# Patient Record
Sex: Male | Born: 1955 | Race: White | Hispanic: No | Marital: Married | Smoking: Never smoker
Health system: Southern US, Community
[De-identification: ages and names within clinical notes are randomized; demographics above are authoritative.]

## PROBLEM LIST (undated history)

## (undated) DIAGNOSIS — E785 Hyperlipidemia, unspecified: Secondary | ICD-10-CM

## (undated) DIAGNOSIS — I2510 Atherosclerotic heart disease of native coronary artery without angina pectoris: Secondary | ICD-10-CM

## (undated) DIAGNOSIS — R001 Bradycardia, unspecified: Secondary | ICD-10-CM

## (undated) DIAGNOSIS — G4730 Sleep apnea, unspecified: Secondary | ICD-10-CM

## (undated) DIAGNOSIS — I509 Heart failure, unspecified: Secondary | ICD-10-CM

## (undated) DIAGNOSIS — K219 Gastro-esophageal reflux disease without esophagitis: Secondary | ICD-10-CM

## (undated) DIAGNOSIS — Z9989 Dependence on other enabling machines and devices: Secondary | ICD-10-CM

## (undated) DIAGNOSIS — I10 Essential (primary) hypertension: Secondary | ICD-10-CM

## (undated) DIAGNOSIS — T7840XA Allergy, unspecified, initial encounter: Secondary | ICD-10-CM

## (undated) DIAGNOSIS — G4733 Obstructive sleep apnea (adult) (pediatric): Secondary | ICD-10-CM

## (undated) DIAGNOSIS — I219 Acute myocardial infarction, unspecified: Secondary | ICD-10-CM

## (undated) DIAGNOSIS — Z8719 Personal history of other diseases of the digestive system: Secondary | ICD-10-CM

## (undated) DIAGNOSIS — E7800 Pure hypercholesterolemia, unspecified: Secondary | ICD-10-CM

## (undated) HISTORY — PX: CORONARY ARTERY BYPASS GRAFT: SHX141

## (undated) HISTORY — DX: Personal history of other diseases of the digestive system: Z87.19

## (undated) HISTORY — DX: Atherosclerotic heart disease of native coronary artery without angina pectoris: I25.10

## (undated) HISTORY — PX: APPENDECTOMY: SHX54

## (undated) HISTORY — DX: Bradycardia, unspecified: R00.1

## (undated) HISTORY — DX: Heart failure, unspecified: I50.9

## (undated) HISTORY — DX: Obstructive sleep apnea (adult) (pediatric): G47.33

## (undated) HISTORY — DX: Gastro-esophageal reflux disease without esophagitis: K21.9

## (undated) HISTORY — DX: Allergy, unspecified, initial encounter: T78.40XA

## (undated) HISTORY — PX: CARDIAC CATHETERIZATION: SHX172

## (undated) HISTORY — DX: Pure hypercholesterolemia, unspecified: E78.00

## (undated) HISTORY — DX: Obstructive sleep apnea (adult) (pediatric): Z99.89

## (undated) HISTORY — PX: TONSILLECTOMY: SHX5217

## (undated) HISTORY — DX: Sleep apnea, unspecified: G47.30

## (undated) HISTORY — DX: Hyperlipidemia, unspecified: E78.5

## (undated) HISTORY — DX: Essential (primary) hypertension: I10

## (undated) HISTORY — DX: Acute myocardial infarction, unspecified: I21.9

## (undated) HISTORY — PX: SHOULDER SURGERY: SHX246

---

## 2001-06-07 ENCOUNTER — Encounter: Admission: RE | Admit: 2001-06-07 | Discharge: 2001-06-07 | Payer: Self-pay | Admitting: Infectious Diseases

## 2001-06-14 ENCOUNTER — Encounter: Admission: RE | Admit: 2001-06-14 | Discharge: 2001-06-14 | Payer: Self-pay | Admitting: Infectious Diseases

## 2001-06-21 ENCOUNTER — Encounter: Admission: RE | Admit: 2001-06-21 | Discharge: 2001-06-21 | Payer: Self-pay | Admitting: Infectious Diseases

## 2004-10-23 HISTORY — PX: KNEE SURGERY: SHX244

## 2006-10-23 DIAGNOSIS — I219 Acute myocardial infarction, unspecified: Secondary | ICD-10-CM

## 2006-10-23 HISTORY — DX: Acute myocardial infarction, unspecified: I21.9

## 2007-04-22 ENCOUNTER — Ambulatory Visit: Payer: Self-pay | Admitting: Internal Medicine

## 2007-06-14 ENCOUNTER — Ambulatory Visit (HOSPITAL_BASED_OUTPATIENT_CLINIC_OR_DEPARTMENT_OTHER): Admission: RE | Admit: 2007-06-14 | Discharge: 2007-06-14 | Payer: Self-pay | Admitting: Internal Medicine

## 2007-06-22 ENCOUNTER — Ambulatory Visit: Payer: Self-pay | Admitting: Internal Medicine

## 2007-06-26 ENCOUNTER — Ambulatory Visit: Payer: Self-pay | Admitting: Internal Medicine

## 2007-07-28 ENCOUNTER — Inpatient Hospital Stay (HOSPITAL_COMMUNITY): Admission: EM | Admit: 2007-07-28 | Discharge: 2007-08-07 | Payer: Self-pay | Admitting: Emergency Medicine

## 2007-07-29 ENCOUNTER — Encounter: Payer: Self-pay | Admitting: Cardiovascular Disease

## 2007-07-29 ENCOUNTER — Ambulatory Visit: Payer: Self-pay | Admitting: Thoracic Surgery (Cardiothoracic Vascular Surgery)

## 2007-08-01 ENCOUNTER — Encounter: Payer: Self-pay | Admitting: Thoracic Surgery (Cardiothoracic Vascular Surgery)

## 2007-08-19 ENCOUNTER — Ambulatory Visit: Payer: Self-pay | Admitting: Thoracic Surgery (Cardiothoracic Vascular Surgery)

## 2007-08-19 ENCOUNTER — Encounter
Admission: RE | Admit: 2007-08-19 | Discharge: 2007-08-19 | Payer: Self-pay | Admitting: Thoracic Surgery (Cardiothoracic Vascular Surgery)

## 2007-08-27 ENCOUNTER — Ambulatory Visit: Payer: Self-pay | Admitting: Internal Medicine

## 2007-08-29 ENCOUNTER — Encounter (HOSPITAL_COMMUNITY): Admission: RE | Admit: 2007-08-29 | Discharge: 2007-10-23 | Payer: Self-pay | Admitting: Cardiovascular Disease

## 2007-10-15 ENCOUNTER — Encounter: Payer: Self-pay | Admitting: Internal Medicine

## 2007-10-24 ENCOUNTER — Encounter (HOSPITAL_COMMUNITY): Admission: RE | Admit: 2007-10-24 | Discharge: 2008-01-22 | Payer: Self-pay | Admitting: Cardiovascular Disease

## 2007-10-25 HISTORY — PX: US ECHOCARDIOGRAPHY: HXRAD669

## 2007-11-26 DIAGNOSIS — J3089 Other allergic rhinitis: Secondary | ICD-10-CM

## 2007-11-26 DIAGNOSIS — E785 Hyperlipidemia, unspecified: Secondary | ICD-10-CM | POA: Insufficient documentation

## 2007-11-26 DIAGNOSIS — I2510 Atherosclerotic heart disease of native coronary artery without angina pectoris: Secondary | ICD-10-CM | POA: Insufficient documentation

## 2007-11-26 DIAGNOSIS — J302 Other seasonal allergic rhinitis: Secondary | ICD-10-CM | POA: Insufficient documentation

## 2007-11-26 DIAGNOSIS — G4733 Obstructive sleep apnea (adult) (pediatric): Secondary | ICD-10-CM | POA: Insufficient documentation

## 2007-11-26 DIAGNOSIS — I10 Essential (primary) hypertension: Secondary | ICD-10-CM

## 2007-11-27 ENCOUNTER — Ambulatory Visit: Payer: Self-pay | Admitting: Internal Medicine

## 2008-04-09 ENCOUNTER — Ambulatory Visit: Payer: Self-pay | Admitting: Internal Medicine

## 2008-08-07 ENCOUNTER — Telehealth: Payer: Self-pay | Admitting: Internal Medicine

## 2008-09-01 ENCOUNTER — Ambulatory Visit (HOSPITAL_COMMUNITY): Admission: RE | Admit: 2008-09-01 | Discharge: 2008-09-01 | Payer: Self-pay | Admitting: Orthopaedic Surgery

## 2008-09-03 IMAGING — CR DG CHEST 1V PORT
1 series · 1 of 1 positions shown · non-contrast
Comparison: 07/28/07

CLINICAL DATA: Unstable angina, rule out heart failure. 
 PORTABLE CHEST ? 1 VIEW ? 3708 hours:

[view not recorded]
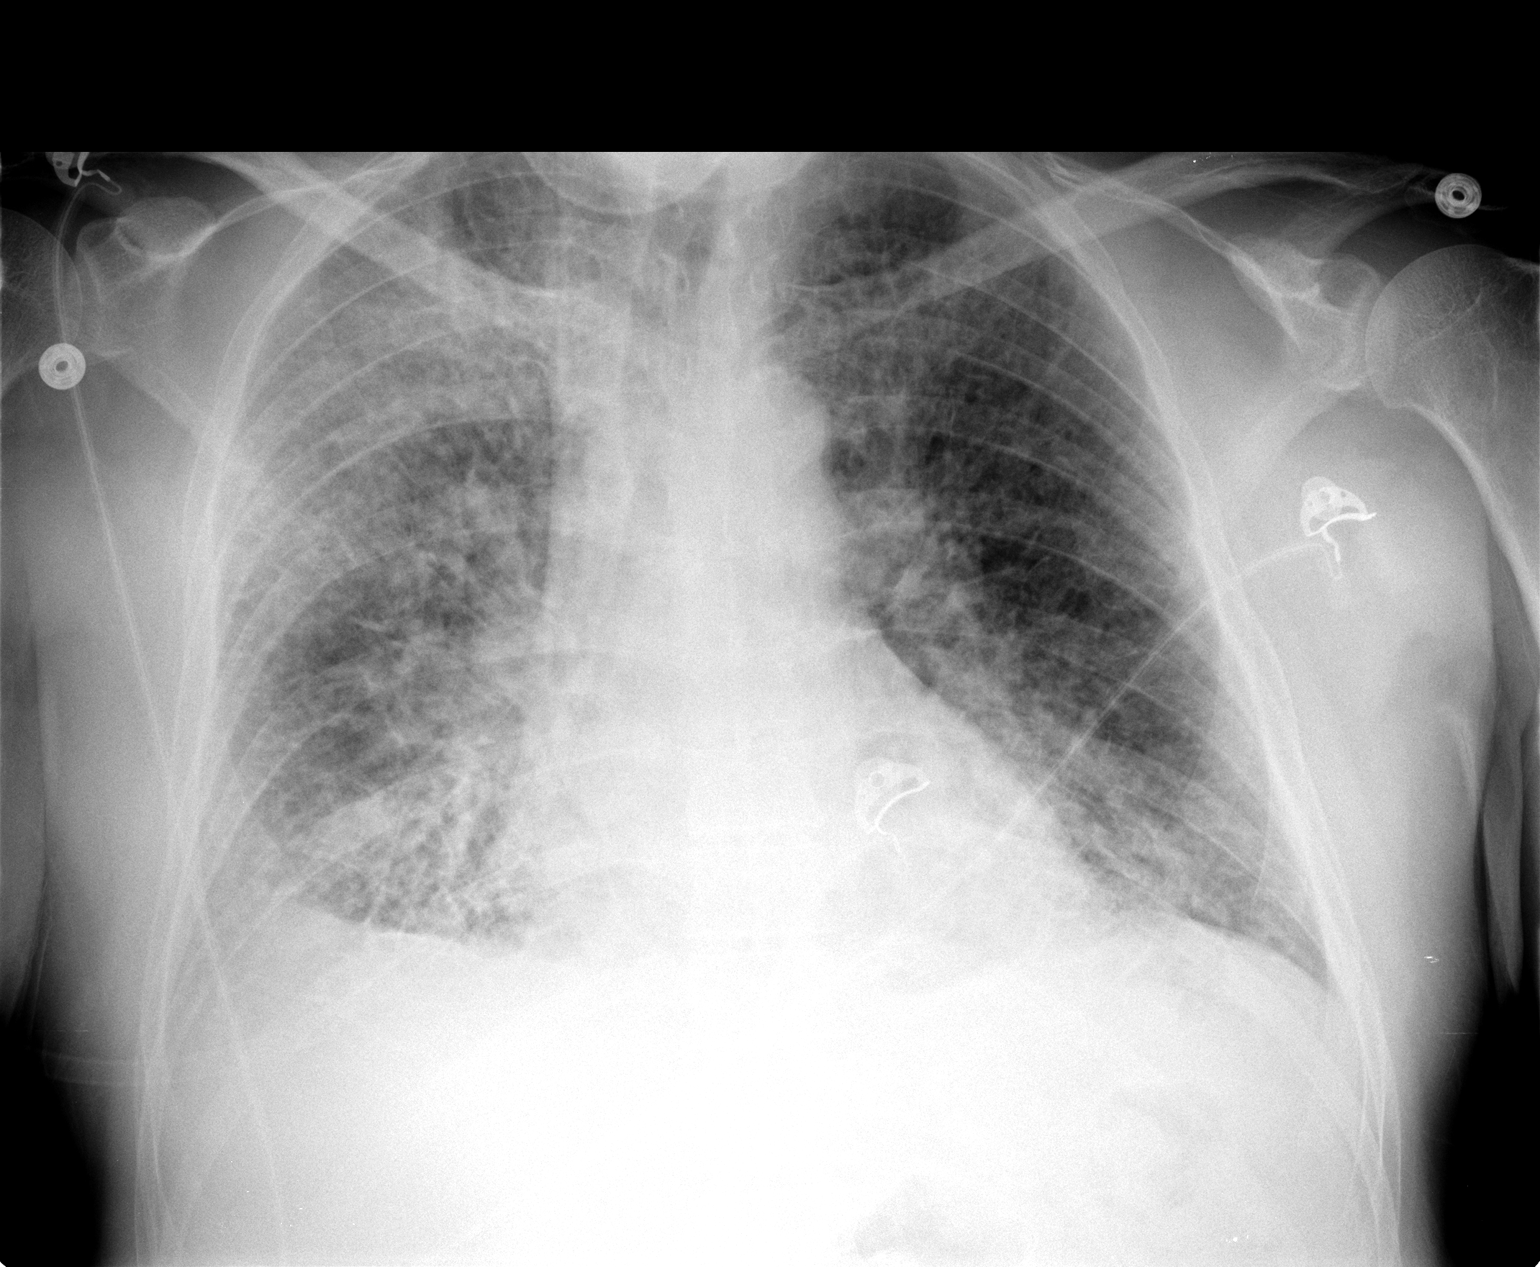

[1 of 1 positions shown; findings below may reference images not displayed]

FINDINGS: Artifact overlies the chest.  The patient has interstitial and alveolar pulmonary edema asymmetrically more pronounced on the right.  There are small pleural effusions bilaterally.
IMPRESSION: Acute pulmonary edema.  Small pleural effusions.

## 2008-09-04 IMAGING — CR DG CHEST 1V PORT
1 series · 1 of 1 positions shown · non-contrast
Comparison: One day prior

CLINICAL DATA: Unstable angina. Rule out CHF.

CHEST - 1 VIEW

[view not recorded]
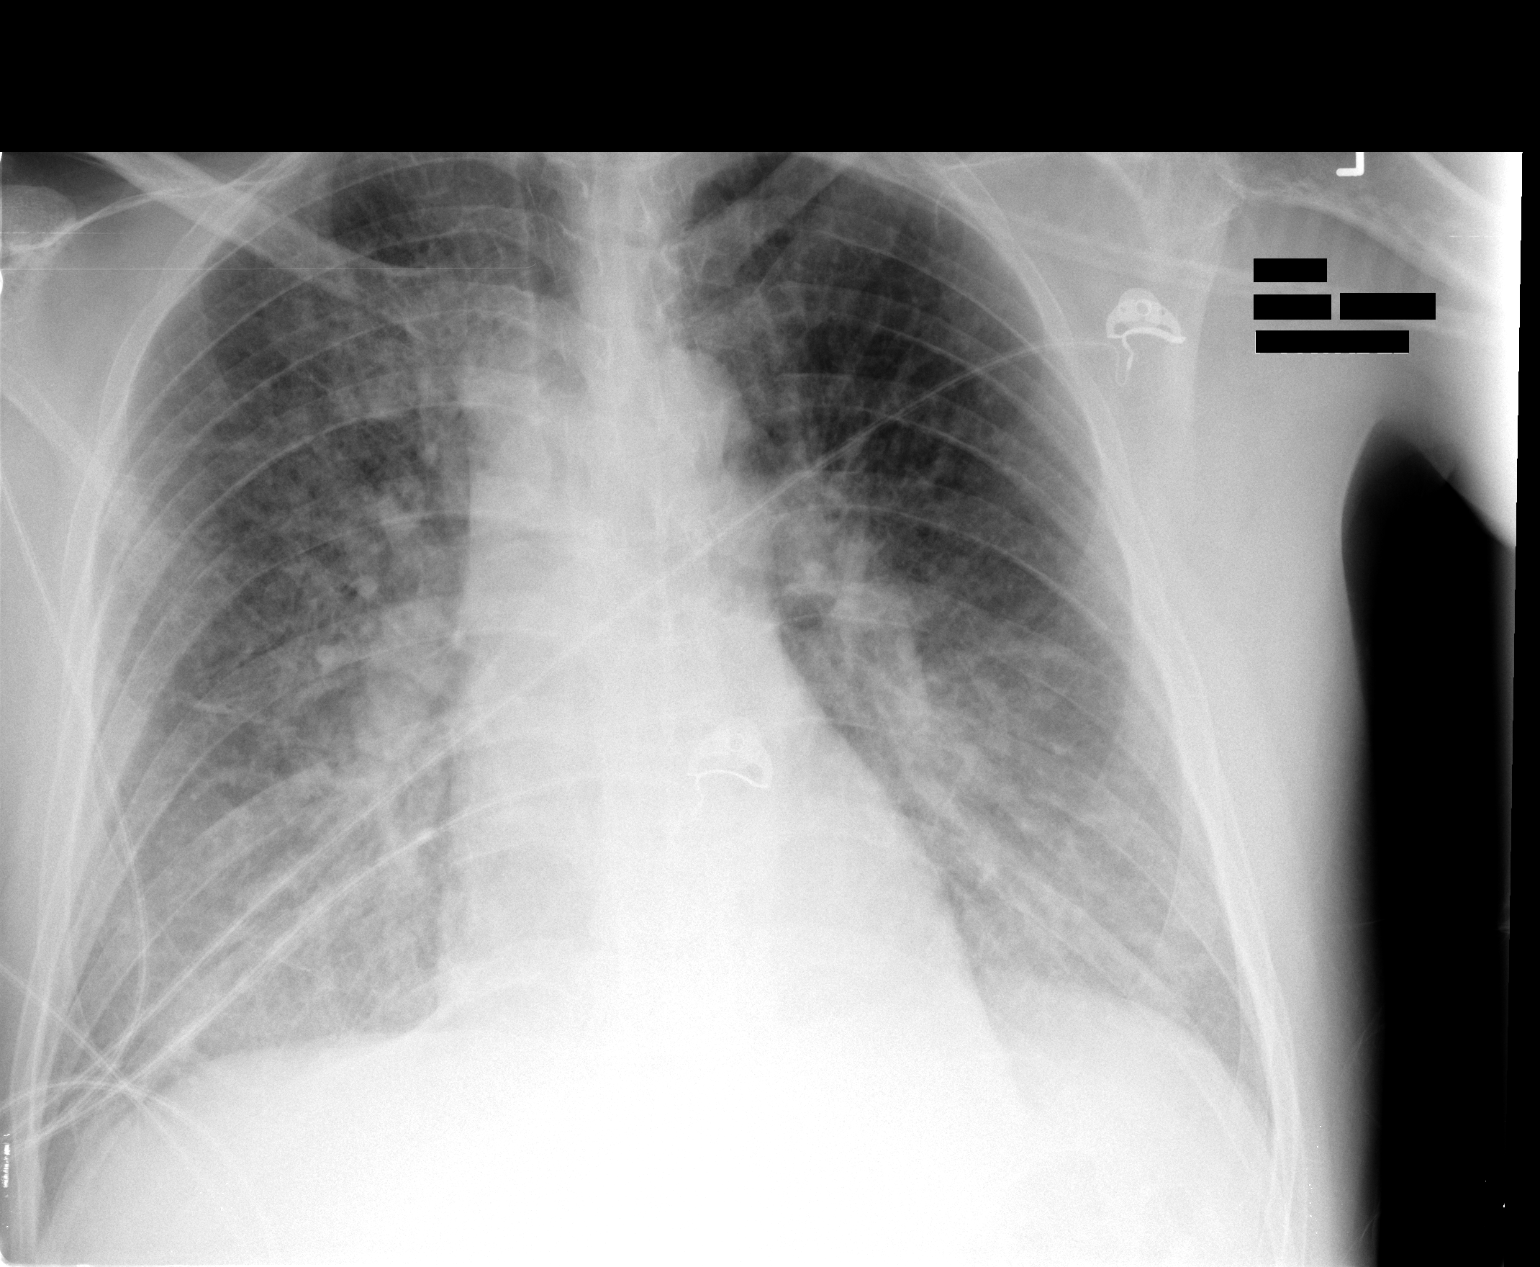

[1 of 1 positions shown; findings below may reference images not displayed]

FINDINGS: Midline trachea. Normal heart size and mediastinal contours. Probable
small layering bilateral pleural effusions. No pneumothorax.

Improved mild interstitial edema. Minimal left base airspace disease is likely
atelectasis.

IMPRESSION

1. Slight improvement in now mild interstitial edema.
2. Probable layering bilateral pleural effusions.
3. Minimal left base airspace disease, likely atelectasis.

## 2008-09-05 IMAGING — CR DG CHEST 2V
2 series · 2 of 2 positions shown · non-contrast
Comparison: 07/31/07.

CLINICAL DATA: Pre-CABG. History of MI. Now with shortness of breath.
 CHEST - 2 VIEW:

[w chest pa]
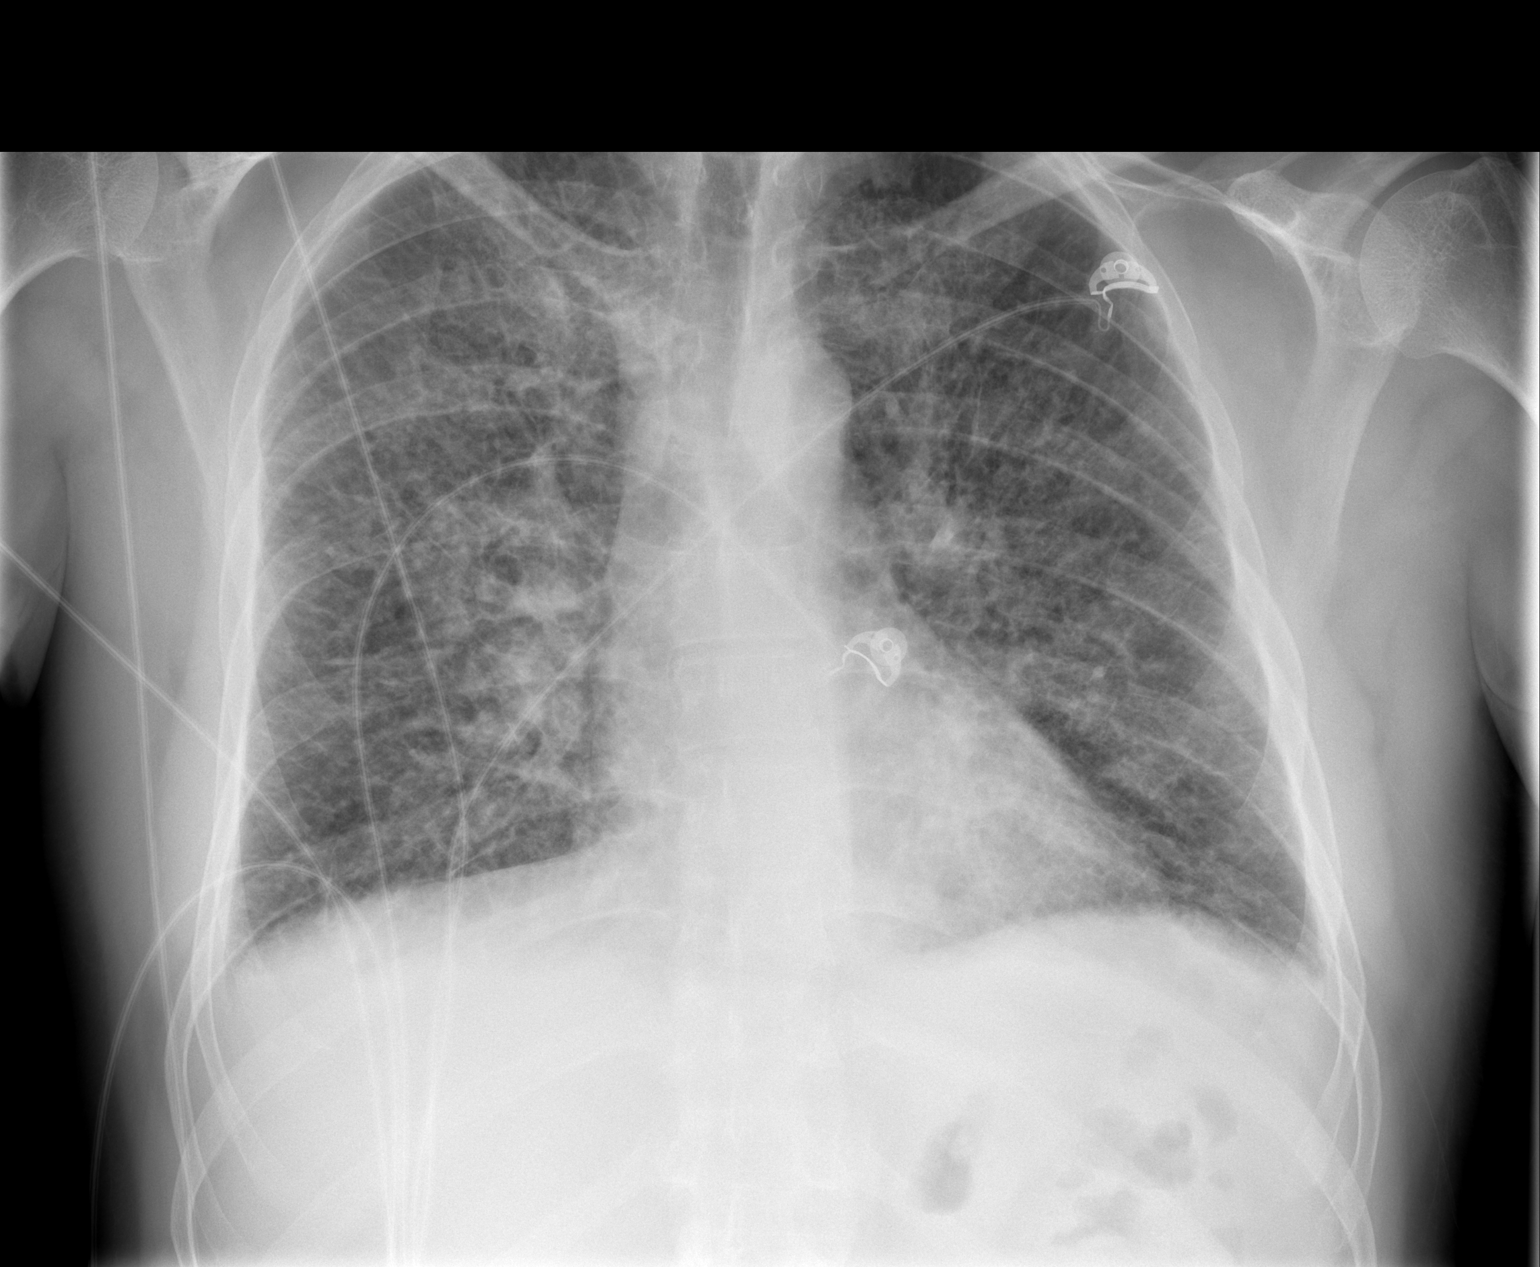

[w chest lat]
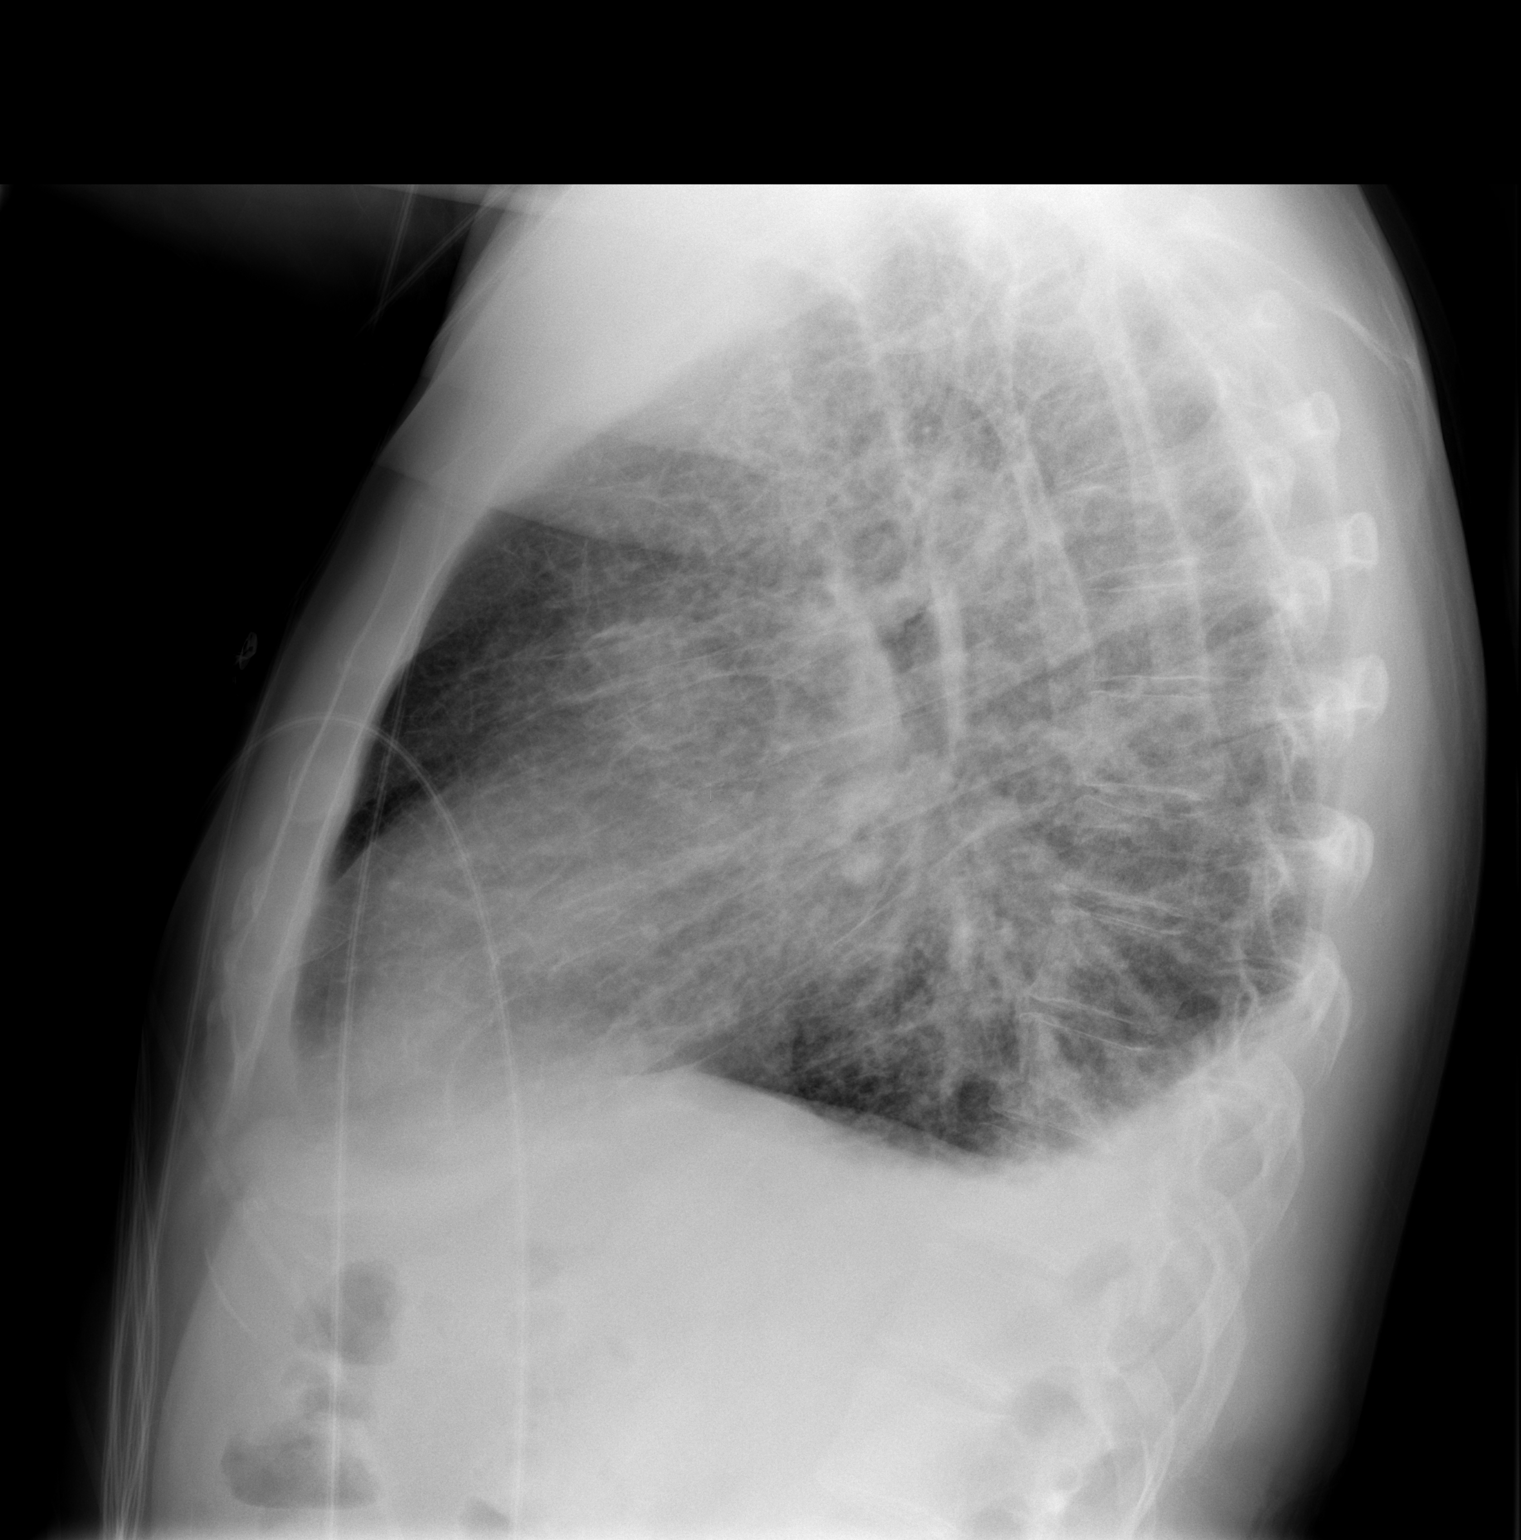

[2 of 2 positions shown; findings below may reference images not displayed]

FINDINGS: Heart size is normal. 
 There are small to moderate-sized bilateral pleural effusions. 
 Interstitial edema pattern has increased in the interval.
IMPRESSION: Worsening congestive heart failure.

## 2008-09-05 IMAGING — CR DG CHEST 1V PORT
1 series · 1 of 1 positions shown · non-contrast
Comparison: 08/01/2007, 07/31/2007

CLINICAL DATA: 51-year-old male, unstable angina.  Status post coronary
bypass.  Congestive heart failure. 
PORTABLE CHEST - 08/01/2007:

[view not recorded]
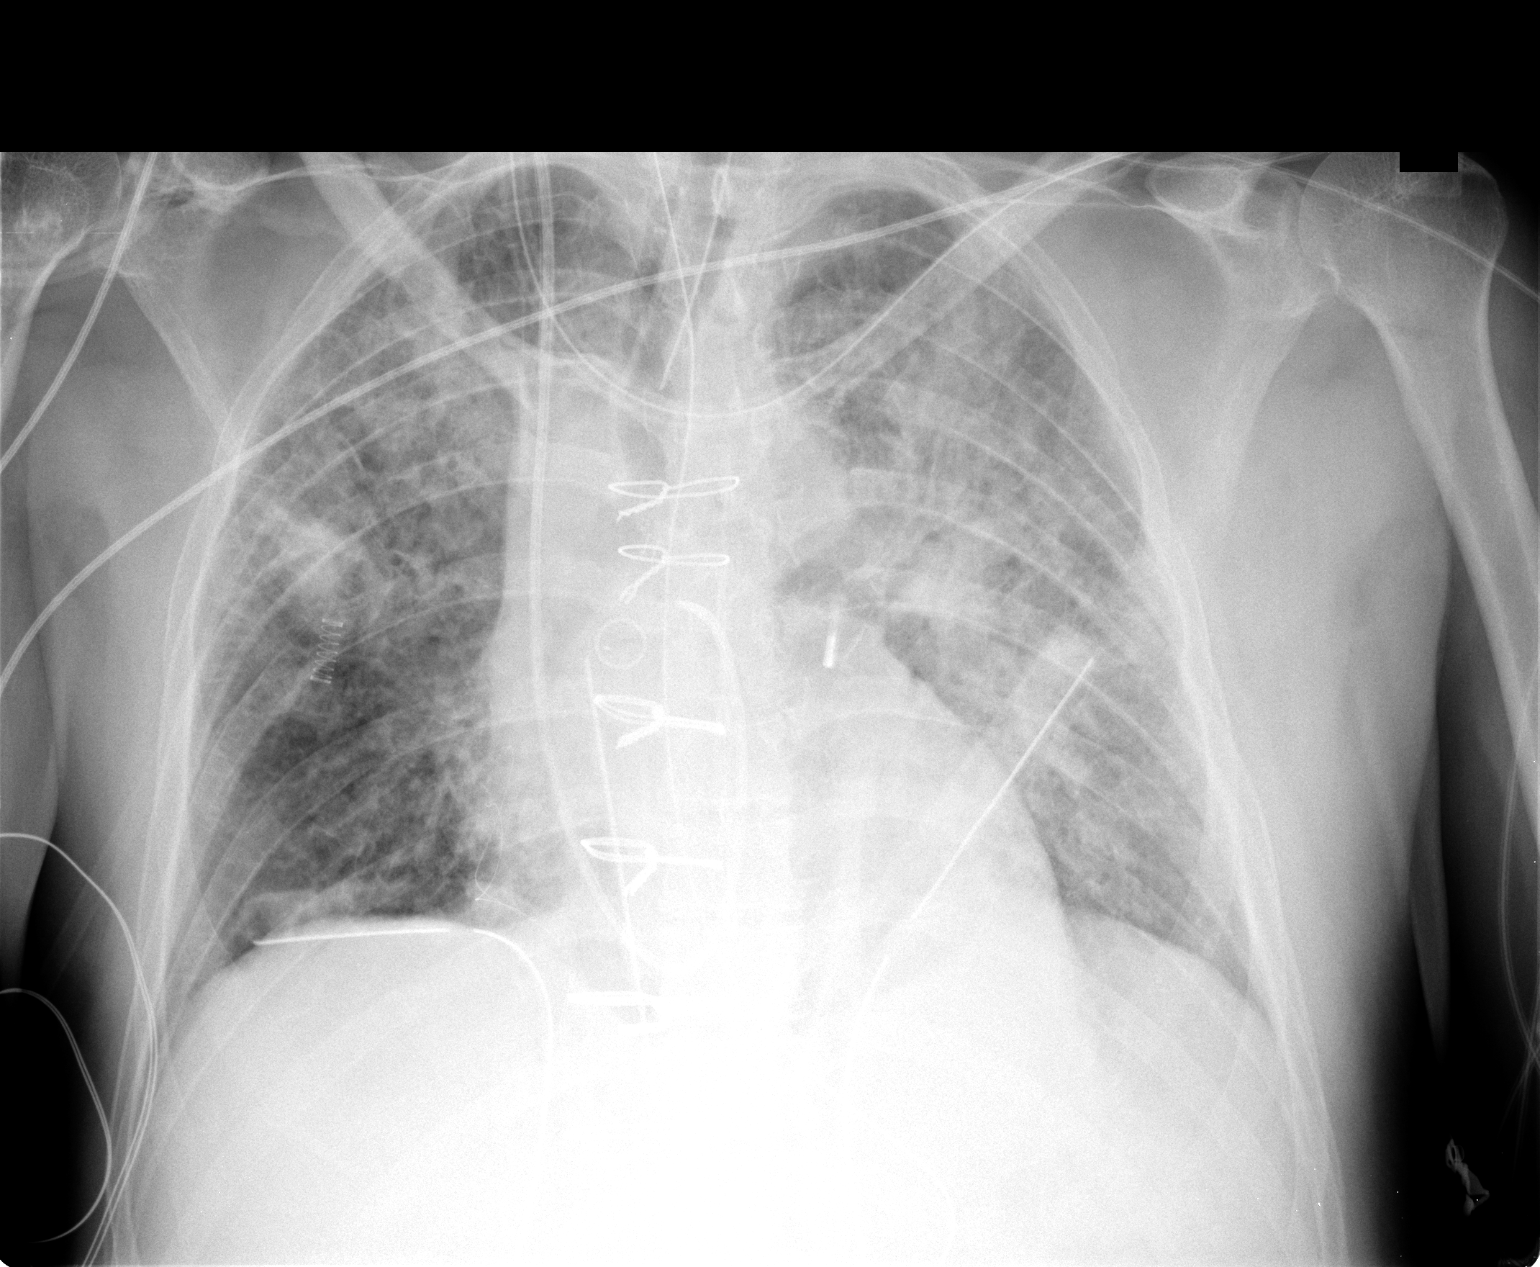

[1 of 1 positions shown; findings below may reference images not displayed]

FINDINGS: Endotracheal tube is 4.6 cm above the carina.  Swan-Ganz catheter tip is in the pulmonary outflow tract in the midline directed to the right. Mediastinal drains and chest tubes are noted.  NG tube enters the stomach with the tip not visualized.  Intraaortic balloon pump radiopaque marker is in the descending thoracic aorta.  Heart is enlarged with slight worsening airspace disease versus edema.  CHF is favored.  No large effusion or pneumothorax.
IMPRESSION: 1.  Support apparatus in good position.  
2.  Slight worsening CHF.

## 2008-10-09 ENCOUNTER — Ambulatory Visit: Payer: Self-pay | Admitting: Internal Medicine

## 2010-08-03 ENCOUNTER — Ambulatory Visit: Payer: Self-pay | Admitting: Cardiovascular Disease

## 2010-11-13 ENCOUNTER — Encounter: Payer: Self-pay | Admitting: Thoracic Surgery (Cardiothoracic Vascular Surgery)

## 2011-03-07 NOTE — Cardiovascular Report (Signed)
[name], [name] NO.:  [id number]   MEDICAL RECORD NO.:  [id number]          PATIENT TYPE:  INP   LOCATION:  2913                         FACILITY:  [facility]   PHYSICIAN:  Vesta Mixer, M.D. DATE OF BIRTH:  01/11/1956   DATE OF PROCEDURE:  07/29/2007  DATE OF DISCHARGE:                            CARDIAC CATHETERIZATION   [name] is a 55 year old gentleman who was seen in the office on  Friday with 2 weeks of intermittent arm pain.  He was scheduled to have  a stress test as an outpatient.  He presented on October 5 with sudden  onset of chest discomfort and arm pain.  He was found to have EKG  changes consistent with an anterior wall myocardial infarction.  He  became largely pain free on heparin, Integrilin and nitroglycerin.   He is referred for heart catheterization and for further evaluation.   PROCEDURE:  The procedure was left heart catheterization with coronary  angiography.   The right femoral artery was easily cannulated using a modified  Seldinger technique.   HEMODYNAMIC RESULTS:  The LV pressures were 88/34 with an aortic  pressure of 89/68.   ANGIOGRAPHY:  Left main.  The left main is essentially normal.   The left anterior descending artery has a proximal occlusion.  The the  plaque in the thrombus extends back into the left main.  The mid and  distal LAD filled, via right-to-left collaterals.   Left circumflex artery is a moderate-to-small sized vessel.  There  appears to be some ostial stenosis of about 60% at the origin.  The  first obtuse marginal artery has an 80% discrete stenosis at its origin.  The remainder of the circumflex artery has minor luminal irregularities.   The right coronary artery is large and dominant.  The posterior  descending artery is normal.  The posterolateral segment artery is  normal.   There are fairly good collateral vessels that extend up through the  septal branches to fill the mid and distal  LAD.   Left ventriculogram was performed in a 30 RAO position.  It reveals  anteroapical akinesis.  The ejection fraction is 30% to 35% percent.   COMPLICATIONS:  None.   CONCLUSIONS:  1. Coronary artery disease.  He has a 100% occluded left anterior      descending artery.  The plaque and the thrombus extends back into      the left main, and I worry that proceeding with angioplasty may      cause some disruption of this plaque, and pulling it back down to      into the circumflex artery.  He does have collateral      filling of the LAD, and I suspect that this territory still viable.      We will consult the surgeons for consideration for coronary artery      bypass grafting.   His left ventricular end-diastolic pressure is elevated.  We will give  him some diuresis.           ______________________________  Vesta Mixer, M.D.     PJN/MEDQ  D:  07/29/2007  T:  07/29/2007  Job:  [id number]   cc:   Salvatore Decent. Dorris Fetch, M.D.

## 2011-03-07 NOTE — Assessment & Plan Note (Signed)
[facility]                             PULMONARY OFFICE NOTE   NAME:CAPRANathan, Moctezuma                     MRN:          [id number]  DATE:08/27/2007                            DOB:          [birth year]    PROBLEMS:  1. Obstructive sleep apnea.  2. Elevated cholesterol.  3. Hypertension.  4. Seasonal rhinitis.  5. Coronary disease/infarction/bypass.   HISTORY:  He had started CPAP at 9 CWP and was getting comfortable with  that and then developed chest pain. He had an MI with pulmonary edema  and bypass graft. Since surgery, he has not been able to put his CPAP  back on because on incisional sternal discomfort, which we discussed. He  is about to start cardiac rehab.   MEDICATIONS:  1. Lipitor 80 mg.  2. Potassium 20 mEq.  3. Protonix 40 mg.  4. Coreg 3.125 mg.  5. Lasix 40 mg.  6. Altace 2.5 mg.  7. Iron 325 mg b.i.d.  8. Aspirin 325 mg.   ALLERGIES:  No medication allergy.   OBJECTIVE:  Weight 184 pounds, blood pressure 102/60, pulse 95, room air  saturation 100%. He is alert. Breathing is unlabored. Nasal airway is  not obstructed. Pulse is regular without murmur.   IMPRESSION:  Obstructive sleep apnea. He needs to get back on CPAP and  this was discussed. We may need to turn the pressure down a little bit  until his sternum feels more solid.   PLAN:  He will resume CPAP as able at 9 CWP. Schedule return 3 months.  Flu vaccine discussed and given.     Clinton D. Maple Hudson, MD, Tonny Bollman, FACP  Electronically Signed    CDY/MedQ  DD: 08/31/2007  DT: 09/01/2007  Job #: [id number]   cc:   Talmadge Coventry, M.D.  Vesta Mixer, M.D.

## 2011-03-07 NOTE — H&P (Signed)
[name], [name] NO.:  [id number]   MEDICAL RECORD NO.:  [id number]          PATIENT TYPE:  INP   LOCATION:  2913                         FACILITY:  [facility]   PHYSICIAN:  Cassell Clement, M.D. DATE OF BIRTH:  [birth year]   DATE OF ADMISSION:  07/28/2007  DATE OF DISCHARGE:                              HISTORY & PHYSICAL   CHIEF COMPLAINT:  Chest pain.   HISTORY:  This is a 55 year old Caucasian male admitted with chest pain.  The patient began having intermittent chest pain about 2 weeks ago.  He  had a bad episode of chest pain 4 days ago while walking the dog.  Two  days ago, July 26, 2007, he saw Dr. Elease Hashimoto in the office, and  electrocardiogram at that time was normal, but he was scheduled to have  a nuclear stress test this coming week.  However, yesterday morning he  began having chest pain and it lasted off and on all day, and last night  he did not sleep at all because of severe chest pain.  The patient did  not have any nausea or vomiting but did have bilateral arm paresthesias.  Because of persistence of the pain, the patient allowed his wife to  drive him to the emergency room after she got home from church today,  and in the emergency room his electrocardiogram showed new ST-segment  changes suggestive of a recent anteroseptal myocardial infarction.  In  the emergency room, he was given 4 baby aspirin.  The pain has eased  since arrival in the emergency room.  Cardiac enzymes in the emergency  room are markedly elevated, including a total CK of 1648, CK-MB of 216,  troponin of 11.38.  His SGOT is also elevated at 157.  Electrolytes are  slightly abnormal, with sodium of 133, potassium 3.3, BUN of 15,  creatinine 0.97.   SOCIAL HISTORY:  Reveals that the patient is married.  He has 1 child.  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  The patient has  never smoked, and he rarely drinks any alcohol.   PAST MEDICAL HISTORY:  Reveals  that he has a history of elevated  cholesterol and has been on Lipitor or 40 mg daily for the past year.   PAST SURGICAL HISTORY:  1. History of tonsillectomy and appendectomy prior to his moving to      the [place] from [place] at age 73.   ALLERGIES:  He has no known drug allergies.   REVIEW OF SYSTEMS:  GASTROINTESTINAL:  he does have a history of  frequent heartburn and uses Protonix p.r.n.  Bowel movements are  generally normal, but has had no bowel movement for the past 48 hours.  He has had a very poor appetite for the past 48 hours, and he has eaten  very little.  GENITOURINARY:  Reveals no bladder symptoms.  RESPIRATORY:  Reveals that he does use a CPAP machine at home for sleep  apnea.  He had a history of pneumonia 15 years ago.  He does not have  any history of arthritis.  He does have  a history of exertional chest  pain, as noted.  Interestingly, he was seen for palpitations in May 2008  and had a treadmill test then which apparently was normal.   FAMILY HISTORY:  His father died at age 73 of a heart attack.  Mother is  living at age 53 and lives in [place] and has no heart disease.  The  patient has several siblings, none of whom have heart problems.   HOME MEDICATIONS:  1. Lipitor 40 mg daily.  2. Protonix 40 mg p.r.n.  3. CPAP machine.  4. He does not take a daily aspirin.   PHYSICAL EXAMINATION:  VITAL SIGNS:  Blood pressure is 114/64, pulse 75  and regular, respirations are normal.  SKIN:  Color is somewhat sallow and pale.  Skin warm and dry.  HEAD AND NECK:  No scleral icterus.  Pupils equal and reactive.  Mouth  and pharynx normal.  Jugular venous pressure normal.  Carotids normal.  CHEST:  Clear to percussion and auscultation.  HEART:  Reveals no murmur, gallop, rub, or click.  ABDOMEN:  Soft and nontender.  No masses.  EXTREMITIES:  Show good pedal pulses, and no phlebitis or edema.   His EKG shows normal sinus rhythm, with evidence of a recent   anteroseptal MI.  Chest x-ray shows normal heart size and clear lung  fields.   IMPRESSION:  1. Recent anteroseptal myocardial infarction.  2. History of hypercholesterolemia.   DISPOSITION:  The patient is being admitted to the coronary care unit as  a recent myocardial infarction which occurred probably 24 hours ago.  Enzymes are significantly elevated.  The patient will be treated with IV  nitroglycerin, IV heparin, IV Integrilin, and aspirin.  He will also be  on beta blockers and statin therapy.  We will anticipate cardiac  catheterization Monday morning by Dr. Kristeen Miss.           ______________________________  Cassell Clement, M.D.     TB/MEDQ  D:  07/28/2007  T:  07/29/2007  Job:  [id number]   cc:   Talmadge Coventry, M.D.  Vesta Mixer, M.D.

## 2011-03-07 NOTE — Consult Note (Signed)
[name], [name]            ACCOUNT NO.:  [id number]   MEDICAL RECORD NO.:  [id number]          PATIENT TYPE:  INP   LOCATION:  2913                         FACILITY:  [facility]   PHYSICIAN:  Salvatore Decent. Dorris Fetch, M.D.DATE OF BIRTH:  1956/09/29   DATE OF CONSULTATION:  DATE OF DISCHARGE:                                 CONSULTATION   HISTORY OF PRESENT ILLNESS:  [name] is a 55 year old gentleman  originally from [place] who recently moved here from [place], [place].  He currently works as a Arboriculturist at [employer]. He has a  family history of coronary disease and a history of  hypercholesterolemia, but no prior cardiac history.  He had been having  intermittent chest pain over the past 2 weeks. He had seen Dr. Elease Hashimoto in  the office and was scheduled have a nuclear stress test this week.  However, Saturday night he slept poorly secondary to chest pain which  had been occurring on off all day on Saturday.  Sunday morning, he still  had the pain and also was complaining of some bilateral arm  paresthesias.  He came to the emergency room on the afternoon of July 28, 2007.  The EKG showed new ST abnormalities consistent with a recent  anteroseptal MI.  He was given aspirin.  His pain subsided.  He ruled in  for a myocardial infarction with a CPK of 1648, an MB of 216, and a  troponin of 11.38.  His troponin eventually rose to 23.  Today, he  underwent cardiac catheterization, where he was found to have a totally  occluded LAD.  There was some filling via right-to-left collaterals.  He  had a large dominant right coronary which did not have any significant  stenoses.  His left circumflex had a 70% ostial stenosis and an 80%  stenosis at the ostium of obtuse marginal 1.  He was noted to have an  elevated LVEDP at 34.  His ejection fraction was 30%, with anterior  apical akinesis.  The patient has been pain free since catheterization.   PAST MEDICAL HISTORY:  1.  Hypercholesterolemia.  2. Tonsillectomy.  3. Appendectomy.  4. Indigestion.   MEDICATIONS AT THE TIME OF ADMISSION:  1. Protonix 40 mg daily.  2. Lipitor 40 mg daily.   He has no known drug allergies.   FAMILY HISTORY:  Significant for father dying of a heart attack at age  29.   SOCIAL HISTORY:  He is married, with one child.  He had does not smoke.  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   REVIEW OF SYSTEMS:  He has had frequent heartburn.  Poor appetite since  Friday.  He had pneumonia 15 years ago, and uses CPAP.  He has no  history of blood clots, no history of claudication.  No stroke or TIA  symptoms.  No history of excessive bleeding or bruising.  All other  systems were negative.   PHYSICAL EXAMINATION:  GENERAL:  [name] is a 55 year old white male  in no acute distress.  VITAL SIGNS:  His blood pressure  is 100/60, pulse is 72 and regular,  respirations are 16.  GENERAL:  He is well developed and well nourished.  NEUROLOGIC:  He is alert and oriented x3.  He is appropriate and grossly  intact.  HEENT:  Unremarkable.  NECK:  Supple, without thyromegaly, adenopathy, or bruits.  CARDIAC:  Regular rate and rhythm.  Normal S1 and S2.  There is no S4.  There is no murmur.  LUNGS:  Clear anteriorly, with no wheezing.  ABDOMEN:  Soft, nontender.  EXTREMITIES:  Without clubbing, cyanosis, or edema.  He has 2+ pulses  throughout.  He has a normal Allen's test on the right.  He is left  handed.  SKIN:  Warm and dry.   LABORATORY DATA:  His hematocrit is 38, white count 12.1, platelets 235.  Sodium 136, potassium 3.4, BUN and creatinine 13 and 0.9, glucose is  123.  PT was 14.4, with an INR of 1.1, PTT was greater than 200 on  heparin.  His total bilirubin is 2.2, which is elevated.  AST is  elevated at 162.  Albumin is 3.2.  His peak CPK is 1689, peak MB is  153.7, peak troponin 23.9.  His cholesterol is 128, LDL 72, HDL was 46,  triglycerides were  50.  BNP was 1034.   Carotid Dopplers showed no evidence of ICA stenosis.  Normal Allen's  test bilaterally.   IMPRESSION:  [name] is a 55 year old gentleman who presented with an  out-of-hospital MI.  At catheterization, he has a total occlusion of his  LAD.  He also has some disease in his left circumflex system.  He has an  ejection fraction of 30%, with anteroapical akinesis.  He has had a  major infarction judging by enzymes as well as the amount of myocardium  which is nonfunctional on catheterization.  This is complicated by  congestive heart failure with elevated LVEDP and elevated BNP.  He  currently is pain free on Integrilin, heparin, and nitroglycerin.   I agree with Dr. Elease Hashimoto that [name] would benefit from coronary  artery bypass grafting to revascularize the LAD territory.  There is no  doubt there is significant scar, but there is likely some viable  myocardium there as well.  In addition, he would need revascularization  of his left circumflex distribution.  He has had a completed MI and  likely has a significant area of stunned myocardium in addition to the  infarcted myocardium.  He would certainly benefit from an interval to  allow some recovery of the stunned myocardium prior to coronary bypass  grafting if his symptoms per minute.  I have discussed with Mr. [name]  the indications, risks, benefits, and alternative treatments.  He  understands the risks include but are not limited to death, stroke, MI,  DVT, PE, bleeding, possible need for transfusions, infections, as well  as other organ system dysfunction, including respiratory, renal, or GI  complications.  We also discussed the timing of the surgery, potential  use of the right radial artery, given his young age.  I attempted to  call Dr. Elease Hashimoto, but he is currently occupied with an acute cardiac  catheterization, but again I think that [name] would benefit if his  symptoms permit from an interval prior  to coronary artery bypass  grafting.      Salvatore Decent Dorris Fetch, M.D.  Electronically Signed     SCH/MEDQ  D:  07/29/2007  T:  07/30/2007  Job:  [id number]  cc:   Vesta Mixer, M.D.  Talmadge Coventry, M.D.

## 2011-03-07 NOTE — Op Note (Signed)
NAMEJESSICA, Huff            ACCOUNT NO.:  [id number]   MEDICAL RECORD NO.:  [id number]          PATIENT TYPE:  INP   LOCATION:  2316                         FACILITY:  [facility]   PHYSICIAN:  Salvatore Decent. Dorris Fetch, M.D.DATE OF BIRTH:  1956/09/16   DATE OF PROCEDURE:  08/01/2007  DATE OF DISCHARGE:                               OPERATIVE REPORT   PREOPERATIVE DIAGNOSIS:  Severe two-vessel coronary disease, status post  acute myocardial infarction.   POSTOPERATIVE DIAGNOSIS:  Severe two-vessel coronary disease, status  post acute myocardial infarction.   PROCEDURES:  1. Median sternotomy.  2. Extracorporeal circulation.  3. Coronary bypass grafting x3 (left internal mammary artery to left      anterior descending artery, saphenous vein graft sequentially to      obtuse marginal one and two).  4. Endoscopic vein harvest, right thigh.  5. Placement of intra-aortic balloon pump.   SURGEON:  Salvatore Decent. Dorris Fetch, M.D.   FIRST ASSISTANT:  Rowe Clack, P.A.-C.   SECOND ASSISTANT:  Katherene Ponto.   ANESTHESIA:  General.   FINDINGS:  Pulmonary hypertension with moderate mitral regurgitation,  left ventricular dilatation, anterior wall akinesis.  Ejection fraction  of 25-30% prebypass.  Postbypass initial moderate mitral regurgitation  and pulmonary hypertension, resolved spontaneously, with only trace  residual mitral regurgitation prior to leaving the operating room.  Severe hypokinesis of the anterior wall.   Saphenous vein fair-quality.  Mammary artery good-quality.  Good-quality  targets.  Anterior wall and apex edematous.   CLINICAL NOTE:  [name] is a 55 year old gentleman who presented with  an out-of-hospital myocardial infarction.  This was an anterior wall  myocardial infarction complicated by congestive heart failure.  He  underwent cardiac catheterization, which showed total occlusion of his  LAD.  There was 70% stenosis in obtuse marginal #1.  There was  some  encroachment of clot in the proximal LAD into the very distal portion of  the left main.  The patient initially was not a candidate for surgery  due to pulmonary edema and congestive heart failure.  The patient  diuresed well, however, on the prior surgery was only on 4 L nasal  cannula with good oxygen saturations and improving chest x-ray.  Although was relatively early in the course for a stunned myocardium.  because of the risk to the left main it was felt that the patient's  overall risk with would be less with proceeding with operation at this  time.  The indications, risks, benefits and alternatives and questions  regarding timing were discussed in detail with the patient and his wife.  They understood and accepted the risks and agreed to proceed.   OPERATIVE NOTE:  [name] was brought to the preop holding area on  August 01, 2007.  There he was initially on 4 L nasal cannula.  The  patient subsequently developed hypoxia requiring 100% oxygen.  He did  not complain of chest pain.  Anesthesia placed lines for monitoring  arterial, central venous and pulmonary arterial pressure.  The patient  was taken to the operating room.  Prior to being anesthetized and  intubated,  his PA pressures were 66/40.  The patient was anesthetized  and intubated.  The chest, abdomen and legs were prepped and draped in  the usual sterile fashion.  Transesophageal echocardiography was  performed.  It revealed severe left ventricular dysfunction with  anterior and apical akinesis.  There was moderate mitral regurgitation.  Ejection fraction was 25-30%.  The patient was hypoxemic and required  100% oxygen to maintain saturations above 90%.  A median sternotomy was  performed and the left internal mammary artery was harvested using  standard technique.  Simultaneously an incision was made in the medial  aspect of the right leg and the greater saphenous vein was harvested  from the right thigh  endoscopically.  The saphenous vein was of fair  quality.  The mammary artery was of good quality.  The patient was  heparinized prior to dividing the distal end of the mammary artery.   Of note, there was a moderately large left-sided pleural effusion.  The  right pleural space was opened and there was a also moderate to large  right pleural effusion.  The pericardium was opened and there was a  moderate pericardial effusion.  The ascending aorta was inspected.  There was no atherosclerotic disease.  The aorta was cannulated via  concentric 2-0 Ethilon pledgeted pursestring sutures.  A dual-stage  venous cannula was placed via pursestring suture in the right atrial  appendage.  Cardiopulmonary bypass was instituted and the patient was  cooled to 32 degrees Celsius.  The coronary arteries were inspected and  anastomotic sites were chosen.  Of note, the circumflex branches were  very difficult to reach due to the patient's hypertrophied heart.  There  was significant anterior wall edema.  A left ventricular vent was placed  via a pursestring suture in the right superior pulmonary vein.  A  retrograde cardioplegia cannula was placed via pursestring suture in the  right atrium and directed to the coronary sinus, and an antegrade  cardioplegia cannula was placed in the ascending aorta.   The patient was cooled to 32 degrees Celsius.  The aorta was  crossclamped.  The left ventricle was emptied via the vents.  Cardiac  arrest then was achieved with a combination of cold antegrade and  retrograde blood cardioplegia and topical iced saline.  An initial 500  mL of cardioplegia was administered through the antegrade cardioplegia  cannula.  As expected, there was minimal septal cooling antegrade.  There was, however, a rapid diastolic arrest.  An additional 1 L of  cardioplegia was administered via the retrograde cannula to cool the  myocardial septal temperature to 10 degrees Celsius.   A  reversed saphenous vein graft was placed sequentially to obtuse  marginals #2 and 2.  Again, this was a difficult exposure due to the  patient's body habitus and cardiac size.  The vessels were grafted  relatively high in the AV groove because they were intramyocardial more  distally.  The vein graft was anastomosed side-to-side to OM-1 and end-  to-side to OM-2.  Both vessels were 1.5 mm in diameter at the site of  the anastomosis and both were probed proximally and distally at their  completion to ensure patency of the anastomoses.  There was good flow  through the vein graft.  Cardioplegia was administered down the vein  graft at its completion.  Additional cardioplegia was also administered  via the aortic root at that time as well.   The left internal mammary artery was brought  through a window in the  pericardium.  The distal end was beveled.  It was a 2-mm, good-quality  conduit.  It was then anastomosed end-to-side to the LAD, which was a 2-  mm target at the site of the anastomosis.  A 1.5-mm probe passed  proximally past the diagonal branch origin as well as distally to the  apex.  There was some plaquing in the LAD but overall it was a good  target vessel.  The anastomosis was performed with a running 8-0 Prolene  suture.  At the completion of the anastomosis, the bulldog clamps  removed.  Septal rewarming was noted.  The bulldog clamp was replaced  and the mammary pedicle was tacked to the epicardial surface of the  heart with 6-0 Prolene sutures after inspecting for hemostasis.   Additional cardioplegia was administered via the retrograde cannula.  The cardioplegia cannula was removed from the ascending aorta.  The vein  graft was cut to length proximally and the proximal vein graft  anastomosis was performed to a 4.0-mm punch aortotomy with a running 6-0  Prolene suture.  At the completion of the proximal anastomosis, the  patient was placed in Trendelenburg position.  A  warm dose of retrograde  cardioplegia was administered to help de-air the aortic root.  The left  ventricular vent was turned off.  The bulldog clamp then was removed  from the left mammary artery.  Lidocaine was administered.  The aortic  root was de-aired and the aortic crossclamp was removed.  The total  crossclamp time was 59 minutes.   The patient was rewarmed.  He was loaded with milrinone and a dopamine  infusion was started 5 mcg/kg per minute.  All proximal and distal  anastomoses were inspected for hemostasis.  Epicardial pacing wires were  placed on the right ventricle and right atrium and DDD pacing was  initiated.  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  The retrograde cardioplegia  cannula was removed.  When the patient had rewarmed to a core  temperature of 37 degrees Celsius and the milrinone infusion of the  loading dose was completed, a milrinone infusion at 0.3 mcg/kg per  minute was initiated.  A slow trial wean was performed.  It appeared  initially that the patient had good ventricular function.  The patient  was placed back on full support and the left ventricular vent was  removed.  An attempt then was made to wean the patient from bypass.  The  patient failed shortly after weaning from bypass and was placed back on  full support.  The dopamine infusion was increased and an intra-aortic  balloon pump was placed in the right femoral artery.  A femoral A-line  had been placed at the beginning of the procedure.  After 20 minutes of  additional bypass time, a second attempt was made to wean from  cardiopulmonary bypass and the patient did wean on this attempt.  Initially he weaned with very elevated pulmonary artery pressures  consistent with his preoperative pressures and moderate MR by  transesophageal echo.  It was felt that the patient was not a candidate  to have a mitral annuloplasty at this time due to  his already tenuous  pulmonary status despite ultrafiltrating almost 2 L of fluid off during  the bypass run.  With subsequent resuscitation the pulmonary artery  pressures normalized and repeat transesophageal echocardiography  revealed some improvement in left  ventricular wall motion and near-  complete resolution of the mitral regurgitation with only trace residual  mitral regurgitation present with pulmonary artery pressures of 29/15.  Cardiac index was greater than 2 L/min. per sq. m throughout post bypass  period.   A test dose of protamine was administered and was well-tolerated.  The  atrial and aortic cannulae were removed.  The remainder of the protamine  was administered without incident.  The chest was irrigated with 1 L of  warm normal saline containing 1 g of vancomycin.  Hemostasis was  achieved.  The pericardium was reapproximated over the ascending aorta  and vein grafts.  It was not reapproximated over the heart.  Bilateral  pleural tubes and two mediastinal chest tubes were placed through  separate subcostal incisions.  The sternum was closed with a combination  of single and double heavy-gauge stainless steel wires.  Pectoralis  fascia, subcutaneous tissue and skin were closed in standard fashion.  All sponge, needle and instrument counts were correct at the end of the  procedure.  The patient was transported from the operating room to the  surgical intensive care unit in critical condition with dopamine  infusion at 10 mcg/kg per minute and milrinone at 0.3 mcg/kg per minute  as well as a Levophed infusion titrated to maintain blood pressure.  The  intra-aortic balloon pump was at 1:1.      Viviann Spare C. Dorris Fetch, M.D.  Electronically Signed     SCH/MEDQ  D:  08/01/2007  T:  08/02/2007  Job:  [id number]   cc:   Vesta Mixer, M.D.

## 2011-03-07 NOTE — Discharge Summary (Signed)
NAMEJAVIONE, [name] NO.:  [id number]   MEDICAL RECORD NO.:  [id number]          PATIENT TYPE:  INP   LOCATION:  2012                         FACILITY:  [facility]   PHYSICIAN:  Sean Decent. Dorris Huff, M.D.DATE OF BIRTH:  [birth year]   DATE OF ADMISSION:  07/28/2007  DATE OF DISCHARGE:  08/07/2007                               DISCHARGE SUMMARY   HISTORY OF PRESENT ILLNESS:  The patient is a 55 year old white male who  was admitted with chest pain.  The patient began having intermittent  chest pain approximately 2 weeks ago.  He had a bad episode of chest  pain 4 days prior to admission while walking the dog.  Two days later,  on July 26, 2007, he saw Dr. Elease Huff in the office.  An  electrocardiogram was normal, but he was scheduled to have a nuclear  stress test in the coming week.  However, on the date prior to  admission, he developed chest pain and it lasted all day.  He was even  unable to sleep due to the severity of the pain.  He did not have any  nausea or vomiting.  He did have bilateral arm paresthesias.  Because of  the persistence of the pain, the patient allowed his wife to drive him  to the emergency room.  In the emergency department, he was found to  have ST segment changes suggestive of recent anteroseptal myocardial  infarction.  In the emergency room, he was given 4 baby aspirin.  The  pain eased upon arrival but cardiac enzymes were noted to be markedly  elevated with a total CK of 1648 with a CK-MB of 216 and a troponin I of  11.38.  His SGOT was also elevated at 157.  He was felt to require  admission for further evaluation and treatment.  He was admitted to the  coronary care unit, treated with intravenous nitroglycerin, heparin,  Integralin and aspirin.  He was also placed on beta blocker and statin  therapy.  Cardiac catheterization was to be performed as well.   PAST MEDICAL HISTORY:  Reveals hypercholesterolemia.  He has been on  Lipitor 40  mg daily for the past year.   PAST SURGICAL HISTORY:  Includes tonsillectomy and appendectomy.   ALLERGIES:  No known drug allergies.   MEDICATIONS PRIOR TO ADMISSION:  1. Lipitor 40 mg daily.  2. Protonix 40 mg p.r.n.   Noted he was also on a CPAP machine.   HOSPITAL COURSE:  The patient was admitted and placed in the CCU as  described.  Cardiac catheterization was scheduled.  He was found to have  a total occlusion of his LAD.  He also had some disease in his left  circumflex system.  He had an ejection fraction of 30% with anteroapical  akinesis.  Due to these findings, he was felt to be a candidate for  surgical revascularization and consultation was obtained with Sean Spare C.  Dorris Huff, M.D., who evaluated the patient and studies.  Dr.  Dorris Huff agreed that he would benefit from the procedure, but he was  felt to require further interval recovery  of stunned myocardium, as well  as diuresis prior to performing surgery.  He was felt to be medically  stable for proceeding on August 01, 2007, at which time he underwent the  following procedure:  coronary artery bypass grafting x3.  The following  grafts were placed:  Left internal mammary artery to the left anterior  descending artery, also, a saphenous vein graft sequentially to obtuse  marginal 1 and obtuse marginal 2.  Patient also had a placement of an  intra-aortic balloon pump.  He was taken to the surgical intensive care  unit in critical condition.  He was additionally noted to be on multiple  pressor agents at the time of transfer, including dopamine, milrinone  and Levophed.   POSTOPERATIVE HOSPITAL COURSE:  Patient has done quite well.  Inotropes  were weaned and the intra-aortic balloon pump was discontinued and he  has shown stable hemodynamics.  All other routine lines, monitors, and  drainage devices have been discontinued in a routine manner.  He has  required fairly aggressive diuresis for volume overload and  will require  further as an outpatient.  He has slowly advanced in regard to cardiac  rehabilitation in a steady manner.  He has remained neurologically  intact.  He has had no significant cardiac dysrhythmias or ectopy.  His  medications have been adjusted and he has been started on ACE inhibitors  with close monitoring of his creatinine.  He does have a postoperative  acute blood loss anemia, but this has stabilized.  He is asymptomatic in  this regard.  His most recent hemoglobin and hematocrit dated August 05, 2007 are 8.6 and 24.6 respectively.  Incisions are all healing well  without signs of infection.  Oxygen has been weaned and he maintains  good saturations on room air.  His overall status was felt to be stable  for discharge on August 07, 2007.   INSTRUCTIONS:  The patient received written instructions in regard to  medications, activity, diet, wound care and followup.  Followup was to  include Dr. Elease Huff 2 weeks post discharge, Dr. Dorris Huff on August 19, 2007.   MEDICATIONS AT DISCHARGE:  1. Aspirin 325 mg daily.  2. Coreg 3.125 mg daily.  3. Altace 2.5 mg daily.  4. Lipitor 80 mg daily.  5. Protonix 40 mg daily.  6. Ferrous gluconate 324 mg twice daily.  7. Lasix 40 mg daily.  8. K-Dur 40 mEq daily.  9. Oxycodone 5 mg 1-2 every 4-6 hours as needed for pain.   FINAL DIAGNOSIS:  Severe coronary artery disease, status post acute  myocardial infarction.   OTHER DIAGNOSES:  1. Hypercholesterolemia.  2. Previous tonsillectomy.  3. Previous appendectomy.  4. History of gastroesophageal reflux.  5. History of CPAP use.  6. Postoperative anemia.      [name], P.A.-C.      [name], M.D.  Electronically Signed   WEG/MEDQ  D:  08/07/2007  T:  08/08/2007  Job:  [id number]   cc:   [name], M.D.  [name], M.D.  [name], M.D.

## 2011-03-07 NOTE — Assessment & Plan Note (Signed)
OFFICE VISIT   TAHEEM, FRICKE  DOB:  [birth year]                                        August 19, 2007  CHART #:  [id number]   HISTORY OF PRESENT ILLNESS:  Mr. Dahmen is a 55 year old gentleman who  was admitted to [facility] with an acute anterior wall  myocardial infarction. He had severe 2 vessel coronary disease. 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 He underwent coronary artery bypass grafting x3 on  August 01, 2007. He had severe left ventricular dysfunction and required  placement of an intra-aortic balloon pump, in order to wean from  cardiopulmonary bypass. Postoperatively, his initial course was slow and  he remained in the Intensive Care Unit for several days. The balloon  pump was weaned. He subsequently did quite well and was discharged to  home on postoperative day 6. Since that time, he has continued to do  well. He is not having any pain. He is not having to take any pain  medication. He has not had any shortness of breath. He denies any  swelling in his legs. He has not had any anginal type chest pain. His  wife does states that he feels cold a lot of the time and that is new  since his surgery.   PHYSICAL EXAMINATION:  GENERAL:  Mr. Mcfarren is a 55 year old male in no  acute distress.  VITAL SIGNS:  Blood pressure 111/72, pulse 89, respiratory rate 18.  Oxygen saturation is 100% on room air.  LUNGS:  Equal breath sounds bilaterally. There is no crackles or  wheezing.  CARDIAC:  Regular rate and rhythm. Normal S1 and S2. I did not hear a  murmur.  CHEST:  Sternum is stable. Sternal incision is healing well, as are his  leg incisions.   DIAGNOSTIC STUDIES:  Chest x-ray shows good aeration of the lungs  bilaterally. There is no significant effusion.   IMPRESSION/PLAN:  Mr. Husain is doing very well at  this point in time.  Surprisingly, while given the severity of his myocardial infarction, his  exercise tolerance is good. He is going to start cardiac rehabilitation  next week. I encouraged him to continue with that program as well as the  importance of life long exercise and attention to diet. He is on a good  medication regimen presently. Over time, his ace inhibitor dosage may be  increased but right now, with his blood pressure where it is, I would  not adjust that. He will continue to be followed from a cardiac  standpoint by Dr. Elease Hashimoto. From my standpoint, I advised him not to lift  any objects greater than 10 pounds until it has been at least 6 weeks  from the operation. He may begin driving but appropriate precautions  should be followed and were discussed in detail with him. He will  continue to be followed up with Dr. Elease Hashimoto. It would be interesting to  see what his left ventricular function does over time. I would be happy  to see him back at any time, if I can be of any further assistance with  his care.   Salvatore Decent Dorris Fetch, M.D.  Electronically Signed   SCH/MEDQ  D:  08/19/2007  T:  08/19/2007  Job:  [id number]   cc:   Vesta Mixer, M.D.  Talmadge Coventry, M.D.

## 2011-03-07 NOTE — Letter (Signed)
August 19, 2007   Vesta Mixer, M.D.  [address], [address]  [place]  [place] [address]   Re:  NELDON, SHEPARD              DOB:  25-Aug-1956   Dear Loistine Chance:   I saw Ahmaud [place] back in the office today.  As you know, Mr. Demasi  is a very nice 55 year old gentleman who presented with an acute MI, in  early October.  He had a rather large anterior wall MI out of hospital.  We did bypass grafting on him on October the 9th and he still had some  myocardial stunting and required placement of a intra-aortic balloon  pump at that time to wean from cardiopulmonary bypass.  He had me very  concerned in the early postoperative period but he subsequently did  extremely well and has continued to do well since his discharge from the  hospital.  He is having minimal discomfort and is not having to take any  pain medication.  His exercise tolerance is good.  He is going to start  cardiac rehab next week.  I have enclosed a copy of my office notes for  your records.  Again, thank you very much for allowing me to see Mr.  [place] and please do not hesitate to contact me if I can be of any  further assistance with his care in the future.   Salvatore Decent Dorris Fetch, M.D.  Electronically Signed   SCH/MEDQ  D:  08/19/2007  T:  08/19/2007  Job:  [id number]   cc:   Talmadge Coventry, M.D.

## 2011-03-07 NOTE — Assessment & Plan Note (Signed)
[facility]                             PULMONARY OFFICE NOTE   NAME:Dillenburg, [name]                           MRN:          [id number]  DATE:04/22/2007                            DOB:          04/09/1956    PROBLEM:  This is a 55 year old man seen on kind referral from Dr.  Elease Hashimoto in sleep medicine consultation concerned about snoring and sleep  apnea.   HISTORY:  His wife reports very loud snoring for years and witnessed  apnea also for years, but getting worse. He falls asleep watching TV.  Bedtime is between 10:30pm and 11:30pm with very short sleep latency. He  will wake 2 or 3 times during the night before finally up at 5:30am.   MEDICATIONS:  Lipitor 40 mg.   ALLERGIES:  No medication allergy.   REVIEW OF SYSTEMS:  As per HPI. Daytime sleepiness occasionally requires  a nap. He has had a recent incidental viral cold syndrome.   PAST HISTORY:  Elevated cholesterol, allergic rhinitis. He has had  surgeries for appendectomy, tonsillectomy and knee surgery. He denies  heart or lung disease otherwise, but he has been evaluated according to  Dr. Harvie Bridge notes for bradycardia. Primary care is with Dr. Gar Gibbon  Mazzocchi.   SOCIAL HISTORY:  Never smoked, married, little caffeine. 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   FAMILY HISTORY:  Father with coronary disease and myocardial infarction.  Mother with renal cancer. Father known to be a loud snorer, but nobody  diagnosed with sleep apnea.   OBJECTIVE:  VITAL SIGNS:  Weight 194 pounds, blood pressure 142/94,  pulse 72, room air saturation 97%.  HEENT:  Palette spacing 4/4, voice quality normal, small mandible, no  strider, no thyromegaly.  CHEST:  Clear with unlabored airflow.  HEART:  Sounds regular without murmur or gallop.  EXTREMITIES:  Without cyanosis, clubbing, edema, or tremor.   IMPRESSION:  1. Probable obstructive sleep apnea.  2. Elevated cholesterol.  3.  Hypertension.  4. Mild seasonal rhinitis.   PLAN:  We discussed sleep apnea and emphasized good sleep hygiene,  weight control, and responsibility to be alert while driving. We are  scheduling a split protocol sleep study and he will return for follow up  after that is completed. I appreciate the chance to meet him.     Clinton D. Maple Hudson, MD, Tonny Bollman, FACP  Electronically Signed    CDY/MedQ  DD: 04/28/2007  DT: 04/29/2007  Job #: [id number]   cc:   Vesta Mixer, M.D.  [facility]  Talmadge Coventry, M.D.

## 2011-03-07 NOTE — Procedures (Signed)
NAME:  [name], [name]            ACCOUNT NO.:  [id number]   MEDICAL RECORD NO.:  [id number]          PATIENT TYPE:  OUT   LOCATION:  SLEEP CENTER                 FACILITY:  [facility]   PHYSICIAN:  Clinton D. Maple Hudson, MD, FCCP, FACPDATE OF BIRTH:  11-10-1955   DATE OF STUDY:                            NOCTURNAL POLYSOMNOGRAM   REFERRING PHYSICIAN:  Clinton D. Young, MD, FCCP, FACP   INDICATION FOR STUDY:  Hypersomnia with sleep apnea.   EPWORTH SLEEPINESS SCORE:  7/24. BMI 30.2, weight 193 pounds.   MEDICATIONS:  Home medications are listed and reviewed.   SLEEP ARCHITECTURE:  Total sleep time 365 minutes with sleep efficiency  86%. Stage 1 ws 8%, stage 2 73%, stage 3 absent REM 19% of total sleep  time. Sleep latency 6 minutes. REM latency 241 minutes. Awake after  sleep onset 55 minutes. Arousal index 10.5. No bedtime medication was  taken.   RESPIRATORY DATA:  Split-study protocol. Apnea/hypopnea index (AHI, RDI)  24.2 obstructive events per hour indicating moderate obstructive sleep  apnea/hypopnea syndrome. There were 37 obstructive apneas and 22  hypopneas before CPAP. Events were most common while supine. REM AHI  0.9. CPAP was titrated to 9 CWP, AHI 0 per hour. The patient used a  small Mirage micro mask and a chin strap for oral venting with heated  humidifier. His second choice was a Conservation officer, historic buildings mask which  could replace the nasal mask and  chin strap.   OXYGEN DATA:  Moderate snoring occasionally loud before CPAP with oxygen  desaturation to a nadir of 86%. Mean oxygen saturation after CPAP  control 96% on room air.   CARDIAC DATA:  Sinus rhythm with occasional PACS.   MOVEMENT-PARASOMNIA:  No significant limb movement disturbance. Bathroom  x1.   IMPRESSIONS-RECOMMENDATIONS:  1. Moderate obstructive sleep apnea/hypopnea syndrome, AHI 24.2 per      hour with events more common while supine. Moderate-to-loud snoring      with oxygen desaturation nadir  86%.  2. Successful CPAP titration to 9 CWP, AHI 0 per hour. The mask used      was a Psychologist, sport and exercise (small) with second choice being a Mirage      Quattro full-face mask (small). A chin strap was used for the first      mask.      Clinton D. Maple Hudson, MD, FCCP, FACP  Diplomate, Biomedical engineer of Sleep Medicine  Electronically Signed     CDY/MEDQ  D:  06/22/2007 15:09:59  T:  06/23/2007 14:30:10  Job:  [id number]

## 2011-03-07 NOTE — Assessment & Plan Note (Signed)
[facility]                             PULMONARY OFFICE NOTE   NAME:[name], [name]                     MRN:          [id number]  DATE:06/26/2007                            DOB:          [birth year]    PROBLEMS:  1. Obstructive sleep apnea.  2. Elevated cholesterol.  3. Hypertension.  4. Seasonal rhinitis.   HISTORY:  He returns with wife after sleep study done on June 14, 2007, which confirmed moderate obstructive apnea with an index of 24.2  per hour, moderate-to-loud snoring, oxygen desaturation to 86%.  CPAP  was successfully titrated to 9 CWP for an index of 0 per hour.  We again  talked about the medical concerns of sleep apnea, the basic physiology,  and available treatments.   OBJECTIVE:  Weight 193 pounds.  BP 122/70, pulse 67.  Room air  saturation 98%.  He is alert.  Nasal airway is not obstructed.  Breathing unlabored.  Pulse regular.  No tremor.   IMPRESSION:  Obstructive sleep apnea, aggravated by mild rhinitis.   PLAN:  1. Weight loss and safety driving.  2. Home trial of CPAP, 9 CWP with discussion.  3. Schedule return in two months, earlier p.r.n.     Clinton D. Maple Hudson, MD, Tonny Bollman, FACP  Electronically Signed    CDY/MedQ  DD: 06/26/2007  DT: 06/26/2007  Job #: [id number]   cc:   Vesta Mixer, M.D.  Talmadge Coventry, M.D.

## 2011-03-07 NOTE — Op Note (Signed)
NAMENOAM, KARAFFA            ACCOUNT NO.:  [id number]   MEDICAL RECORD NO.:  [id number]          PATIENT TYPE:  AMB   LOCATION:  SDS                          FACILITY:  [facility]   PHYSICIAN:  Lubertha Basque. Dalldorf, M.D.DATE OF BIRTH:  1956-04-16   DATE OF PROCEDURE:  09/01/2008  DATE OF DISCHARGE:                               OPERATIVE REPORT   PREOPERATIVE DIAGNOSES:  1. Left shoulder impingement.  2. Left shoulder acromioclavicular degeneration.  3. Left shoulder massive rotator cuff tear.   POSTOPERATIVE DIAGNOSES:  1. Left shoulder impingement.  2. Left shoulder acromioclavicular degeneration.  3. Left shoulder massive rotator cuff tear.   PROCEDURES:  1. Left shoulder arthroscopic acromioplasty.  2. Left shoulder arthroscopic partial claviculectomy.  3. Left shoulder open rotator cuff repair.   ANESTHESIA:  General and block.   ATTENDING SURGEON:  Lubertha Basque. Dalldorf, MD   INDICATIONS FOR PROCEDURE:  The patient is a 55 year old man, who fell  about 2 months back and suffered an injury to his left arm.  He has had  trouble bringing it up since that time.  He has pain and weakness.  By  MRI scan, he has a massive rotator cuff tear, but no muscle atrophy at  this juncture.  At age 76, our hope is that we can fix this very large  tear.  Informed operative consent was obtained after discussion of  possible complications of reaction to anesthesia, infection,  neurovascular injury, and the possibility of failure of repair.   SUMMARY/FINDINGS/PROCEDURE:  Under general anesthesia and a block, left  shoulder arthroscopy was performed.  Glenohumeral joint showed no  degenerative changes and the biceps tendon appeared in its normal  position.  The subscapularis appeared intact.  In the subacromial space,  he was noted to have a massive tear involving the entire infraspinatus  and most of the supraspinatus.  This was all retracted back to the level  of the glenoid.  He did have  fairly good tissue mobility.  I freed  things up arthroscopically, then felt that it was best to address this  in an open fashion.  I made an extension of the lateral portal and  dissected down through a longitudinal split in the deltoid.  We then  repaired the rotator cuff side-to-side with FiberWire and then to the  greater tuberosity with one Bio-Corkscrew anchor and its two associated  FiberWire sutures.  I did perform an arthroscopic acromioplasty and a  partial claviculectomy before opening the shoulder.   DESCRIPTION OF PROCEDURE:  The patient was brought to the operating  suite where general anesthetic was applied without difficulty.  He was  also given a block in the preanesthesia area.  He was positioned in  beach-chair position, and prepped, draped in normal sterile fashion.  After administration of IV Kefzol, an arthroscopy of the left shoulder  was formed through a total of two portals.  Findings were noted as  above.  Procedure here consisted of an arthroscopic acromioplasty done  with the bur in the lateral position followed by transfer of the bur to  the posterior position.  I then  performed a coplane at the [facility] joint,  performing a partial claviculectomy for an undersurface spur.  I  debrided the devitalized portions of his rotator cuff and then thought  this might be repairable in an open fashion.  We subsequently extended  his lateral incision to about 3 cm.  Dissection was carried through a  longitudinal split in the deltoid.  We had very difficult exposure as  the tear was all the way to the edge of the glenoid.  I was able to  place some tag sutures in the posterior aspect of the cuff and in the  anterior aspect of the cuff and brought this together.  I then used 2-0  FiberWire to repair side-to-side, medial towards lateral.  On the most  lateral portion, we placed a Bio-Corkscrew anchor near the greater  tuberosity.  I created a bleeding bed of bone around this and  then  passed anterior and posterior limbs of these two sutures through the  respective portions of the cuff, then tied knots superiorly.  We then  placed a scope back on the shoulder.  He was seen to have a nice solid  cuff repair that moved well on rotation.  The shoulder was irrigated  followed by reapproximation of deltoid and subcutaneous tissues with  Vicryl.  Skin was closed with nylon and the posterior portals loosely  closed with nylon.  I applied Adaptic and dry gauze dressing with tape.  Estimated blood loss and fluids obtained from anesthesia records.   DISPOSITION:  The patient was extubated in the operating room and taken  to recovery room in stable addition.  He is to go home same day, pending  the anesthesia clearance, but will likely be home tonight.  I will  contact him by phone this evening.      Lubertha Basque Jerl Santos, M.D.  Electronically Signed     PGD/MEDQ  D:  09/01/2008  T:  09/02/2008  Job:  [id number]

## 2011-07-25 LAB — CBC
HCT: 43.2
MCV: 86.5
Platelets: 246
RDW: 12.9

## 2011-07-25 LAB — BASIC METABOLIC PANEL
BUN: 16
GFR calc Af Amer: >60
Potassium: 4.2

## 2011-08-02 LAB — B-NATRIURETIC PEPTIDE (CONVERTED LAB): Pro B Natriuretic peptide (BNP): 1288 — ABNORMAL HIGH

## 2011-08-03 LAB — POCT I-STAT 4, (NA,K, GLUC, HGB,HCT)
Glucose, Bld: 119 — ABNORMAL HIGH
Glucose, Bld: 121 — ABNORMAL HIGH
Glucose, Bld: 133 — ABNORMAL HIGH
Glucose, Bld: 163 — ABNORMAL HIGH
HCT: 24 — ABNORMAL LOW
HCT: 26 — ABNORMAL LOW
HCT: 32 — ABNORMAL LOW
Hemoglobin: 11.9 — ABNORMAL LOW
Hemoglobin: 8.5 — ABNORMAL LOW
Operator id: 121271
Operator id: 3291
Operator id: 3291
Operator id: 3291
Potassium: 4.5
Potassium: 4.7
Potassium: 5.2 — ABNORMAL HIGH
Potassium: 5.7 — ABNORMAL HIGH
Potassium: 6 — ABNORMAL HIGH
Sodium: 123 — ABNORMAL LOW
Sodium: 129 — ABNORMAL LOW
Sodium: 129 — ABNORMAL LOW
Sodium: 130 — ABNORMAL LOW

## 2011-08-03 LAB — CBC
HCT: 24.2 — ABNORMAL LOW
HCT: 32.8 — ABNORMAL LOW
HCT: 39.5
Hemoglobin: 10 — ABNORMAL LOW
Hemoglobin: 13.8
Hemoglobin: 8.3 — ABNORMAL LOW
Hemoglobin: 8.6 — ABNORMAL LOW
Hemoglobin: 8.9 — ABNORMAL LOW
Hemoglobin: 9.1 — ABNORMAL LOW
MCHC: 34.3
MCHC: 34.6
MCHC: 34.6
MCHC: 34.7
MCHC: 34.9
MCHC: 35
MCHC: 35.1
MCHC: 35.2
MCV: 86.6
MCV: 87.1
MCV: 87.2
MCV: 87.4
MCV: 87.4
MCV: 87.6
Platelets: 184
Platelets: 197
Platelets: 215
Platelets: 235
Platelets: 242
Platelets: 268
Platelets: 289
RBC: 2.76 — ABNORMAL LOW
RBC: 2.95 — ABNORMAL LOW
RBC: 3.32 — ABNORMAL LOW
RBC: 3.94 — ABNORMAL LOW
RBC: 4.48
RBC: 4.93
RDW: 12.8
RDW: 13
RDW: 13.1
RDW: 13.2
RDW: 13.3
RDW: 13.4
WBC: 10
WBC: 10.1
WBC: 11.1 — ABNORMAL HIGH
WBC: 11.8 — ABNORMAL HIGH
WBC: 9.1
WBC: 9.9

## 2011-08-03 LAB — COMPREHENSIVE METABOLIC PANEL
ALT: 43
ALT: 44
Albumin: 3.2 — ABNORMAL LOW
Albumin: 4
Alkaline Phosphatase: 51
BUN: 13
BUN: 15
BUN: 27 — ABNORMAL HIGH
CO2: 25
CO2: 27
Calcium: 8.5
Calcium: 8.8
Chloride: 105
Chloride: 93 — ABNORMAL LOW
Chloride: 98
Creatinine, Ser: 1.22
GFR calc Af Amer: >60
GFR calc non Af Amer: >60
Potassium: 3.3 — ABNORMAL LOW
Potassium: 3.4 — ABNORMAL LOW
Sodium: 136
Total Bilirubin: 2.2 — ABNORMAL HIGH
Total Bilirubin: 2.5 — ABNORMAL HIGH

## 2011-08-03 LAB — BASIC METABOLIC PANEL
BUN: 21
BUN: 25 — ABNORMAL HIGH
BUN: 26 — ABNORMAL HIGH
BUN: 29 — ABNORMAL HIGH
BUN: 31 — ABNORMAL HIGH
CO2: 24
CO2: 25
CO2: 26
CO2: 26
CO2: 28
CO2: 29
CO2: 29
Calcium: 7.7 — ABNORMAL LOW
Calcium: 7.9 — ABNORMAL LOW
Calcium: 8 — ABNORMAL LOW
Calcium: 8.2 — ABNORMAL LOW
Calcium: 8.3 — ABNORMAL LOW
Calcium: 8.3 — ABNORMAL LOW
Calcium: 8.3 — ABNORMAL LOW
Chloride: 102
Chloride: 102
Chloride: 95 — ABNORMAL LOW
Chloride: 96
Chloride: 97
Chloride: 99
Creatinine, Ser: 0.83
Creatinine, Ser: 1.08
Creatinine, Ser: 1.19
Creatinine, Ser: 1.21
Creatinine, Ser: 1.27
GFR calc Af Amer: >60
GFR calc Af Amer: >60
GFR calc Af Amer: >60
GFR calc Af Amer: >60
GFR calc Af Amer: >60
GFR calc Af Amer: >60
GFR calc non Af Amer: >60
Glucose, Bld: 105 — ABNORMAL HIGH
Glucose, Bld: 110 — ABNORMAL HIGH
Glucose, Bld: 118 — ABNORMAL HIGH
Glucose, Bld: 125 — ABNORMAL HIGH
Glucose, Bld: 97
Potassium: 3.5
Potassium: 3.9
Potassium: 4
Sodium: 133 — ABNORMAL LOW
Sodium: 134 — ABNORMAL LOW
Sodium: 136
Sodium: 136

## 2011-08-03 LAB — DIFFERENTIAL
Basophils Relative: 0
Eosinophils Absolute: 0
Eosinophils Relative: 0
Lymphocytes Relative: 10 — ABNORMAL LOW

## 2011-08-03 LAB — BLOOD GAS, ARTERIAL
Acid-base deficit: 0.4
O2 Content: 3
O2 Saturation: 99.3
Patient temperature: 98.6
TCO2: 23

## 2011-08-03 LAB — URINALYSIS, ROUTINE W REFLEX MICROSCOPIC
Ketones, ur: NEGATIVE
Nitrite: NEGATIVE
Protein, ur: 30 — AB
pH: 6.5

## 2011-08-03 LAB — APTT
aPTT: 34
aPTT: 37
aPTT: >200

## 2011-08-03 LAB — POCT I-STAT 3, ART BLOOD GAS (G3+)
Acid-base deficit: 2
Bicarbonate: 22.8
Bicarbonate: 23.1
Bicarbonate: 23.9
Bicarbonate: 25 — ABNORMAL HIGH
O2 Saturation: 100
O2 Saturation: 100
O2 Saturation: 95
O2 Saturation: 99
O2 Saturation: 99
O2 Saturation: 99
Operator id: 121271
Operator id: 3291
Patient temperature: 38
Patient temperature: 38.1
Patient temperature: 38.8
TCO2: 24
TCO2: 24
TCO2: 24
TCO2: 25
TCO2: 25
TCO2: 26
TCO2: 28
pCO2 arterial: 39.6
pH, Arterial: 7.356
pH, Arterial: 7.365
pH, Arterial: 7.373
pH, Arterial: 7.453 — ABNORMAL HIGH
pH, Arterial: 7.482 — ABNORMAL HIGH
pO2, Arterial: 302 — ABNORMAL HIGH

## 2011-08-03 LAB — PROTIME-INR
INR: 1
INR: 1.1
INR: 1.3
Prothrombin Time: 13.7
Prothrombin Time: 14.4
Prothrombin Time: 16.7 — ABNORMAL HIGH

## 2011-08-03 LAB — TYPE AND SCREEN: ABO/RH(D): O POS

## 2011-08-03 LAB — CREATININE, SERUM
Creatinine, Ser: 1.1
GFR calc Af Amer: >60

## 2011-08-03 LAB — HEPARIN LEVEL (UNFRACTIONATED): Heparin Unfractionated: <0.1 — ABNORMAL LOW

## 2011-08-03 LAB — URINE MICROSCOPIC-ADD ON

## 2011-08-03 LAB — CARDIAC PANEL(CRET KIN+CKTOT+MB+TROPI)
CK, MB: 147 — ABNORMAL HIGH
Relative Index: 9.9 — ABNORMAL HIGH
Total CK: 1689 — ABNORMAL HIGH
Troponin I: 15.6
Troponin I: 23.89

## 2011-08-03 LAB — I-STAT EC8
Acid-base deficit: 1
BUN: 31 — ABNORMAL HIGH
BUN: 34 — ABNORMAL HIGH
Bicarbonate: 22.1
Chloride: 101
Glucose, Bld: 112 — ABNORMAL HIGH
HCT: 24 — ABNORMAL LOW
HCT: 26 — ABNORMAL LOW
Hemoglobin: 8.2 — ABNORMAL LOW
Hemoglobin: 8.8 — ABNORMAL LOW
Operator id: 209401
Operator id: 284731
Sodium: 134 — ABNORMAL LOW
Sodium: 135
pCO2 arterial: 31.4 — ABNORMAL LOW

## 2011-08-03 LAB — HEMOGLOBIN AND HEMATOCRIT, BLOOD: HCT: 25.5 — ABNORMAL LOW

## 2011-08-03 LAB — POCT I-STAT 3, VENOUS BLOOD GAS (G3P V)
Operator id: 3291
TCO2: 24
pH, Ven: 7.37 — ABNORMAL HIGH

## 2011-08-03 LAB — MAGNESIUM: Magnesium: 3.2 — ABNORMAL HIGH

## 2011-08-03 LAB — CK TOTAL AND CKMB (NOT AT ARMC)
CK, MB: 22.6 — ABNORMAL HIGH
Relative Index: 5.2 — ABNORMAL HIGH
Total CK: 1648 — ABNORMAL HIGH

## 2011-08-03 LAB — LIPID PANEL
LDL Cholesterol: 72
Total CHOL/HDL Ratio: 2.8
VLDL: 10

## 2011-08-03 LAB — B-NATRIURETIC PEPTIDE (CONVERTED LAB)
Pro B Natriuretic peptide (BNP): 1034 — ABNORMAL HIGH
Pro B Natriuretic peptide (BNP): 1557 — ABNORMAL HIGH
Pro B Natriuretic peptide (BNP): 2208 — ABNORMAL HIGH

## 2011-08-03 LAB — ABO/RH: ABO/RH(D): O POS

## 2011-08-15 ENCOUNTER — Encounter: Payer: Self-pay | Admitting: Cardiovascular Disease

## 2011-08-21 ENCOUNTER — Encounter: Payer: Self-pay | Admitting: Cardiovascular Disease

## 2011-08-22 ENCOUNTER — Ambulatory Visit (INDEPENDENT_AMBULATORY_CARE_PROVIDER_SITE_OTHER): Payer: BC Managed Care – PPO | Admitting: *Deleted

## 2011-08-22 ENCOUNTER — Encounter: Payer: Self-pay | Admitting: Cardiovascular Disease

## 2011-08-22 ENCOUNTER — Ambulatory Visit (INDEPENDENT_AMBULATORY_CARE_PROVIDER_SITE_OTHER): Payer: BC Managed Care – PPO | Admitting: Cardiovascular Disease

## 2011-08-22 VITALS — BP 128/90 | HR 66 | Ht 67.0 in | Wt 200.0 lb

## 2011-08-22 DIAGNOSIS — I10 Essential (primary) hypertension: Secondary | ICD-10-CM

## 2011-08-22 DIAGNOSIS — E785 Hyperlipidemia, unspecified: Secondary | ICD-10-CM

## 2011-08-22 DIAGNOSIS — I2510 Atherosclerotic heart disease of native coronary artery without angina pectoris: Secondary | ICD-10-CM

## 2011-08-22 LAB — LIPID PANEL
Cholesterol: 134 mg/dL (ref 0–200)
LDL Cholesterol: 75 mg/dL (ref 0–99)
Total CHOL/HDL Ratio: 3

## 2011-08-22 LAB — BASIC METABOLIC PANEL
Chloride: 105 mEq/L (ref 96–112)
Potassium: 3.9 mEq/L (ref 3.5–5.1)
Sodium: 138 mEq/L (ref 135–145)

## 2011-08-22 LAB — HEPATIC FUNCTION PANEL
ALT: 27 U/L (ref 0–53)
AST: 22 U/L (ref 0–37)
Bilirubin, Direct: 0 mg/dL (ref 0.0–0.3)
Total Bilirubin: 1 mg/dL (ref 0.3–1.2)

## 2011-08-22 NOTE — Assessment & Plan Note (Signed)
[name] has a history of coronary artery disease. He's had coronary artery bypass grafting. He initially had congestive heart failure but his ejection fraction has improved remarkably. His last ejection fraction is between 40 and 45%.  I encouraged him to stick to a good diet. His wife is on a diabetic diet and I've encouraged him to eat the same food. I've asked him to exercise on regular basis. I'll see him again in 6 months for an office visit and fasting lab work.

## 2011-08-22 NOTE — Progress Notes (Signed)
[name] Date of Birth  01/06/1956 [facility] [address]    [address] [place], [place]  [address] [phone]  Fax  [phone]  History of Present Illness:  [name]  Is a 55 year old gentleman with a history of coronary artery disease. Status post coronary artery bypass grafting. He has a history of congestive heart failure. His most recent ejection fraction was around 40% - 45%.  He denies episodes of chest pain or shortness of breath. He has had some episodes of dizziness that have occurred in the middle of the night. They resolved spontaneously.  Current Outpatient Prescriptions on File Prior to Visit  Medication Sig Dispense Refill  . aspirin 325 MG tablet Take 325 mg by mouth daily.        Marland Kitchen atorvastatin (LIPITOR) 80 MG tablet Take 80 mg by mouth daily.        . carvedilol (COREG) 6.25 MG tablet Take 6.25 mg by mouth 2 (two) times daily with a meal.        . furosemide (LASIX) 40 MG tablet Take 40 mg by mouth daily.        . potassium chloride SA (K-DUR,KLOR-CON) 20 MEQ tablet Take 20 mEq by mouth daily.        . ramipril (ALTACE) 2.5 MG tablet Take 2.5 mg by mouth daily.          No Known Allergies  Past Medical History  Diagnosis Date  . Congestive heart failure     EF equals 40%  . Coronary artery disease     Status post previous anterior wall myocardial infarction.   . Hypercholesterolemia     He has a hx of   . Bradycardia     He has a hx of  . Dyslipidemia     He has a hx  . History of gastroesophageal reflux (GERD)   . Hypertension   . OSA on CPAP   . Allergic rhinitis   . Myocardial infarction     Past Surgical History  Procedure Date  . Coronary artery bypass graft   . Cardiac catheterization     His EF is 40-45%. This has increased from 30% at the time of  last year(2008-9)  . US echocardiography 10-25-2007    Est EF 35-40%  . Tonsillectomy   . Appendectomy Age 89  . Knee surgery 2006    History  Smoking status  . Never  Smoker   Smokeless tobacco  . Not on file    History  Alcohol Use     Family History  Problem Relation Age of Onset  . Heart attack Father   . Kidney cancer Mother     Reviw of Systems:  Reviewed in the HPI.  All other systems are negative.  Physical Exam: BP 128/90  Pulse 66  Ht 5\' 7"  (1.702 m)  Wt 200 lb (90.719 kg)  BMI 31.32 kg/m2 The patient is alert and oriented x 3.  The mood and affect are normal.   Skin: warm and dry.  Color is normal.    HEENT:   Normal carotids, no JVD  Lungs: clear   Heart: RR, no murmurs    Abdomen: soft, good BS  Extremities:  No C/C/E  Neuro:  Non focal     ECG: Normal sinus rhythm. He has an old anterior wall myocardial infarction. He has ST and T-wave adenopathies in the lateral leads. This EKG is unchanged from his previous tracings.  Assessment / Plan:

## 2011-08-22 NOTE — Assessment & Plan Note (Signed)
He is to continue with the diet and exercise program. He is to continue his current medications. We'll check his fasting lipids at his next visit.

## 2011-08-22 NOTE — Patient Instructions (Signed)
Your physician recommends that you continue on your current medications as directed. Please refer to the Current Medication list given to you today.   Your physician wants you to follow-up in: 6 months  You will receive a reminder letter in the mail two months in advance. If you don't receive a letter, please call our office to schedule the follow-up appointment.   Your physician recommends that you return for a FASTING lipid profile: 6 months   

## 2011-08-24 ENCOUNTER — Encounter: Payer: Self-pay | Admitting: Internal Medicine

## 2011-08-24 ENCOUNTER — Ambulatory Visit (INDEPENDENT_AMBULATORY_CARE_PROVIDER_SITE_OTHER): Payer: BC Managed Care – PPO | Admitting: Internal Medicine

## 2011-08-24 VITALS — BP 120/70 | HR 65 | Ht 67.0 in | Wt 202.4 lb

## 2011-08-24 DIAGNOSIS — G4730 Sleep apnea, unspecified: Secondary | ICD-10-CM

## 2011-08-24 DIAGNOSIS — J309 Allergic rhinitis, unspecified: Secondary | ICD-10-CM

## 2011-08-24 NOTE — Progress Notes (Signed)
08/24/11- 55 year old never smoker followed for OSA, Allergic rhinitis, complicated by CAD/ Hx CABG, HBP LOV-09/29/2008  Wife here He says he is still doing very well, using CPAP all night every night. His wife confirms this. He does not have snoring through the mask or daytime sleepiness. We discussed how to replace her old machine. Pressure remains at 9. Allergy discomfort has not been an active issue.   [place] HPI Constitutional:   No-   weight loss, night sweats, fevers, chills, fatigue, lassitude. HEENT:   No-  headaches, difficulty swallowing, tooth/dental problems, sore throat,       No-  sneezing, itching, ear ache, nasal congestion, post nasal drip,  CV:  No-   chest pain, orthopnea, PND, swelling in lower extremities, anasarca, dizziness, palpitations Resp: No-   shortness of breath with exertion or at rest.              No-   productive cough,  No non-productive cough,  No- coughing up of blood.              No-   change in color of mucus.  No- wheezing.   Skin: No-   rash or lesions. GI:  No-   heartburn, indigestion, abdominal pain, nausea, vomiting, diarrhea,                 change in bowel habits, loss of appetite GU: No-   dysuria, change in color of urine, no urgency or frequency.  No- flank pain. MS:  No-   joint pain or swelling.  No- decreased range of motion.  No- back pain. Neuro-     nothing unusual Psych:  No- change in mood or affect. No depression or anxiety.  No memory loss.  OBJ General- Alert, Oriented, Affect-appropriate, Distress- none acute; stocky, not obese Skin- rash-none, lesions- none, excoriation- none Lymphadenopathy- none Head- atraumatic            Eyes- Gross vision intact, PERRLA, conjunctivae clear secretions            Ears- Hearing, canals-normal            Nose- Clear, no-Septal dev, mucus, polyps, erosion, perforation             Throat- Mallampati IV , mucosa clear , drainage- none, tonsils- atrophic Neck- flexible , trachea midline, no  stridor , thyroid nl, carotid no bruit Chest - symmetrical excursion , unlabored           Heart/CV- RRR , no murmur , no gallop  , no rub, nl s1 s2                           - JVD- none , edema- none, stasis changes- none, varices- none           Lung- clear to P&A, wheeze- none, cough- none , dullness-none, rub- none           Chest wall-  Abd- tender-no, distended-no, bowel sounds-present, HSM- no Br/ Gen/ Rectal- Not done, not indicated Extrem- cyanosis- none, clubbing, none, atrophy- none, strength- nl Neuro- grossly intact to observation

## 2011-08-24 NOTE — Patient Instructions (Signed)
Continue CPAP at 9 cwp  Please call as needed

## 2011-08-26 NOTE — Assessment & Plan Note (Signed)
Good compliance and control no change needed. CPAP education done.

## 2011-08-26 NOTE — Assessment & Plan Note (Signed)
Good control with no active questions at this visit.

## 2011-11-20 ENCOUNTER — Other Ambulatory Visit: Payer: Self-pay | Admitting: *Deleted

## 2011-11-20 MED ORDER — POTASSIUM CHLORIDE CRYS ER 20 MEQ PO TBCR: 20.0000 meq | EXTENDED_RELEASE_TABLET | Freq: Every day | ORAL | Status: DC

## 2011-11-20 MED ORDER — RAMIPRIL 2.5 MG PO TABS: 2.5000 mg | ORAL_TABLET | Freq: Every day | ORAL | Status: DC

## 2011-11-20 MED ORDER — CARVEDILOL 6.25 MG PO TABS: 6.2500 mg | ORAL_TABLET | Freq: Two times a day (BID) | ORAL | Status: DC

## 2011-11-20 MED ORDER — ATORVASTATIN CALCIUM 80 MG PO TABS: 80.0000 mg | ORAL_TABLET | Freq: Every day | ORAL | Status: DC

## 2011-11-20 MED ORDER — FUROSEMIDE 40 MG PO TABS: 40.0000 mg | ORAL_TABLET | Freq: Every day | ORAL | Status: DC

## 2011-11-20 NOTE — Telephone Encounter (Signed)
Fax Received. Refill Completed. Lexington Krotz Chowoe (R.M.A)   

## 2011-11-20 NOTE — Telephone Encounter (Signed)
Fax Received. Refill Completed. [name] (R.M.A)   

## 2011-12-25 ENCOUNTER — Other Ambulatory Visit: Payer: Self-pay | Admitting: Cardiovascular Disease

## 2011-12-25 NOTE — Telephone Encounter (Signed)
New problem:  Clarification of medication- coreg.

## 2011-12-26 ENCOUNTER — Other Ambulatory Visit: Payer: Self-pay | Admitting: *Deleted

## 2011-12-26 ENCOUNTER — Telehealth: Payer: Self-pay | Admitting: Cardiovascular Disease

## 2011-12-26 MED ORDER — CARVEDILOL 6.25 MG PO TABS: 6.2500 mg | ORAL_TABLET | Freq: Two times a day (BID) | ORAL | Status: DC

## 2011-12-26 NOTE — Telephone Encounter (Signed)
Med reordered with correct dispense amount,  pt was called and apologized to for inconvenience, pt accepting.

## 2011-12-26 NOTE — Telephone Encounter (Signed)
New msg [facility] wants to know if can change carvedilol from 30 pills to 60 because she takes 2 a day. She said they have faxed three times and pt has called . Pt is not happy

## 2011-12-26 NOTE — Telephone Encounter (Signed)
meds refilled 

## 2012-05-21 ENCOUNTER — Other Ambulatory Visit: Payer: Self-pay | Admitting: *Deleted

## 2012-05-21 MED ORDER — ATORVASTATIN CALCIUM 80 MG PO TABS: 80.0000 mg | ORAL_TABLET | Freq: Every day | ORAL | Status: DC

## 2012-05-21 MED ORDER — FUROSEMIDE 40 MG PO TABS: 40.0000 mg | ORAL_TABLET | Freq: Every day | ORAL | Status: DC

## 2012-05-21 MED ORDER — RAMIPRIL 2.5 MG PO TABS: 2.5000 mg | ORAL_TABLET | Freq: Every day | ORAL | Status: DC

## 2012-05-21 NOTE — Telephone Encounter (Signed)
Fax Received. Refill Completed. [name] (R.M.A)   

## 2012-07-23 ENCOUNTER — Encounter: Payer: Self-pay | Admitting: Cardiovascular Disease

## 2012-07-23 ENCOUNTER — Ambulatory Visit (INDEPENDENT_AMBULATORY_CARE_PROVIDER_SITE_OTHER): Payer: BC Managed Care – PPO | Admitting: Cardiovascular Disease

## 2012-07-23 VITALS — BP 118/80 | HR 57 | Ht 67.0 in | Wt 198.0 lb

## 2012-07-23 DIAGNOSIS — E785 Hyperlipidemia, unspecified: Secondary | ICD-10-CM

## 2012-07-23 DIAGNOSIS — I10 Essential (primary) hypertension: Secondary | ICD-10-CM

## 2012-07-23 DIAGNOSIS — I2510 Atherosclerotic heart disease of native coronary artery without angina pectoris: Secondary | ICD-10-CM

## 2012-07-23 LAB — BASIC METABOLIC PANEL
CO2: 28 mEq/L (ref 19–32)
Calcium: 9.2 mg/dL (ref 8.4–10.5)
Potassium: 3.8 mEq/L (ref 3.5–5.1)
Sodium: 137 mEq/L (ref 135–145)

## 2012-07-23 LAB — LIPID PANEL
HDL: 40.2 mg/dL (ref >39.00)
Total CHOL/HDL Ratio: 3
Triglycerides: 62 mg/dL (ref 0.0–149.0)
VLDL: 12.4 mg/dL (ref 0.0–40.0)

## 2012-07-23 LAB — HEPATIC FUNCTION PANEL
ALT: 30 U/L (ref 0–53)
AST: 28 U/L (ref 0–37)
Albumin: 4.6 g/dL (ref 3.5–5.2)

## 2012-07-23 MED ORDER — CARVEDILOL 6.25 MG PO TABS: 6.2500 mg | ORAL_TABLET | Freq: Two times a day (BID) | ORAL | Status: DC

## 2012-07-23 MED ORDER — ATORVASTATIN CALCIUM 80 MG PO TABS: 80.0000 mg | ORAL_TABLET | Freq: Every day | ORAL | Status: DC

## 2012-07-23 MED ORDER — FUROSEMIDE 40 MG PO TABS: 40.0000 mg | ORAL_TABLET | Freq: Every day | ORAL | Status: DC

## 2012-07-23 MED ORDER — POTASSIUM CHLORIDE CRYS ER 20 MEQ PO TBCR: 20.0000 meq | EXTENDED_RELEASE_TABLET | Freq: Every day | ORAL | Status: DC

## 2012-07-23 NOTE — Progress Notes (Signed)
[place] Date of Birth  [birth year] [facility] [address]    [address] [place], [place]  [address] [phone]  Fax  [phone]   Problems: 1. CAD - s/p CABG, 2. CHF 3. Hyperlipidemia   History of Present Illness:  [name]  Is a 56 year old gentleman with a history of coronary artery disease. Status post coronary artery bypass grafting. He has a history of congestive heart failure. His most recent ejection fraction was around 40% - 45%.  He denies episodes of chest pain or shortness of breath. He has had some episodes of dizziness that have occurred in the middle of the night. They resolved spontaneously.  Current Outpatient Prescriptions on File Prior to Visit  Medication Sig Dispense Refill  . aspirin 325 MG tablet Take 325 mg by mouth daily.        Marland Kitchen atorvastatin (LIPITOR) 80 MG tablet Take 1 tablet (80 mg total) by mouth daily.  30 tablet  4  . carvedilol (COREG) 6.25 MG tablet Take 1 tablet (6.25 mg total) by mouth 2 (two) times daily with a meal.  60 tablet  5  . furosemide (LASIX) 40 MG tablet Take 1 tablet (40 mg total) by mouth daily.  30 tablet  3  . potassium chloride SA (K-DUR,KLOR-CON) 20 MEQ tablet Take 1 tablet (20 mEq total) by mouth daily.  30 tablet  5  . ramipril (ALTACE) 2.5 MG tablet Take 1 tablet (2.5 mg total) by mouth daily.  30 tablet  4    No Known Allergies  Past Medical History  Diagnosis Date  . Congestive heart failure     EF equals 40%  . Coronary artery disease     Status post previous anterior wall myocardial infarction.   . Hypercholesterolemia     He has a hx of   . Bradycardia     He has a hx of  . Dyslipidemia     He has a hx  . History of gastroesophageal reflux (GERD)   . Hypertension   . OSA on CPAP   . Allergic rhinitis   . Myocardial infarction     Past Surgical History  Procedure Date  . Coronary artery bypass graft   . Cardiac catheterization     His EF is 40-45%. This has increased from 30% at  the time of  last year(2008-9)  . US echocardiography 10-25-2007    Est EF 35-40%  . Tonsillectomy   . Appendectomy Age 20  . Knee surgery 2006    History  Smoking status  . Never Smoker   Smokeless tobacco  . Not on file    History  Alcohol Use     Family History  Problem Relation Age of Onset  . Heart attack Father   . Kidney cancer Mother     Reviw of Systems:  Reviewed in the HPI.  All other systems are negative.  Physical Exam: BP 118/80  Pulse 57  Ht 5\' 7"  (1.702 m)  Wt 198 lb (89.812 kg)  BMI 31.01 kg/m2 The patient is alert and oriented x 3.  The mood and affect are normal.   Skin: warm and dry.  Color is normal.    HEENT:   Normal carotids, no JVD  Lungs: clear   Heart: RR, no murmurs    Abdomen: soft, good BS  Extremities:  No C/C/E  Neuro:  Non focal     ECG: Oct. 1, 2013 - Normal sinus rhythm at 57.  He has  an old anterior wall myocardial infarction. He has ST and T-wave abnormalities in the lateral leads. This EKG is unchanged from his previous tracings.  Assessment / Plan:

## 2012-07-23 NOTE — Patient Instructions (Addendum)
Your physician wants you to follow-up in: 1 year  You will receive a reminder letter in the mail two months in advance. If you don't receive a letter, please call our office to schedule the follow-up appointment.  Your physician recommends that you return for a FASTING lipid profile: today   

## 2012-07-23 NOTE — Assessment & Plan Note (Signed)
[name] is doing well. His congestive heart failure is well-controlled. We'll continue with his same medications. He does have some fatigue but I think this is related to his busy workday. 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  I will see him again in one year for followup visit.

## 2012-07-26 ENCOUNTER — Encounter: Payer: Self-pay | Admitting: Cardiovascular Disease

## 2012-10-24 ENCOUNTER — Other Ambulatory Visit: Payer: Self-pay | Admitting: *Deleted

## 2012-10-24 ENCOUNTER — Other Ambulatory Visit: Payer: Self-pay

## 2012-10-24 MED ORDER — RAMIPRIL 2.5 MG PO TABS: 2.5000 mg | ORAL_TABLET | Freq: Every day | ORAL | Status: DC

## 2012-10-24 NOTE — Telephone Encounter (Signed)
Opened in Error.

## 2012-11-27 ENCOUNTER — Encounter: Payer: Self-pay | Admitting: Internal Medicine

## 2013-03-05 ENCOUNTER — Other Ambulatory Visit: Payer: Self-pay | Admitting: *Deleted

## 2013-03-05 DIAGNOSIS — E785 Hyperlipidemia, unspecified: Secondary | ICD-10-CM

## 2013-03-05 MED ORDER — FUROSEMIDE 40 MG PO TABS: 40.0000 mg | ORAL_TABLET | Freq: Every day | ORAL | Status: DC

## 2013-03-05 MED ORDER — RAMIPRIL 2.5 MG PO TABS: 2.5000 mg | ORAL_TABLET | Freq: Every day | ORAL | Status: DC

## 2013-03-05 MED ORDER — CARVEDILOL 6.25 MG PO TABS: 6.2500 mg | ORAL_TABLET | Freq: Two times a day (BID) | ORAL | Status: DC

## 2013-03-05 NOTE — Telephone Encounter (Signed)
Fax Received. Refill Completed. [name] (R.M.A)   

## 2013-03-05 NOTE — Telephone Encounter (Signed)
Fax Received. Refill Completed. Wister Hoefle Chowoe (R.M.A)   

## 2013-03-24 ENCOUNTER — Other Ambulatory Visit: Payer: Self-pay | Admitting: *Deleted

## 2013-03-24 DIAGNOSIS — E785 Hyperlipidemia, unspecified: Secondary | ICD-10-CM

## 2013-03-24 MED ORDER — POTASSIUM CHLORIDE CRYS ER 20 MEQ PO TBCR: 20.0000 meq | EXTENDED_RELEASE_TABLET | Freq: Every day | ORAL | Status: DC

## 2013-03-24 NOTE — Telephone Encounter (Signed)
Fax Received. Refill Completed. Desere Gwin Chowoe (R.M.A)   

## 2013-04-24 ENCOUNTER — Other Ambulatory Visit: Payer: Self-pay | Admitting: *Deleted

## 2013-04-24 DIAGNOSIS — E785 Hyperlipidemia, unspecified: Secondary | ICD-10-CM

## 2013-04-24 MED ORDER — CARVEDILOL 6.25 MG PO TABS: 6.2500 mg | ORAL_TABLET | Freq: Two times a day (BID) | ORAL | Status: DC

## 2013-04-24 MED ORDER — FUROSEMIDE 40 MG PO TABS: 40.0000 mg | ORAL_TABLET | Freq: Every day | ORAL | Status: DC

## 2013-04-24 MED ORDER — ATORVASTATIN CALCIUM 80 MG PO TABS: 80.0000 mg | ORAL_TABLET | Freq: Every day | ORAL | Status: DC

## 2013-04-24 NOTE — Telephone Encounter (Signed)
NEED APPOINTMENT Fax Received. Refill Completed. Eriel Dunckel Chowoe (R.M.A)   

## 2013-04-24 NOTE — Telephone Encounter (Signed)
Fax Received. Refill Completed. [name] (R.M.A)   

## 2013-07-28 ENCOUNTER — Other Ambulatory Visit: Payer: Self-pay | Admitting: *Deleted

## 2013-07-28 DIAGNOSIS — E785 Hyperlipidemia, unspecified: Secondary | ICD-10-CM

## 2013-07-28 MED ORDER — FUROSEMIDE 40 MG PO TABS: 40.0000 mg | ORAL_TABLET | Freq: Every day | ORAL | Status: DC

## 2013-07-28 MED ORDER — ATORVASTATIN CALCIUM 80 MG PO TABS: 80.0000 mg | ORAL_TABLET | Freq: Every day | ORAL | Status: DC

## 2013-07-28 NOTE — Telephone Encounter (Signed)
Wife called in for refills, she is at [facility] now, patient needed appointment and it has been set up for 10/24.

## 2013-08-06 ENCOUNTER — Encounter: Payer: Self-pay | Admitting: Cardiovascular Disease

## 2013-08-15 ENCOUNTER — Encounter: Payer: Self-pay | Admitting: Cardiovascular Disease

## 2013-08-15 ENCOUNTER — Ambulatory Visit (INDEPENDENT_AMBULATORY_CARE_PROVIDER_SITE_OTHER): Payer: BC Managed Care – PPO | Admitting: Cardiovascular Disease

## 2013-08-15 VITALS — BP 124/78 | HR 56 | Ht 67.0 in | Wt 203.0 lb

## 2013-08-15 DIAGNOSIS — I2510 Atherosclerotic heart disease of native coronary artery without angina pectoris: Secondary | ICD-10-CM

## 2013-08-15 DIAGNOSIS — I10 Essential (primary) hypertension: Secondary | ICD-10-CM

## 2013-08-15 DIAGNOSIS — E785 Hyperlipidemia, unspecified: Secondary | ICD-10-CM

## 2013-08-15 LAB — BASIC METABOLIC PANEL
Calcium: 8.9 mg/dL (ref 8.4–10.5)
Creatinine, Ser: 1 mg/dL (ref 0.4–1.5)
GFR: 83.67 mL/min (ref >60.00)
Glucose, Bld: 92 mg/dL (ref 70–99)
Potassium: 4.1 mEq/L (ref 3.5–5.1)
Sodium: 137 mEq/L (ref 135–145)

## 2013-08-15 LAB — HEPATIC FUNCTION PANEL
ALT: 29 U/L (ref 0–53)
AST: 24 U/L (ref 0–37)
Alkaline Phosphatase: 70 U/L (ref 39–117)
Bilirubin, Direct: 0.1 mg/dL (ref 0.0–0.3)
Total Bilirubin: 1 mg/dL (ref 0.3–1.2)
Total Protein: 7.3 g/dL (ref 6.0–8.3)

## 2013-08-15 LAB — LIPID PANEL
Cholesterol: 123 mg/dL (ref 0–200)
HDL: 40 mg/dL (ref >39.00)
VLDL: 14.6 mg/dL (ref 0.0–40.0)

## 2013-08-15 NOTE — Assessment & Plan Note (Addendum)
Gae Bon is doing well.  No angina.   Will check labs today .  will see him in 1 year with repeat fasting labs

## 2013-08-15 NOTE — Patient Instructions (Signed)
Your physician recommends that you return for a FASTING lipid profile:today   Your physician wants you to follow-up in:1 year  You will receive a reminder letter in the mail two months in advance. If you don't receive a letter, please call our office to schedule the follow-up appointment.   Your physician recommends that you continue on your current medications as directed. Please refer to the Current Medication list given to you today.   

## 2013-08-15 NOTE — Progress Notes (Signed)
[place] Date of Birth  28-Jun-1956 [facility] [address]    [address] [place], [place]  [address] [phone]  Fax  [phone]   Problems: 1. CAD - s/p CABG, 2. CHF 3. Hyperlipidemia   History of Present Illness:  Mr.Kyllonen  Is a 57 year old gentleman with a history of coronary artery disease. Status post coronary artery bypass grafting. He has a history of congestive heart failure. His most recent ejection fraction was around 40% - 45%.  He denies episodes of chest pain or shortness of breath. He has had some episodes of dizziness that have occurred in the middle of the night. They resolved spontaneously.  Oct. 24, 2014:  Gae Bon is doing well.  He has been having episodes of sneezing when he eats as he get full . No CP , no dyspnea.  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 aspirin 325 MG tablet Take 325 mg by mouth daily.        Marland Kitchen atorvastatin (LIPITOR) 80 MG tablet Take 1 tablet (80 mg total) by mouth daily.  30 tablet  3  . carvedilol (COREG) 6.25 MG tablet Take 1 tablet (6.25 mg total) by mouth 2 (two) times daily with a meal.  60 tablet  3  . furosemide (LASIX) 40 MG tablet Take 1 tablet (40 mg total) by mouth daily.  30 tablet  3  . potassium chloride SA (K-DUR,KLOR-CON) 20 MEQ tablet Take 1 tablet (20 mEq total) by mouth daily.  30 tablet  6  . ramipril (ALTACE) 2.5 MG tablet Take 1 tablet (2.5 mg total) by mouth daily.  30 tablet  4   No current facility-administered medications on file prior to visit.    No Known Allergies  Past Medical History  Diagnosis Date  . Congestive heart failure     EF equals 40%  . Coronary artery disease     Status post previous anterior wall myocardial infarction.   . Hypercholesterolemia     He has a hx of   . Bradycardia     He has a hx of  . Dyslipidemia     He has a hx  . History of gastroesophageal reflux (GERD)   . Hypertension   . OSA on CPAP   . Allergic rhinitis   . Myocardial infarction     Past Surgical History  Procedure Laterality Date  . Coronary artery bypass graft    . Cardiac catheterization      His EF is 40-45%. This has increased from 30% at the time of  last year(2008-9)  . US echocardiography  10-25-2007    Est EF 35-40%  . Tonsillectomy    . Appendectomy  Age 52  . Knee surgery  2006    History  Smoking status  . Never Smoker   Smokeless tobacco  . Not on file    History  Alcohol Use     Family History  Problem Relation Age of Onset  . Heart attack Father   . Kidney cancer Mother     Reviw of Systems:  Reviewed in the HPI.  All other systems are negative.  Physical Exam: BP 124/78  Pulse 56  Ht 5\' 7"  (1.702 m)  Wt 203 lb (92.08 kg)  BMI 31.79 kg/m2 The patient is alert and oriented x  3.  The mood and affect are normal.   Skin: warm and dry.  Color is normal.    HEENT:   Normal carotids, no JVD  Lungs: clear   Heart: RR, no murmurs    Abdomen: soft, good BS  Extremities:  No C/C/E  Neuro:  Non focal     ECG: Oct. 24, 2014:  Sinus brady at 56.  Inc. LBBB.  No changes compared to previous tracing.    Assessment / Plan:

## 2013-08-27 ENCOUNTER — Other Ambulatory Visit: Payer: Self-pay | Admitting: Cardiovascular Disease

## 2013-10-21 DIAGNOSIS — Z951 Presence of aortocoronary bypass graft: Secondary | ICD-10-CM | POA: Insufficient documentation

## 2013-10-21 DIAGNOSIS — N529 Male erectile dysfunction, unspecified: Secondary | ICD-10-CM | POA: Insufficient documentation

## 2013-10-24 ENCOUNTER — Other Ambulatory Visit: Payer: Self-pay | Admitting: Cardiovascular Disease

## 2014-02-18 ENCOUNTER — Other Ambulatory Visit: Payer: Self-pay | Admitting: Cardiovascular Disease

## 2014-03-03 ENCOUNTER — Ambulatory Visit (INDEPENDENT_AMBULATORY_CARE_PROVIDER_SITE_OTHER): Payer: BC Managed Care – PPO | Admitting: Internal Medicine

## 2014-03-03 ENCOUNTER — Encounter: Payer: Self-pay | Admitting: Internal Medicine

## 2014-03-03 VITALS — BP 126/70 | HR 54 | Ht 67.0 in | Wt 206.6 lb

## 2014-03-03 DIAGNOSIS — G4733 Obstructive sleep apnea (adult) (pediatric): Secondary | ICD-10-CM

## 2014-03-03 DIAGNOSIS — J309 Allergic rhinitis, unspecified: Secondary | ICD-10-CM

## 2014-03-03 DIAGNOSIS — I2584 Coronary atherosclerosis due to calcified coronary lesion: Secondary | ICD-10-CM

## 2014-03-03 DIAGNOSIS — I2510 Atherosclerotic heart disease of native coronary artery without angina pectoris: Secondary | ICD-10-CM

## 2014-03-03 NOTE — Patient Instructions (Addendum)
Order- Needs new DME to replace SMS   CPAP remains at 9 cwp, replacement mask of choice and supplies, humidifier, as needed   DX OSA  Please call as needed

## 2014-03-03 NOTE — Progress Notes (Signed)
03/03/14- 20yoM never smoker followed for OSA, allergic rhinitis, complicated by HBP, CAD FOLLOWS JQB:HALPF CPAP9 (using [facility] -out of business),needs new co. for supplies,mask fitting good, pr. good,CPAP wears avg. 5-6 hrs.   Wife here LOV- 06/14/07 CPAP machine is about 58 years old.  ROS-see HPI Constitutional:   No-   weight loss, night sweats, fevers, chills, fatigue, lassitude. HEENT:   No-  headaches, difficulty swallowing, tooth/dental problems, sore throat,       No-  sneezing, itching, ear ache, nasal congestion, post nasal drip,  CV:  No-   chest pain, orthopnea, PND, swelling in lower extremities, anasarca,                                  dizziness, palpitations Resp: No-   shortness of breath with exertion or at rest.              No-   productive cough,  No non-productive cough,  No- coughing up of blood.              No-   change in color of mucus.  No- wheezing.   Skin: No-   rash or lesions. GI:  No-   heartburn, indigestion, abdominal pain, nausea, vomiting,  GU:  MS:  No-   joint pain or swelling.  Neuro-     nothing unusual Psych:  No- change in mood or affect. No depression or anxiety.  No memory loss.  OBJ- Physical Exam General- Alert, Oriented, Affect-appropriate, Distress- none acute, Medium build Skin- rash-none, lesions- none, excoriation- none Lymphadenopathy- none Head- atraumatic            Eyes- Gross vision intact, PERRLA, conjunctivae and secretions clear            Ears- Hearing, canals-normal            Nose- Clear, no-Septal dev, mucus, polyps, erosion, perforation             Throat- Mallampati IV , mucosa clear , drainage- none, tonsils- atrophic Neck- flexible , trachea midline, no stridor , thyroid nl, carotid no bruit Chest - symmetrical excursion , unlabored           Heart/CV- RRR , no murmur , no gallop  , no rub, nl s1 s2                           - JVD- none , edema- none, stasis changes- none, varices- none           Lung- clear  to P&A, wheeze- none, cough- none , dullness-none, rub- none           Chest wall-  Abd-  Br/ Gen/ Rectal- Not done, not indicated Extrem- cyanosis- none, clubbing, none, atrophy- none, strength- nl Neuro- grossly intact to observation

## 2014-03-12 ENCOUNTER — Telehealth: Payer: Self-pay | Admitting: Internal Medicine

## 2014-03-12 NOTE — Telephone Encounter (Signed)
Spoke with the pt's spouse  She is asking for the status of CPAP for the pt  I advised will call APS to find out  Called and spoke with Iris  She states that the CPAP is ready, but they have been trying to reach pt without success  They were calling his cell  I gave the home number that the spouse called from and told her they need to try that one  Spoke with the spouse and notified  Nothing further needed

## 2014-03-15 ENCOUNTER — Other Ambulatory Visit: Payer: Self-pay | Admitting: Cardiovascular Disease

## 2014-04-18 NOTE — Assessment & Plan Note (Signed)
Managed by cardiology 

## 2014-04-18 NOTE — Assessment & Plan Note (Signed)
He has demonstrated good compliance and control at CPAP 9 but he needs to replace DME company and get his old machine assessed for possible replacement

## 2014-04-18 NOTE — Assessment & Plan Note (Signed)
controlled 

## 2014-06-17 ENCOUNTER — Other Ambulatory Visit: Payer: Self-pay | Admitting: Cardiovascular Disease

## 2014-07-19 ENCOUNTER — Other Ambulatory Visit: Payer: Self-pay | Admitting: Cardiovascular Disease

## 2014-08-17 ENCOUNTER — Other Ambulatory Visit (INDEPENDENT_AMBULATORY_CARE_PROVIDER_SITE_OTHER): Payer: BC Managed Care – PPO | Admitting: *Deleted

## 2014-08-17 ENCOUNTER — Ambulatory Visit (INDEPENDENT_AMBULATORY_CARE_PROVIDER_SITE_OTHER): Payer: BC Managed Care – PPO | Admitting: Cardiovascular Disease

## 2014-08-17 ENCOUNTER — Encounter: Payer: Self-pay | Admitting: Cardiovascular Disease

## 2014-08-17 VITALS — BP 104/80 | HR 61 | Ht 67.0 in | Wt 205.1 lb

## 2014-08-17 DIAGNOSIS — I25810 Atherosclerosis of coronary artery bypass graft(s) without angina pectoris: Secondary | ICD-10-CM

## 2014-08-17 DIAGNOSIS — E785 Hyperlipidemia, unspecified: Secondary | ICD-10-CM

## 2014-08-17 DIAGNOSIS — I5022 Chronic systolic (congestive) heart failure: Secondary | ICD-10-CM | POA: Insufficient documentation

## 2014-08-17 LAB — LIPID PANEL
Cholesterol: 139 mg/dL (ref 0–200)
HDL: 35.4 mg/dL — ABNORMAL LOW (ref >39.00)
LDL CALC: 81 mg/dL (ref 0–99)
NonHDL: 103.6
TRIGLYCERIDES: 111 mg/dL (ref 0.0–149.0)
Total CHOL/HDL Ratio: 4
VLDL: 22.2 mg/dL (ref 0.0–40.0)

## 2014-08-17 LAB — HEPATIC FUNCTION PANEL
ALT: 26 U/L (ref 0–53)
AST: 24 U/L (ref 0–37)
Albumin: 3.7 g/dL (ref 3.5–5.2)
Alkaline Phosphatase: 75 U/L (ref 39–117)
Bilirubin, Direct: 0 mg/dL (ref 0.0–0.3)
TOTAL PROTEIN: 7.7 g/dL (ref 6.0–8.3)
Total Bilirubin: 1.1 mg/dL (ref 0.2–1.2)

## 2014-08-17 MED ORDER — RAMIPRIL 2.5 MG PO CAPS: 2.5000 mg | ORAL_CAPSULE | Freq: Every day | ORAL | Status: DC

## 2014-08-17 MED ORDER — POTASSIUM CHLORIDE CRYS ER 20 MEQ PO TBCR: 20.0000 meq | EXTENDED_RELEASE_TABLET | Freq: Once | ORAL | Status: DC

## 2014-08-17 MED ORDER — ATORVASTATIN CALCIUM 80 MG PO TABS: 80.0000 mg | ORAL_TABLET | Freq: Every day | ORAL | Status: DC

## 2014-08-17 MED ORDER — CARVEDILOL 6.25 MG PO TABS: 6.2500 mg | ORAL_TABLET | Freq: Two times a day (BID) | ORAL | Status: DC

## 2014-08-17 MED ORDER — FUROSEMIDE 40 MG PO TABS: 40.0000 mg | ORAL_TABLET | Freq: Every day | ORAL | Status: DC

## 2014-08-17 MED ORDER — ASPIRIN EC 81 MG PO TBEC: 81.0000 mg | DELAYED_RELEASE_TABLET | Freq: Every day | ORAL | Status: AC

## 2014-08-17 NOTE — Assessment & Plan Note (Signed)
[name] is doing well. Continue current meds

## 2014-08-17 NOTE — Assessment & Plan Note (Addendum)
His LV function ws 40-45% at his last echo Will repeat an echocardiogram to further assess his LV function.   He does not have any symptoms of worsening CHF.

## 2014-08-17 NOTE — Patient Instructions (Signed)
Your physician has recommended you make the following change in your medication:  DECREASE Aspirin to 81 mg once daily  Your physician has requested that you have an echocardiogram. Echocardiography is a painless test that uses sound waves to create images of your heart. It provides your doctor with information about the size and shape of your heart and how well your heart's chambers and valves are working. This procedure takes approximately one hour. There are no restrictions for this procedure.  Your physician wants you to follow-up in: 1 year with Dr. Acie Fredrickson.  You will receive a reminder letter in the mail two months in advance. If you don't receive a letter, please call our office to schedule the follow-up appointment.

## 2014-08-17 NOTE — Progress Notes (Signed)
[name] Date of Birth  [birth year] [facility] [address]    [place], [place]  [address] [phone]  Fax  [phone]   Problems: 1. CAD - s/p CABG, 2. CHF 3. Hyperlipidemia   History of Present Illness:  [name]  Is a 58 year old gentleman with a history of coronary artery disease. Status post coronary artery bypass grafting. He has a history of congestive heart failure. His most recent ejection fraction was around 40% - 45%.  He denies episodes of chest pain or shortness of breath. He has had some episodes of dizziness that have occurred in the middle of the night. They resolved spontaneously.  Oct. 24, 2014:  [name] is doing well.  He has been having episodes of sneezing when he eats as he get full . No CP , no dyspnea.  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 26, 2015:  [name] is doing well.  He still quite busy working at [employer]. He's not having episodes of chest pain or shortness of breath.  He sneezes when he eats.  He has not had any chest pain or dyspnea.   Current Outpatient Prescriptions on File Prior to Visit  Medication Sig Dispense Refill  . aspirin 325 MG tablet Take 325 mg by mouth daily.        Marland Kitchen atorvastatin (LIPITOR) 80 MG tablet TAKE 1 TABLET BY MOUTH DAILY  30 tablet  0  . carvedilol (COREG) 6.25 MG tablet TAKE 1 TABLET BY MOUTH TWICE DAILY WITH A MEAL  60 tablet  10  . furosemide (LASIX) 40 MG tablet TAKE 1 TABLET BY MOUTH DAILY  30 tablet  0  . potassium chloride SA (K-DUR,KLOR-CON) 20 MEQ tablet TAKE 1 TABLET BY MOUTH DAILY  30 tablet  1  . ramipril (ALTACE) 2.5 MG capsule TAKE 1 CAPSULE BY MOUTH EVERY DAY  30 capsule  0   No current facility-administered medications on file prior to visit.    No Known Allergies  Past Medical History  Diagnosis Date  . Congestive heart failure     EF equals 40%  . Coronary artery disease     Status  post previous anterior wall myocardial infarction.   . Hypercholesterolemia     He has a hx of   . Bradycardia     He has a hx of  . Dyslipidemia     He has a hx  . History of gastroesophageal reflux (GERD)   . Hypertension   . OSA on CPAP   . Allergic rhinitis   . Myocardial infarction     Past Surgical History  Procedure Laterality Date  . Coronary artery bypass graft    . Cardiac catheterization      His EF is 40-45%. This has increased from 30% at the time of  last year(2008-9)  . US echocardiography  10-25-2007    Est EF 35-40%  . Tonsillectomy    . Appendectomy  Age 89  . Knee surgery  2006    History  Smoking status  . Never Smoker   Smokeless tobacco  . Not on file    History  Alcohol Use     Family History  Problem Relation Age of Onset  . Heart attack Father   . Kidney cancer Mother     Reviw of Systems:  Reviewed in the HPI.  All other systems  are negative.  Physical Exam: BP 104/80  Pulse 61  Ht 5\' 7"  (1.702 m)  Wt 205 lb 1.9 oz (93.042 kg)  BMI 32.12 kg/m2 The patient is alert and oriented x 3.  The mood and affect are normal.   Skin: warm and dry.  Color is normal.    HEENT:   Normal carotids, no JVD  Lungs: clear   Heart: RR, no murmurs    Abdomen: soft, good BS  Extremities:  No C/C/E  Neuro:  Non focal     ECG: Oct. 24, 2014:  Sinus rhythm at 61. He no longer has a left bundle branch block. He does have T-wave inversions in leads V2 through V6.   EKG is completely unchanged from his previous EKGs.  Assessment / Plan:

## 2014-08-20 ENCOUNTER — Ambulatory Visit (HOSPITAL_COMMUNITY): Payer: BC Managed Care – PPO | Attending: Cardiovascular Disease | Admitting: Radiology

## 2014-08-20 DIAGNOSIS — E669 Obesity, unspecified: Secondary | ICD-10-CM | POA: Diagnosis not present

## 2014-08-20 DIAGNOSIS — E785 Hyperlipidemia, unspecified: Secondary | ICD-10-CM | POA: Diagnosis not present

## 2014-08-20 DIAGNOSIS — I5021 Acute systolic (congestive) heart failure: Secondary | ICD-10-CM | POA: Diagnosis present

## 2014-08-20 DIAGNOSIS — I5022 Chronic systolic (congestive) heart failure: Secondary | ICD-10-CM

## 2014-08-20 MED ORDER — PERFLUTREN LIPID MICROSPHERE
2.0000 mL | Freq: Once | INTRAVENOUS | Status: AC
  Administered 2014-08-20: 2 mL via INTRAVENOUS

## 2014-08-20 NOTE — Progress Notes (Signed)
Echocardiogram performed with [facility].  

## 2014-08-27 ENCOUNTER — Telehealth: Payer: Self-pay | Admitting: Cardiovascular Disease

## 2014-08-27 NOTE — Telephone Encounter (Signed)
New message    Wife calling stating someone call her husband today returning call back .

## 2014-08-27 NOTE — Telephone Encounter (Signed)
Left message for patient to call the office

## 2014-08-28 NOTE — Telephone Encounter (Signed)
F/u    Pt calling in reference to echo results.

## 2014-08-28 NOTE — Telephone Encounter (Signed)
Left message to call back to office Horton Chin RN

## 2014-08-31 ENCOUNTER — Telehealth: Payer: Self-pay

## 2014-08-31 MED ORDER — SPIRONOLACTONE 25 MG PO TABS: 12.5000 mg | ORAL_TABLET | Freq: Every day | ORAL | Status: DC

## 2014-08-31 NOTE — Telephone Encounter (Signed)
Spoke with patient and reviewed echo results and plan of care.  Patient verbalized understanding and agreement to add Aldactone 12.5 mg once daily and to come back for BMET on 11/25 for check of potassium.

## 2014-08-31 NOTE — Telephone Encounter (Signed)
Patient's wife called a few minutes ago to get results of his echocardiogram. She states [name] just called him with this info, but apparently he didn't understand something, so he asked her to call I explained that i couldn't speak to her due to [place], but she insisted she had signed this form before. She became very upset, saying she and husband have both called several times and don't get a call back. She wants to speak with office manager or Dr. Acie Fredrickson.

## 2014-09-01 ENCOUNTER — Telehealth: Payer: Self-pay | Admitting: Cardiovascular Disease

## 2014-09-01 NOTE — Telephone Encounter (Signed)
Spoke with patient's wife. [name], who is very upset with our office.  She states she called the office yesterday after her husband notified her that I had called him with echo results and plan of care and that he did not understand.  Wife states she called the office for clarification and was told that Dr. Acie Fredrickson and I were not in the office and that they could not give her information due to privacy restrictions.  Wife reports the patient completed a DPR while in the office on 10/26.  I have recollection of this as I was assisting Dr. Acie Fredrickson on this day in the office but evidently the document has not been scanned into the patient's chart.  I apologized for him not understanding the instructions and advised her that I am sorry she was not able to get clarification yesterday.  Wife states she was going to switch cardiologists due to this problem but I was able to talk to her and explain the probable cause of the confusion; she accepted my apology and I reviewed the results of the echo and the plan of care.  I answered all of the wife's questions to her states satisfaction and advised her that if she has further questions or concerns to please call back and ask for me.  She asked me to make our clinical manager, Emeline Darling, RN aware of the situation and I have done that.  Patient's wife verbalized understanding of all instructions and thanked me for my help.

## 2014-09-01 NOTE — Telephone Encounter (Signed)
New message     Pt want to talk to a nurse----she would not leave a message with me

## 2014-09-16 ENCOUNTER — Other Ambulatory Visit (INDEPENDENT_AMBULATORY_CARE_PROVIDER_SITE_OTHER): Payer: BC Managed Care – PPO | Admitting: *Deleted

## 2014-09-16 DIAGNOSIS — I10 Essential (primary) hypertension: Secondary | ICD-10-CM

## 2014-09-17 LAB — BASIC METABOLIC PANEL
BUN: 17 mg/dL (ref 6–23)
CHLORIDE: 101 meq/L (ref 96–112)
CO2: 26 meq/L (ref 19–32)
Calcium: 9.5 mg/dL (ref 8.4–10.5)
Creat: 1.13 mg/dL (ref 0.50–1.35)
Glucose, Bld: 79 mg/dL (ref 70–99)
Potassium: 4.4 mEq/L (ref 3.5–5.3)
SODIUM: 137 meq/L (ref 135–145)

## 2014-09-22 ENCOUNTER — Telehealth: Payer: Self-pay | Admitting: Cardiovascular Disease

## 2014-09-22 NOTE — Telephone Encounter (Signed)
Reviewed results of recent lab test with patient's wife who verbalized understanding and gratitude.  Patient's wife had questions regarding patient's depressed EF; we discussed diet, exercise, and medication therapy and she verbalized gratitude for further help in understanding his condition.  I advised her to call back with future questions or concerns.

## 2014-09-22 NOTE — Telephone Encounter (Signed)
New message ° ° ° ° °Returning Michelle's call °

## 2015-03-05 ENCOUNTER — Ambulatory Visit (INDEPENDENT_AMBULATORY_CARE_PROVIDER_SITE_OTHER): Payer: BC Managed Care – PPO | Admitting: Internal Medicine

## 2015-03-05 ENCOUNTER — Encounter: Payer: Self-pay | Admitting: Internal Medicine

## 2015-03-05 VITALS — BP 112/72 | HR 74 | Ht 67.0 in | Wt 207.0 lb

## 2015-03-05 DIAGNOSIS — J309 Allergic rhinitis, unspecified: Secondary | ICD-10-CM | POA: Diagnosis not present

## 2015-03-05 DIAGNOSIS — G4733 Obstructive sleep apnea (adult) (pediatric): Secondary | ICD-10-CM | POA: Diagnosis not present

## 2015-03-05 DIAGNOSIS — J3089 Other allergic rhinitis: Secondary | ICD-10-CM

## 2015-03-05 DIAGNOSIS — J302 Other seasonal allergic rhinitis: Secondary | ICD-10-CM

## 2015-03-05 NOTE — Patient Instructions (Signed)
You are doing well with CPAP and the pressure is set right for you. Our goal is to wear it at least 4 hours per night, and hopefully we can get it comfortable so you can use it all night, every night.  Please call us or [facility] if you have problems.

## 2015-03-05 NOTE — Progress Notes (Signed)
03/03/14- 36yoM never smoker followed for OSA, allergic rhinitis, complicated by HBP, CAD FOLLOWS KCL:EXNTZ CPAP9 (using [facility] -out of business),needs new co. for supplies,mask fitting good, pr. good,CPAP wears avg. 5-6 hrs.   Wife here LOV- 06/14/07 CPAP machine is about 59 years old.  03/05/15- 7yoM never smoker followed for OSA, allergic rhinitis, complicated by HBP, CAD Follows For: Uses CPAP 9/ [facility] 5-8 hours nightly, mask fits fine. Received new mask. Pt states no new complaints.  Download confirms good control at 9, but only adequate use. He is bothered by pulling of hose. We reviewed download and comfort measures.  ROS-see HPI Constitutional:   No-   weight loss, night sweats, fevers, chills, fatigue, lassitude. HEENT:   No-  headaches, difficulty swallowing, tooth/dental problems, sore throat,       No-  sneezing, itching, ear ache, nasal congestion, post nasal drip,  CV:  No-   chest pain, orthopnea, PND, swelling in lower extremities, anasarca,                                  dizziness, palpitations Resp: No-   shortness of breath with exertion or at rest.              No-   productive cough,  No non-productive cough,  No- coughing up of blood.              No-   change in color of mucus.  No- wheezing.   Skin: No-   rash or lesions. GI:  No-   heartburn, indigestion, abdominal pain, nausea, vomiting,  GU:  MS:  No-   joint pain or swelling.  Neuro-     nothing unusual Psych:  No- change in mood or affect. No depression or anxiety.  No memory loss.  OBJ- Physical Exam General- Alert, Oriented, Affect-appropriate, Distress- none acute, Medium build Skin- rash-none, lesions- none, excoriation- none Lymphadenopathy- none Head- atraumatic            Eyes- Gross vision intact, PERRLA, conjunctivae and secretions clear            Ears- Hearing, canals-normal            Nose- Clear, no-Septal dev, mucus, polyps, erosion, perforation             Throat- Mallampati IV ,  mucosa clear , drainage- none, tonsils- atrophic Neck- flexible , trachea midline, no stridor , thyroid nl, carotid no bruit Chest - symmetrical excursion , unlabored           Heart/CV- RRR , no murmur , no gallop  , no rub, nl s1 s2                           - JVD- none , edema- none, stasis changes- none, varices- none           Lung- clear to P&A, wheeze- none, cough- none , dullness-none, rub- none           Chest wall-  Abd-  Br/ Gen/ Rectal- Not done, not indicated Extrem- cyanosis- none, clubbing, none, atrophy- none, strength- nl Neuro- grossly intact to observation

## 2015-03-06 NOTE — Assessment & Plan Note (Signed)
Control has been good. Nasal congestion doesn't affect CPAP use.Marland Kitchen

## 2015-03-06 NOTE — Assessment & Plan Note (Signed)
Control is good and compliance is good enough, but I think he can do better. We reviewed comfort measures

## 2015-03-26 ENCOUNTER — Encounter: Payer: Self-pay | Admitting: Internal Medicine

## 2015-08-20 ENCOUNTER — Ambulatory Visit (INDEPENDENT_AMBULATORY_CARE_PROVIDER_SITE_OTHER): Payer: BC Managed Care – PPO | Admitting: Cardiovascular Disease

## 2015-08-20 ENCOUNTER — Encounter: Payer: Self-pay | Admitting: Cardiovascular Disease

## 2015-08-20 VITALS — BP 110/76 | HR 66 | Ht 67.0 in | Wt 201.4 lb

## 2015-08-20 DIAGNOSIS — I2510 Atherosclerotic heart disease of native coronary artery without angina pectoris: Secondary | ICD-10-CM

## 2015-08-20 DIAGNOSIS — I10 Essential (primary) hypertension: Secondary | ICD-10-CM | POA: Diagnosis not present

## 2015-08-20 DIAGNOSIS — I5022 Chronic systolic (congestive) heart failure: Secondary | ICD-10-CM

## 2015-08-20 LAB — BASIC METABOLIC PANEL
BUN: 19 mg/dL (ref 7–25)
CHLORIDE: 100 mmol/L (ref 98–110)
CO2: 26 mmol/L (ref 20–31)
CREATININE: 1.09 mg/dL (ref 0.70–1.33)
Calcium: 10 mg/dL (ref 8.6–10.3)
Glucose, Bld: 83 mg/dL (ref 65–99)
POTASSIUM: 4.2 mmol/L (ref 3.5–5.3)
SODIUM: 135 mmol/L (ref 135–146)

## 2015-08-20 MED ORDER — ATORVASTATIN CALCIUM 80 MG PO TABS: 80.0000 mg | ORAL_TABLET | Freq: Every day | ORAL | Status: DC

## 2015-08-20 MED ORDER — POTASSIUM CHLORIDE CRYS ER 20 MEQ PO TBCR: 20.0000 meq | EXTENDED_RELEASE_TABLET | Freq: Every day | ORAL | Status: DC

## 2015-08-20 MED ORDER — FUROSEMIDE 40 MG PO TABS: 40.0000 mg | ORAL_TABLET | Freq: Every day | ORAL | Status: DC

## 2015-08-20 MED ORDER — RAMIPRIL 5 MG PO CAPS: 5.0000 mg | ORAL_CAPSULE | Freq: Every day | ORAL | Status: DC

## 2015-08-20 MED ORDER — CARVEDILOL 6.25 MG PO TABS: 6.2500 mg | ORAL_TABLET | Freq: Two times a day (BID) | ORAL | Status: DC

## 2015-08-20 NOTE — Patient Instructions (Signed)
Medication Instructions:  INCREASE Altace to 5 mg once daily   Labwork: TODAY - basic metabolic panel  Your physician recommends that you return for lab work (basic metabolic panel) in: 2-3 months on the same day as your appointment with Dr. Acie Fredrickson   Testing/Procedures: None Ordered   Follow-Up: Your physician recommends that you schedule a follow-up appointment in: 2-3 months with Dr. Acie Fredrickson   If you need a refill on your cardiac medications before your next appointment, please call your pharmacy.   Thank you for choosing [facility]! Christen Bame, RN [phone]

## 2015-08-20 NOTE — Progress Notes (Signed)
[name] Date of Birth  Mar 20, 1956 [facility] [address]    [place], [place]  [address] [phone]  Fax  [phone]   Problems: 1. CAD - s/p CABG, 2. Chronic systolic CHF 3. Hyperlipidemia   History of Present Illness:  [name]  Is a 59 year old gentleman with a history of coronary artery disease. Status post coronary artery bypass grafting. He has a history of congestive heart failure. His most recent ejection fraction was around 40% - 45%.  He denies episodes of chest pain or shortness of breath. He has had some episodes of dizziness that have occurred in the middle of the night. They resolved spontaneously.  Oct. 24, 2014:  [name] is doing well.  He has been having episodes of sneezing when he eats as he get full . No CP , no dyspnea.  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 26, 2015:  [name] is doing well.  He still quite busy working at [employer]. He's not having episodes of chest pain or shortness of breath.  He sneezes when he eats.  He has not had any chest pain or dyspnea.   Oct. 28, 2016:  [name] is feeling well.  Following his last visit we performed an echo card gram that showed that his left ventricular systolic function had decreased slightly to 30-35%.   We added Aldactone 12. 5 mg a day. Still sneezing while he is eating/when he is full. He's not having quite as much shortness of breath. His wife states that he seems to be breathing more comfortably at night.   Current Outpatient Prescriptions on File Prior to Visit  Medication Sig Dispense Refill  . aspirin EC 81 MG tablet Take 1 tablet (81 mg total) by mouth daily.    Marland Kitchen atorvastatin (LIPITOR) 80 MG tablet Take 1 tablet (80 mg total) by mouth daily at 6 PM. 90 tablet 3  . carvedilol (COREG) 6.25 MG tablet Take 1 tablet (6.25 mg total) by mouth 2 (two) times daily with a meal. 180 tablet 3  . furosemide  (LASIX) 40 MG tablet Take 1 tablet (40 mg total) by mouth daily. 90 tablet 3  . potassium chloride SA (K-DUR,KLOR-CON) 20 MEQ tablet Take 1 tablet (20 mEq total) by mouth once. (Patient taking differently: Take 20 mEq by mouth daily. ) 90 tablet 3  . ramipril (ALTACE) 2.5 MG capsule Take 1 capsule (2.5 mg total) by mouth daily. 90 capsule 3  . spironolactone (ALDACTONE) 25 MG tablet Take 0.5 tablets (12.5 mg total) by mouth daily. 45 tablet 3   No current facility-administered medications on file prior to visit.    No Known Allergies  Past Medical History  Diagnosis Date  . Congestive heart failure (HCC)     EF equals 40%  . Coronary artery disease     Status post previous anterior wall myocardial infarction.   . Hypercholesterolemia     He has a hx of   . Bradycardia     He has a hx of  . Dyslipidemia     He has a hx  . History of gastroesophageal reflux (GERD)   . Hypertension   . OSA on CPAP   . Allergic rhinitis   . Myocardial infarction [facility])     Past Surgical History  Procedure Laterality Date  . Coronary artery bypass graft    . Cardiac  catheterization      His EF is 40-45%. This has increased from 30% at the time of  last year(2008-9)  . US echocardiography  10-25-2007    Est EF 35-40%  . Tonsillectomy    . Appendectomy  Age 26  . Knee surgery  2006    History  Smoking status  . Never Smoker   Smokeless tobacco  . Not on file    History  Alcohol Use: Not on file    Family History  Problem Relation Age of Onset  . Heart attack Father   . Kidney cancer Mother     Reviw of Systems:  Reviewed in the HPI.  All other systems are negative.  Physical Exam: BP 110/76 mmHg  Pulse 66  Ht 5\' 7"  (1.702 m)  Wt 201 lb 6.4 oz (91.354 kg)  BMI 31.54 kg/m2 The patient is alert and oriented x 3.  The mood and affect are normal.   Skin: warm and dry.  Color is normal.    HEENT:   Normal carotids, no JVD  Lungs: clear   Heart: RR, no murmurs    Abdomen:  soft, good BS  Extremities:  No C/C/E  Neuro:  Non focal     ECG: Oct. 28, 2016:  NSR at 66, TWI in the anterior leads.   Assessment / Plan:   1. CAD - s/p CABG, -- no angina   2. Chronic systolic CHF- his ejection fraction has fallen to 30-35%. We added 12.5 mg  Aldactone at his last office visit. We'll increase the Remeron pills 25 mg a day. I'll see him back in the office in several months. If his blood pressure will allow, will increase the Aldactone to 25 mg a day. We will anticipate getting an echocardiogram several months after his last medication titration. If his ejection fraction remains less than 35% then we will need to refer him to EP for consideration for an ICD.  3. Hyperlipidemia    Darcell Yacoub, Wonda Cheng, MD  08/20/2015 3:32 PM    [facility] [place],  [place], [place]  [address] Pager [phone] Phone: [phone]; Fax: [phone]   [facility]  [address] [place], [place]  [address] [phone]   Fax [phone]

## 2015-08-23 ENCOUNTER — Telehealth: Payer: Self-pay | Admitting: Cardiovascular Disease

## 2015-08-23 ENCOUNTER — Other Ambulatory Visit: Payer: Self-pay | Admitting: *Deleted

## 2015-08-23 MED ORDER — SPIRONOLACTONE 25 MG PO TABS: 12.5000 mg | ORAL_TABLET | Freq: Every day | ORAL | Status: DC

## 2015-08-23 NOTE — Telephone Encounter (Signed)
New message       STAT if patient is at the pharmacy , call can be transferred to refill team.   1. Which medications need to be refilled? Spironolactone 25mg  2. Which pharmacy/location is medication to be sent to? [facility] 3. Do they need a 30 day or 90 day supply?  90day---------pt is waiting

## 2015-10-05 ENCOUNTER — Telehealth: Payer: Self-pay | Admitting: Cardiovascular Disease

## 2015-10-05 NOTE — Telephone Encounter (Signed)
New message      Pt had annual physical.  PCP want him to have a colonscopy.  Dr Acie Fredrickson did not want pt to have knee surgery because he did not want him to be put to sleep.  Before wife makes appt, is this ok with Dr Acie Fredrickson?

## 2015-10-05 NOTE — Telephone Encounter (Signed)
I have never said that [facility] could not be put to sleep.  There could have been other reasons why he could not have knee surgery. Propofol for colonoscopy is different than typical general anesthesia and he may have ( and should have ) a colonoscopy

## 2015-10-06 NOTE — Telephone Encounter (Signed)
Left message for patient or wife to call back.

## 2015-10-06 NOTE — Telephone Encounter (Signed)
Follow up   Pt calling back for RN

## 2015-10-06 NOTE — Telephone Encounter (Signed)
Spoke with patient's wife [name] and advised her that Dr. Acie Fredrickson advises patient is cleared for colonoscopy.  I advised her that he has not spoken with anyone to say that patient cannot be put to sleep.  Wife states she was told that Dr. Erin Sons PA called our office and spoke with Dr. Acie Fredrickson who advised patient would not be cleared for knee surgery.  I advised her there is no documentation of that in patient's chart and Dr. Acie Fredrickson denies.  She thanked me for the information and states she will call Dr. Erin Sons office.  Patient has an appointment with Dr. Acie Fredrickson for 1/10.

## 2015-10-15 ENCOUNTER — Encounter: Payer: Self-pay | Admitting: Gastroenterology

## 2015-11-02 ENCOUNTER — Encounter: Payer: Self-pay | Admitting: Cardiovascular Disease

## 2015-11-02 ENCOUNTER — Ambulatory Visit (INDEPENDENT_AMBULATORY_CARE_PROVIDER_SITE_OTHER): Payer: BC Managed Care – PPO | Admitting: Cardiovascular Disease

## 2015-11-02 ENCOUNTER — Other Ambulatory Visit (INDEPENDENT_AMBULATORY_CARE_PROVIDER_SITE_OTHER): Payer: BC Managed Care – PPO | Admitting: *Deleted

## 2015-11-02 VITALS — BP 110/80 | HR 90 | Ht 67.0 in | Wt 200.0 lb

## 2015-11-02 DIAGNOSIS — I10 Essential (primary) hypertension: Secondary | ICD-10-CM | POA: Diagnosis not present

## 2015-11-02 DIAGNOSIS — I2510 Atherosclerotic heart disease of native coronary artery without angina pectoris: Secondary | ICD-10-CM | POA: Diagnosis not present

## 2015-11-02 DIAGNOSIS — E785 Hyperlipidemia, unspecified: Secondary | ICD-10-CM

## 2015-11-02 DIAGNOSIS — I5022 Chronic systolic (congestive) heart failure: Secondary | ICD-10-CM

## 2015-11-02 LAB — BASIC METABOLIC PANEL
BUN: 14 mg/dL (ref 7–25)
CO2: 27 mmol/L (ref 20–31)
CREATININE: 1.01 mg/dL (ref 0.70–1.33)
Calcium: 9.2 mg/dL (ref 8.6–10.3)
Chloride: 103 mmol/L (ref 98–110)
Glucose, Bld: 88 mg/dL (ref 65–99)
POTASSIUM: 4 mmol/L (ref 3.5–5.3)
Sodium: 138 mmol/L (ref 135–146)

## 2015-11-02 MED ORDER — CARVEDILOL 12.5 MG PO TABS: 12.5000 mg | ORAL_TABLET | Freq: Two times a day (BID) | ORAL | Status: DC

## 2015-11-02 NOTE — Addendum Note (Signed)
Addended by: Eulis Foster on: 11/02/2015 03:25 PM   Modules accepted: Orders

## 2015-11-02 NOTE — Patient Instructions (Addendum)
Medication Instructions:  INCREASE Carvedilol (Coreg) to 12.5 mg twice daily   Labwork: TODAY - basic metabolic panel   Testing/Procedures: Your physician has requested that you have an echocardiogram 1 week before follow-up appointment. Echocardiography is a painless test that uses sound waves to create images of your heart. It provides your doctor with information about the size and shape of your heart and how well your heart's chambers and valves are working. This procedure takes approximately one hour. There are no restrictions for this procedure.   Follow-Up: Your physician recommends that you schedule a follow-up appointment in: 2 months with Dr. Acie Fredrickson.   If you need a refill on your cardiac medications before your next appointment, please call your pharmacy.   Thank you for choosing [facility]! Christen Bame, RN [phone]

## 2015-11-02 NOTE — Progress Notes (Signed)
Nero [place] Date of Birth  1956/01/20 [facility] [address]    [place], [place]  [address] [phone]  Fax  [phone]   Problems: 1. CAD - s/p CABG, 2. Chronic systolic CHF 3. Hyperlipidemia   History of Present Illness:  [name]  Is a 60 year old gentleman with a history of coronary artery disease. Status post coronary artery bypass grafting. He has a history of congestive heart failure. His most recent ejection fraction was around 40% - 45%.  He denies episodes of chest pain or shortness of breath. He has had some episodes of dizziness that have occurred in the middle of the night. They resolved spontaneously.  Oct. 24, 2014:  [name] is doing well.  He has been having episodes of sneezing when he eats as he get full . No CP , no dyspnea.  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 26, 2015:  [name] is doing well.  He still quite busy working at [employer]. He's not having episodes of chest pain or shortness of breath.  He sneezes when he eats.  He has not had any chest pain or dyspnea.   Oct. 28, 2016:  [name] is feeling well.  Following his last visit we performed an echo card gram that showed that his left ventricular systolic function had decreased slightly to 30-35%.   We added Aldactone 12. 5 mg a day. Still sneezing while he is eating/when he is full. He's not having quite as much shortness of breath. His wife states that he seems to be breathing more comfortably at night.   Jan. 10, 2017: Doing about the same. We increased Altace during the last visit.  Has not noticed any significant difference.    Current Outpatient Prescriptions on File Prior to Visit  Medication Sig Dispense Refill  . aspirin EC 81 MG tablet Take 1 tablet (81 mg total) by mouth daily.    Marland Kitchen atorvastatin (LIPITOR) 80 MG tablet Take 1 tablet (80 mg total) by mouth daily at 6 PM. 90 tablet 3  .  carvedilol (COREG) 6.25 MG tablet Take 1 tablet (6.25 mg total) by mouth 2 (two) times daily with a meal. 180 tablet 3  . furosemide (LASIX) 40 MG tablet Take 1 tablet (40 mg total) by mouth daily. 90 tablet 3  . potassium chloride SA (K-DUR,KLOR-CON) 20 MEQ tablet Take 1 tablet (20 mEq total) by mouth daily. 90 tablet 3  . ramipril (ALTACE) 5 MG capsule Take 1 capsule (5 mg total) by mouth daily. 90 capsule 3  . spironolactone (ALDACTONE) 25 MG tablet Take 0.5 tablets (12.5 mg total) by mouth daily. 45 tablet 0   No current facility-administered medications on file prior to visit.    No Known Allergies  Past Medical History  Diagnosis Date  . Congestive heart failure (HCC)     EF equals 40%  . Coronary artery disease     Status post previous anterior wall myocardial infarction.   . Hypercholesterolemia     He has a hx of   . Bradycardia     He has a hx of  . Dyslipidemia     He has a hx  . History of gastroesophageal reflux (GERD)   . Hypertension   . OSA on CPAP   . Allergic rhinitis   . Myocardial infarction [facility])     Past Surgical History  Procedure Laterality Date  . Coronary artery bypass graft    . Cardiac catheterization      His EF is 40-45%. This has increased from 30% at the time of  last year(2008-9)  . US echocardiography  10-25-2007    Est EF 35-40%  . Tonsillectomy    . Appendectomy  Age 22  . Knee surgery  2006    History  Smoking status  . Never Smoker   Smokeless tobacco  . Not on file    History  Alcohol Use: Not on file    Family History  Problem Relation Age of Onset  . Heart attack Father   . Kidney cancer Mother     Reviw of Systems:  Reviewed in the HPI.  All other systems are negative.  Physical Exam: BP 110/80 mmHg  Pulse 90  Ht 5\' 7"  (1.702 m)  Wt 200 lb (90.719 kg)  BMI 31.32 kg/m2  SpO2 98% The patient is alert and oriented x 3.  The mood and affect are normal.   Skin: warm and dry.  Color is normal.    HEENT:   Normal  carotids, no JVD  Lungs: clear   Heart: RR, no murmurs    Abdomen: soft, good BS  Extremities:  No C/C/E  Neuro:  Non focal     ECG: Oct. 28, 2016:  NSR at 66, TWI in the anterior leads.   Assessment / Plan:   1. CAD - s/p CABG, -- no angina  2. Chronic systolic CHF- his ejection fraction has fallen to slightly to 30-35%. We have  added  Aldactone 25 mg a day, Coreg 6. 25 BID, Ramipril 5 mg a day . Will increase Coreg to 12. 5 BID.  He really wants to have his ortho surgery .  Will go ahead and get an echo to see if his LV function has improved enough to give the OK for his surgery  Will see him in 2 months   If his LV function has fallen further, will get a myvoiew.   He has not had any angina to suggest that he has coronary ischemia.  3. Hyperlipidemia  4. Orthopedic injury:  He needs to have orthopedic surgery .   We have held off since his LV function has declined.   Will recheck an echo to see if he has improved enough to have surgery .    Nahser, Wonda Cheng, MD  11/02/2015 4:45 PM    [address],  [place], [place]  [address] Pager [phone] Phone: [phone]; Fax: [phone]   [facility]  [address] [place], [place]  [address] [phone]   Fax [phone]

## 2015-11-03 ENCOUNTER — Telehealth: Payer: Self-pay | Admitting: *Deleted

## 2015-11-03 NOTE — Telephone Encounter (Signed)
DPR on file for wife. Wife has been notified of lab results by phone. Wife advised to have pt continue on Altace, Aldactone and Coreg, she gave verbal understanding to plan of care.

## 2015-11-19 ENCOUNTER — Other Ambulatory Visit: Payer: Self-pay | Admitting: Cardiovascular Disease

## 2015-12-02 ENCOUNTER — Ambulatory Visit (AMBULATORY_SURGERY_CENTER): Payer: Self-pay

## 2015-12-02 VITALS — Ht 67.0 in | Wt 205.4 lb

## 2015-12-02 DIAGNOSIS — Z1211 Encounter for screening for malignant neoplasm of colon: Secondary | ICD-10-CM

## 2015-12-02 MED ORDER — SUPREP BOWEL PREP KIT 17.5-3.13-1.6 GM/177ML PO SOLN: 1.0000 | Freq: Once | ORAL | Status: DC

## 2015-12-02 NOTE — Progress Notes (Signed)
No allergies to eggs or soy No past problems with anesthesia No home oxygen No diet/weight loss meds  Has email and internet; does not use email

## 2015-12-16 ENCOUNTER — Encounter: Payer: BC Managed Care – PPO | Admitting: Gastroenterology

## 2015-12-23 ENCOUNTER — Ambulatory Visit (HOSPITAL_COMMUNITY): Payer: BC Managed Care – PPO | Attending: Cardiovascular Disease

## 2015-12-23 ENCOUNTER — Other Ambulatory Visit: Payer: Self-pay

## 2015-12-23 DIAGNOSIS — Z951 Presence of aortocoronary bypass graft: Secondary | ICD-10-CM | POA: Insufficient documentation

## 2015-12-23 DIAGNOSIS — E785 Hyperlipidemia, unspecified: Secondary | ICD-10-CM | POA: Insufficient documentation

## 2015-12-23 DIAGNOSIS — I351 Nonrheumatic aortic (valve) insufficiency: Secondary | ICD-10-CM | POA: Insufficient documentation

## 2015-12-23 DIAGNOSIS — R29898 Other symptoms and signs involving the musculoskeletal system: Secondary | ICD-10-CM | POA: Diagnosis not present

## 2015-12-23 DIAGNOSIS — I110 Hypertensive heart disease with heart failure: Secondary | ICD-10-CM | POA: Diagnosis not present

## 2015-12-23 DIAGNOSIS — I5022 Chronic systolic (congestive) heart failure: Secondary | ICD-10-CM | POA: Diagnosis not present

## 2015-12-23 DIAGNOSIS — Z8249 Family history of ischemic heart disease and other diseases of the circulatory system: Secondary | ICD-10-CM | POA: Insufficient documentation

## 2015-12-23 DIAGNOSIS — G4733 Obstructive sleep apnea (adult) (pediatric): Secondary | ICD-10-CM | POA: Insufficient documentation

## 2015-12-23 DIAGNOSIS — I5021 Acute systolic (congestive) heart failure: Secondary | ICD-10-CM | POA: Diagnosis present

## 2015-12-27 ENCOUNTER — Encounter: Payer: Self-pay | Admitting: Cardiovascular Disease

## 2015-12-27 ENCOUNTER — Ambulatory Visit (INDEPENDENT_AMBULATORY_CARE_PROVIDER_SITE_OTHER): Payer: BC Managed Care – PPO | Admitting: Cardiovascular Disease

## 2015-12-27 VITALS — BP 120/80 | HR 64 | Ht 67.0 in | Wt 205.1 lb

## 2015-12-27 DIAGNOSIS — I5022 Chronic systolic (congestive) heart failure: Secondary | ICD-10-CM | POA: Diagnosis not present

## 2015-12-27 DIAGNOSIS — E785 Hyperlipidemia, unspecified: Secondary | ICD-10-CM

## 2015-12-27 DIAGNOSIS — I10 Essential (primary) hypertension: Secondary | ICD-10-CM

## 2015-12-27 DIAGNOSIS — I2510 Atherosclerotic heart disease of native coronary artery without angina pectoris: Secondary | ICD-10-CM | POA: Diagnosis not present

## 2015-12-27 NOTE — Patient Instructions (Signed)
Medication Instructions:  Your physician recommends that you continue on your current medications as directed. Please refer to the Current Medication list given to you today.   Labwork: None Ordered   Testing/Procedures: None Ordered   Follow-Up: Your physician wants you to follow-up in: 6 months with Dr. Nahser.  You will receive a reminder letter in the mail two months in advance. If you don't receive a letter, please call our office to schedule the follow-up appointment.   If you need a refill on your cardiac medications before your next appointment, please call your pharmacy.   Thank you for choosing [facility]! Colyn Miron, RN [phone]    

## 2015-12-27 NOTE — Progress Notes (Signed)
[name] Date of Birth  09-11-1956 [facility] [address]    [place], [place]  [address] [phone]  Fax  [phone]   Problems: 1. CAD - s/p CABG, 2. Chronic systolic CHF 3. Hyperlipidemia   History of Present Illness:  Mr.Lamy  Is a 60 year old gentleman with a history of coronary artery disease. Status post coronary artery bypass grafting. He has a history of congestive heart failure. His most recent ejection fraction was around 40% - 45%.  He denies episodes of chest pain or shortness of breath. He has had some episodes of dizziness that have occurred in the middle of the night. They resolved spontaneously.  Oct. 24, 2014:  [name] is doing well.  He has been having episodes of sneezing when he eats as he get full . No CP , no dyspnea.  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 26, 2015:  [name] is doing well.  He still quite busy working at [employer]. He's not having episodes of chest pain or shortness of breath.  He sneezes when he eats.  He has not had any chest pain or dyspnea.   Oct. 28, 2016:  [name] is feeling well.  Following his last visit we performed an echo card gram that showed that his left ventricular systolic function had decreased slightly to 30-35%.   We added Aldactone 12. 5 mg a day. Still sneezing while he is eating/when he is full. He's not having quite as much shortness of breath. His wife states that he seems to be breathing more comfortably at night.   Jan. 10, 2017: Doing about the same. We increased Altace during the last visit.  Has not noticed any significant difference.   December 27, 2015: [name] is doing well. His echo shows improvement of his LV function .  He needs to have left knee surgery  Also needs to have a colonoscopy   He is at low risk for both procedures.    Current Outpatient Prescriptions on File Prior to Visit  Medication  Sig Dispense Refill  . aspirin EC 81 MG tablet Take 1 tablet (81 mg total) by mouth daily.    Marland Kitchen atorvastatin (LIPITOR) 80 MG tablet Take 1 tablet (80 mg total) by mouth daily at 6 PM. 90 tablet 3  . carvedilol (COREG) 12.5 MG tablet Take 1 tablet (12.5 mg total) by mouth 2 (two) times daily with a meal. 180 tablet 3  . furosemide (LASIX) 40 MG tablet Take 1 tablet (40 mg total) by mouth daily. 90 tablet 3  . potassium chloride SA (K-DUR,KLOR-CON) 20 MEQ tablet Take 1 tablet (20 mEq total) by mouth daily. 90 tablet 3  . ramipril (ALTACE) 5 MG capsule Take 1 capsule (5 mg total) by mouth daily. 90 capsule 3  . spironolactone (ALDACTONE) 25 MG tablet TAKE 1/2 TABLET BY MOUTH DAILY. 45 tablet 0   No current facility-administered medications on file prior to visit.    No Known Allergies  Past Medical History  Diagnosis Date  . Congestive heart failure (HCC)     EF equals 40%  . Coronary artery disease     Status post previous anterior wall myocardial infarction.   . Hypercholesterolemia     He has a hx of   . Bradycardia     He has a hx of  . Dyslipidemia  He has a hx  . History of gastroesophageal reflux (GERD)   . Hypertension   . OSA on CPAP   . Allergic rhinitis   . Myocardial infarction [facility]) 2008    Past Surgical History  Procedure Laterality Date  . Coronary artery bypass graft    . Cardiac catheterization      His EF is 40-45%. This has increased from 30% at the time of  last year(2008-9)  . US echocardiography  10-25-2007    Est EF 35-40%  . Tonsillectomy    . Appendectomy  Age 57  . Knee surgery  2006  . Shoulder surgery      left    History  Smoking status  . Never Smoker   Smokeless tobacco  . Never Used    History  Alcohol Use No    Family History  Problem Relation Age of Onset  . Heart attack Father   . Kidney cancer Mother   . Colon cancer Neg Hx     Reviw of Systems:  Reviewed in the HPI.  All other systems are negative.  Physical  Exam: BP 120/80 mmHg  Pulse 64  Ht 5\' 7"  (1.702 m)  Wt 205 lb 1.9 oz (93.042 kg)  BMI 32.12 kg/m2 The patient is alert and oriented x 3.  The mood and affect are normal.   Skin: warm and dry.  Color is normal.    HEENT:   Normal carotids, no JVD  Lungs: clear   Heart: RR, no murmurs    Abdomen: soft, good BS  Extremities:  No C/C/E  Neuro:  Non focal     ECG: Oct. 28, 2016:  NSR at 66, TWI in the anterior leads.   Assessment / Plan:   1. CAD - s/p CABG, -- no angina  2. Chronic systolic CHF- his ejection fraction has fallen to slightly to 30-35%. We have  added  Aldactone 25 mg a day, Coreg 6. 25 BID, Ramipril 5 mg a day . Continue  Coreg to 12. 5 BID.  He really wants to have his ortho surgery .  His LV function has improved and he is at low risk for his ortho procedure   3. Hyperlipidemia  4. Orthopedic injury:  He needs to have orthopedic surgery .   His LV function has improved since the last echo.   Continue current meds.  He is at low risk for his left knee surgery   He will also need to have a colonoscopy soon.   He will be at low risk for that procedure as well   Nahser, Wonda Cheng, MD  12/27/2015 4:25 PM    [address] [place],  [place], [place]  [address] Pager [phone] Phone: [phone]; Fax: [phone]   [facility]  [address] [place], [place]  [address] [phone]   Fax [phone]

## 2016-01-07 ENCOUNTER — Telehealth: Payer: Self-pay | Admitting: Gastroenterology

## 2016-01-07 NOTE — Telephone Encounter (Signed)
Patient notified ok to keep colonoscopy as scheduled that his EF was up to 40%

## 2016-01-18 ENCOUNTER — Ambulatory Visit (AMBULATORY_SURGERY_CENTER): Payer: BC Managed Care – PPO | Admitting: Gastroenterology

## 2016-01-18 ENCOUNTER — Encounter: Payer: Self-pay | Admitting: Gastroenterology

## 2016-01-18 VITALS — BP 112/70 | HR 56 | Temp 98.0°F | Resp 16

## 2016-01-18 DIAGNOSIS — K635 Polyp of colon: Secondary | ICD-10-CM

## 2016-01-18 DIAGNOSIS — Z1211 Encounter for screening for malignant neoplasm of colon: Secondary | ICD-10-CM | POA: Diagnosis present

## 2016-01-18 DIAGNOSIS — D123 Benign neoplasm of transverse colon: Secondary | ICD-10-CM

## 2016-01-18 MED ORDER — SODIUM CHLORIDE 0.9 % IV SOLN: 500.0000 mL | INTRAVENOUS | Status: DC

## 2016-01-18 NOTE — Op Note (Signed)
[place] Patient Name: [name] Procedure Date: 01/18/2016 3:26 PM MRN: [id number] Endoscopist: Ladene Artist , MD Age: 60 Referring MD:  Date of Birth: 1956-02-28 Gender: Male Procedure:                Colonoscopy Indications:              Screening for colorectal malignant neoplasm Medicines:                Monitored Anesthesia Care Procedure:                Pre-Anesthesia Assessment:                           - Prior to the procedure, a History and Physical                            was performed, and patient medications and                            allergies were reviewed. The patient's tolerance of                            previous anesthesia was also reviewed. The risks                            and benefits of the procedure and the sedation                            options and risks were discussed with the patient.                            All questions were answered, and informed consent                            was obtained. Prior Anticoagulants: The patient has                            taken no previous anticoagulant or antiplatelet                            agents. ASA Grade Assessment: II - A patient with                            mild systemic disease. After reviewing the risks                            and benefits, the patient was deemed in                            satisfactory condition to undergo the procedure.                           After obtaining informed consent, the colonoscope  was passed under direct vision. Throughout the                            procedure, the patient's blood pressure, pulse, and                            oxygen saturations were monitored continuously. The                            Model PCF-H190L [id number]) scope was introduced                            through the anus and advanced to the the cecum,                            identified by appendiceal orifice and  ileocecal                            valve. The colonoscopy was performed without                            difficulty. The patient tolerated the procedure                            well. The quality of the bowel preparation was                            good. The ileocecal valve, appendiceal orifice, and                            rectum were photographed. Scope In: 3:36:13 PM Scope Out: 3:49:54 PM Scope Withdrawal Time: 0 hours 12 minutes 2 seconds  Total Procedure Duration: 0 hours 13 minutes 41 seconds  Findings:      The digital rectal exam was normal.      A 5 mm polyp was found in the transverse colon. The polyp was sessile.       The polyp was removed with a cold biopsy forceps. Resection and       retrieval were complete.      The entire examined colon otherwise appeared normal on direct views.      Internal hemorrhoids were found during retroflexion. The hemorrhoids       were small and Grade I (internal hemorrhoids that do not prolapse). Complications:            No immediate complications. Estimated Blood Loss:     Estimated blood loss was minimal. Impression:               - One 5 mm polyp in the transverse colon, removed                            with a cold biopsy forceps. Resected and retrieved.                           - Grade I internal hemorrhoids. Recommendation:           -  Patient has a contact number available for                            emergencies. The signs and symptoms of potential                            delayed complications were discussed with the                            patient. Return to normal activities tomorrow.                            Written discharge instructions were provided to the                            patient.                           - Resume previous diet.                           - Continue present medications.                           - Await pathology results.                           - Repeat colonoscopy is  recommended. The                            colonoscopy date will be determined after pathology                            results from today's exam become available for                            review. Colonoscopy in 5 years if polyps is                            precancerous otherwise 10 years. Procedure Code(s):        --- Professional ---                           [phone], Colonoscopy, flexible; with biopsy, single                            or multiple CPT copyright 2016 American Medical Association. All rights reserved. Ladene Artist, MD 01/18/2016 3:55:02 PM This report has been signed electronically. Number of Addenda: 0 Referring MD:      Bernerd Limbo

## 2016-01-18 NOTE — Patient Instructions (Signed)
YOU HAD AN ENDOSCOPIC PROCEDURE TODAY AT THE [facility]:   Refer to the procedure report that was given to you for any specific questions about what was found during the examination.  If the procedure report does not answer your questions, please call your gastroenterologist to clarify.  If you requested that your care partner not be given the details of your procedure findings, then the procedure report has been included in a sealed envelope for you to review at your convenience later.  YOU SHOULD EXPECT: Some feelings of bloating in the abdomen. Passage of more gas than usual.  Walking can help get rid of the air that was put into your GI tract during the procedure and reduce the bloating. If you had a lower endoscopy (such as a colonoscopy or flexible sigmoidoscopy) you may notice spotting of blood in your stool or on the toilet paper. If you underwent a bowel prep for your procedure, you may not have a normal bowel movement for a few days.  Please Note:  You might notice some irritation and congestion in your nose or some drainage.  This is from the oxygen used during your procedure.  There is no need for concern and it should clear up in a day or so.  SYMPTOMS TO REPORT IMMEDIATELY:   Following lower endoscopy (colonoscopy or flexible sigmoidoscopy):  Excessive amounts of blood in the stool  Significant tenderness or worsening of abdominal pains  Swelling of the abdomen that is new, acute  Fever of 100F or higher  For urgent or emergent issues, a gastroenterologist can be reached at any hour by calling [phone].   DIET: Your first meal following the procedure should be a small meal and then it is ok to progress to your normal diet. Heavy or fried foods are harder to digest and may make you feel nauseous or bloated.  Likewise, meals heavy in dairy and vegetables can increase bloating.  Drink plenty of fluids but you should avoid alcoholic beverages for 24  hours.  ACTIVITY:  You should plan to take it easy for the rest of today and you should NOT DRIVE or use heavy machinery until tomorrow (because of the sedation medicines used during the test).    FOLLOW UP: Our staff will call the number listed on your records the next business day following your procedure to check on you and address any questions or concerns that you may have regarding the information given to you following your procedure. If we do not reach you, we will leave a message.  However, if you are feeling well and you are not experiencing any problems, there is no need to return our call.  We will assume that you have returned to your regular daily activities without incident.  If any biopsies were taken you will be contacted by phone or by letter within the next 1-3 weeks.  Please call us at [phone] if you have not heard about the biopsies in 3 weeks.    SIGNATURES/CONFIDENTIALITY: You and/or your care partner have signed paperwork which will be entered into your electronic medical record.  These signatures attest to the fact that that the information above on your After Visit Summary has been reviewed and is understood.  Full responsibility of the confidentiality of this discharge information lies with you and/or your care-partner.  Please review polyp and hemorrhoid handouts provided. Next colonoscopy determined by pathology results; 5 or 10 years. 

## 2016-01-18 NOTE — Progress Notes (Signed)
To PACU  Awake and Alert. Report to RN 

## 2016-01-18 NOTE — Progress Notes (Signed)
Called to room to assist during endoscopic procedure.  Patient ID and intended procedure confirmed with present staff. Received instructions for my participation in the procedure from the performing physician.  

## 2016-01-19 ENCOUNTER — Telehealth: Payer: Self-pay

## 2016-01-19 NOTE — Telephone Encounter (Signed)
  Follow up Call-  Call back number 01/18/2016  Post procedure Call Back phone  # [phone] cell  Permission to leave phone message Yes    Patient was called for follow up after his procedure on 01/18/2016. No answer at the number given for follow up phone call. A message was left on the answering machine.

## 2016-01-28 ENCOUNTER — Encounter: Payer: Self-pay | Admitting: Gastroenterology

## 2016-02-15 ENCOUNTER — Other Ambulatory Visit: Payer: Self-pay | Admitting: Cardiovascular Disease

## 2016-03-06 ENCOUNTER — Ambulatory Visit (INDEPENDENT_AMBULATORY_CARE_PROVIDER_SITE_OTHER): Payer: BC Managed Care – PPO | Admitting: Internal Medicine

## 2016-03-06 ENCOUNTER — Encounter: Payer: Self-pay | Admitting: Internal Medicine

## 2016-03-06 VITALS — BP 124/68 | HR 71 | Ht 67.0 in | Wt 207.0 lb

## 2016-03-06 DIAGNOSIS — G4733 Obstructive sleep apnea (adult) (pediatric): Secondary | ICD-10-CM

## 2016-03-06 NOTE — Progress Notes (Signed)
03/03/14- 76yoM never smoker followed for OSA, allergic rhinitis, complicated by HBP, CAD FOLLOWS [id number] CPAP9 (using [facility] -out of business),needs new co. for supplies,mask fitting good, pr. good,CPAP wears avg. 5-6 hrs.   Wife here LOV- 06/14/07 CPAP machine is about 60 years old.  03/05/15- 16yoM never smoker followed for OSA, allergic rhinitis, complicated by HBP, CAD Follows For: Uses CPAP 9/ Advanced 5-8 hours nightly, mask fits fine. Received new mask. Pt states no new complaints.  Download confirms good control at 9, but only adequate use. He is bothered by pulling of hose. We reviewed download and comfort measures.  03/06/2016-60 year old male never smoker followed for OSA, allergic rhinitis, complicated by HBP, CAD CPAP 9/APS FOLLOWS FOR: DME APS. Pt states he wears CPAP nightly for at least 7 hours; pressure works well and no supplies needed at this time.   ROS-see HPI Constitutional:   No-   weight loss, night sweats, fevers, chills, fatigue, lassitude. HEENT:   No-  headaches, difficulty swallowing, tooth/dental problems, sore throat,       No-  sneezing, itching, ear ache, nasal congestion, post nasal drip,  CV:  No-   chest pain, orthopnea, PND, swelling in lower extremities, anasarca,                                                        dizziness, palpitations Resp: No-   shortness of breath with exertion or at rest.              No-   productive cough,  No non-productive cough,  No- coughing up of blood.              No-   change in color of mucus.  No- wheezing.   Skin: No-   rash or lesions. GI:  No-   heartburn, indigestion, abdominal pain, nausea, vomiting,  GU:  MS:  No-   joint pain or swelling.  Neuro-     nothing unusual Psych:  No- change in mood or affect. No depression or anxiety.  No memory loss.  OBJ- Physical Exam General- Alert, Oriented, Affect-appropriate, Distress- none acute, Medium build Skin- rash-none, lesions- none, excoriation-  none Lymphadenopathy- none Head- atraumatic            Eyes- Gross vision intact, PERRLA, conjunctivae and secretions clear            Ears- Hearing, canals-normal            Nose- Clear, no-Septal dev, mucus, polyps, erosion, perforation             Throat- Mallampati IV , mucosa clear , drainage- none, tonsils- atrophic Neck- flexible , trachea midline, no stridor , thyroid nl, carotid no bruit Chest - symmetrical excursion , unlabored           Heart/CV- RRR , no murmur , no gallop  , no rub, nl s1 s2                           - JVD- none , edema- none, stasis changes- none, varices- none           Lung- clear to P&A, wheeze- none, cough- none , dullness-none, rub- none           Chest wall-  Abd-  Br/ Gen/ Rectal- Not done, not indicated Extrem- cyanosis- none, clubbing, none, atrophy- none, strength- nl Neuro- grossly intact to observation

## 2016-03-06 NOTE — Patient Instructions (Signed)
You are doing very well. We can continue CPAP 9/ APS  Please call if we can help

## 2016-03-13 ENCOUNTER — Emergency Department (HOSPITAL_BASED_OUTPATIENT_CLINIC_OR_DEPARTMENT_OTHER): Payer: Worker's Compensation

## 2016-03-13 ENCOUNTER — Encounter (HOSPITAL_BASED_OUTPATIENT_CLINIC_OR_DEPARTMENT_OTHER): Payer: Self-pay | Admitting: Emergency Medicine

## 2016-03-13 ENCOUNTER — Emergency Department (HOSPITAL_BASED_OUTPATIENT_CLINIC_OR_DEPARTMENT_OTHER)
Admission: EM | Admit: 2016-03-13 | Discharge: 2016-03-13 | Disposition: A | Discharge status: Home / Self Care | Payer: Worker's Compensation | Attending: Emergency Medicine | Admitting: Emergency Medicine

## 2016-03-13 DIAGNOSIS — Z7982 Long term (current) use of aspirin: Secondary | ICD-10-CM | POA: Diagnosis not present

## 2016-03-13 DIAGNOSIS — S42102A Fracture of unspecified part of scapula, left shoulder, initial encounter for closed fracture: Secondary | ICD-10-CM

## 2016-03-13 DIAGNOSIS — Y9389 Activity, other specified: Secondary | ICD-10-CM | POA: Insufficient documentation

## 2016-03-13 DIAGNOSIS — Y999 Unspecified external cause status: Secondary | ICD-10-CM | POA: Insufficient documentation

## 2016-03-13 DIAGNOSIS — S50812A Abrasion of left forearm, initial encounter: Secondary | ICD-10-CM | POA: Insufficient documentation

## 2016-03-13 DIAGNOSIS — E785 Hyperlipidemia, unspecified: Secondary | ICD-10-CM | POA: Insufficient documentation

## 2016-03-13 DIAGNOSIS — I509 Heart failure, unspecified: Secondary | ICD-10-CM | POA: Insufficient documentation

## 2016-03-13 DIAGNOSIS — W010XXA Fall on same level from slipping, tripping and stumbling without subsequent striking against object, initial encounter: Secondary | ICD-10-CM | POA: Insufficient documentation

## 2016-03-13 DIAGNOSIS — I2510 Atherosclerotic heart disease of native coronary artery without angina pectoris: Secondary | ICD-10-CM | POA: Diagnosis not present

## 2016-03-13 DIAGNOSIS — Y9289 Other specified places as the place of occurrence of the external cause: Secondary | ICD-10-CM | POA: Insufficient documentation

## 2016-03-13 DIAGNOSIS — S42112A Displaced fracture of body of scapula, left shoulder, initial encounter for closed fracture: Secondary | ICD-10-CM | POA: Diagnosis not present

## 2016-03-13 DIAGNOSIS — I110 Hypertensive heart disease with heart failure: Secondary | ICD-10-CM | POA: Insufficient documentation

## 2016-03-13 DIAGNOSIS — S4992XA Unspecified injury of left shoulder and upper arm, initial encounter: Secondary | ICD-10-CM | POA: Diagnosis present

## 2016-03-13 DIAGNOSIS — Z79899 Other long term (current) drug therapy: Secondary | ICD-10-CM | POA: Diagnosis not present

## 2016-03-13 DIAGNOSIS — I252 Old myocardial infarction: Secondary | ICD-10-CM | POA: Diagnosis not present

## 2016-03-13 MED ORDER — OXYCODONE-ACETAMINOPHEN 5-325 MG PO TABS: 1.0000 | ORAL_TABLET | ORAL | Status: DC | PRN

## 2016-03-13 NOTE — Discharge Instructions (Signed)
Take the prescribed medication as directed if needed for pain.  Use caution, can make you sleepy/drowsy Follow-up with Dr. Rhona Raider today as scheduled. Return to the ED for new or worsening symptoms.  Scapular Fracture A scapular fracture is a break in the large, triangular bone behind your shoulder (shoulder blade or scapula). This bone makes up the socket joint of your shoulder. The scapula is well protected by muscles, so scapular fractures are unusual injuries. They often involve a lot of force. People who have a scapular fracture often have other injuries as well. These may be injuries to the lung, spine, head, shoulder, or ribs. CAUSES  Typical causes of a scapular fracture include:  A fall from a significant height.  A car or motorcycle accident.  A heavy, direct blow to the scapula. SIGNS AND SYMPTOMS The main symptom of a scapular fracture is severe pain when you try to move your arm. Other signs and symptoms include:  Swelling behind the shoulder.  Bruising.  Holding the arm still and close to the body. DIAGNOSIS Your health care provider may suspect a scapular fracture based on your symptoms and the details of a recent injury. A physical exam will be done. Tests may also be done to confirm the diagnosis. These may include a regular X-ray or a CT scan. Your health care provider will also use these tests to check for injuries to other parts of your body. TREATMENT Treatment options for a scapular fracture include:  Immobilization. Your health care provider may put your arm in a sling and wrap a support bandage around your chest. The health care provider will explain how to move your shoulder for the first week after your injury to prevent stiffness. The sling can be removed as your movement increases and your pain decreases.  Surgery. Surgery is rarely needed. You may need surgery if the bone pieces are out of place (displaced fracture). You may also need surgery if the  fracture causes the bone to be deformed. In this case, the broken scapula will be put back into position and held in place with a surgical plate and screws.  Physical therapy. A physical therapist will teach you exercises to stretch and strengthen your shoulder. The goal is to keep your shoulder from getting stiff or frozen. You may need to do these exercises for 6-12 months. HOME CARE INSTRUCTIONS  Apply ice to the back of your shoulder:  Put ice in a plastic bag.  Place a towel between your skin and the bag.  Leave the ice on for 20 minutes, 2-3 times per day.  If you have a splint and a wrap, wear them all of the time. Remove them only to take a shower or to do your physical therapy exercises.  Take medicines only as directed by your health care provider.  Ask your health care provider:  When you can stop wearing the splint and wrap.  When you can do all of your usual activities again. SEEK MEDICAL CARE IF:  You have pain that is not helped by pain medicine.  You are unable to do your physical therapy because of pain or stiffness. SEEK IMMEDIATE MEDICAL CARE IF:  You are short of breath.  You cough up blood.  You cannot move your arm or your fingers.   This information is not intended to replace advice given to you by your health care provider. Make sure you discuss any questions you have with your health care provider.   Document Released:  10/09/2005 Document Revised: 10/30/2014 Document Reviewed: 03/11/2014 Elsevier Interactive Patient Education [employer].

## 2016-03-13 NOTE — ED Notes (Signed)
Pt given CD of xray/CT results for follow up with ortho today

## 2016-03-13 NOTE — ED Notes (Signed)
PA at bedside.

## 2016-03-13 NOTE — ED Provider Notes (Signed)
CSN: [id number]     Arrival date & time 03/13/16  1111 History   First MD Initiated Contact with Patient 03/13/16 1127     Chief Complaint  Patient presents with  . Shoulder Injury     (Consider location/radiation/quality/duration/timing/severity/associated sxs/prior Treatment) Patient is a 60 y.o. male presenting with shoulder injury. The history is provided by the patient and medical records.  Shoulder Injury Associated symptoms include arthralgias.    60 year old male with history of congestive heart failure, coronary artery disease, hyperlipidemia, history of MI, presenting to the ED for left shoulder injury. Patient was working on a roof when he slipped and fell onto the rubber roof. He did not fall off of the roof.  No head injury or loss of consciousness. He reports persistent pain in his left scapula, no gross deformity noted. He has some abrasions to his forearm as well.  Patient is left hand dominant.  No prior left arm/shoulder injuries or surgeries.  He just returned to work today after left knee surgery.  Denies any chest pain or SOB.  No neck or back pain.  VSS.  Past Medical History  Diagnosis Date  . Congestive heart failure (HCC)     EF equals 40%  . Coronary artery disease     Status post previous anterior wall myocardial infarction.   . Hypercholesterolemia     He has a hx of   . Bradycardia     He has a hx of  . Dyslipidemia     He has a hx  . History of gastroesophageal reflux (GERD)   . Hypertension   . OSA on CPAP   . Allergic rhinitis   . Myocardial infarction ([place]) 2008  . GERD (gastroesophageal reflux disease)   . Sleep apnea     wears c-pap  . Allergy    Past Surgical History  Procedure Laterality Date  . Coronary artery bypass graft    . Cardiac catheterization      His EF is 40-45%. This has increased from 30% at the time of  last year(2008-9)  . US echocardiography  10-25-2007    Est EF 35-40%  . Tonsillectomy    . Appendectomy  Age 89  .  Knee surgery  2006  . Shoulder surgery      left   Family History  Problem Relation Age of Onset  . Heart attack Father   . Kidney cancer Mother   . Colon cancer Neg Hx   . Esophageal cancer Neg Hx   . Pancreatic cancer Neg Hx   . Prostate cancer Neg Hx   . Rectal cancer Neg Hx   . Stomach cancer Neg Hx    Social History  Substance Use Topics  . Smoking status: Never Smoker   . Smokeless tobacco: Never Used  . Alcohol Use: No     Comment: rare    Review of Systems  Musculoskeletal: Positive for arthralgias.  All other systems reviewed and are negative.     Allergies  Review of patient's allergies indicates no known allergies.  Home Medications   Prior to Admission medications   Medication Sig Start Date End Date Taking? Authorizing Provider  aspirin EC 81 MG tablet Take 1 tablet (81 mg total) by mouth daily. 08/17/14   Thayer Headings, MD  atorvastatin (LIPITOR) 80 MG tablet Take 1 tablet (80 mg total) by mouth daily at 6 PM. 08/20/15   Thayer Headings, MD  carvedilol (COREG) 12.5 MG tablet Take 1  tablet (12.5 mg total) by mouth 2 (two) times daily with a meal. 11/02/15   Thayer Headings, MD  furosemide (LASIX) 40 MG tablet Take 1 tablet (40 mg total) by mouth daily. 08/20/15   Thayer Headings, MD  potassium chloride SA (K-DUR,KLOR-CON) 20 MEQ tablet Take 1 tablet (20 mEq total) by mouth daily. 08/20/15   Thayer Headings, MD  ramipril (ALTACE) 5 MG capsule Take 1 capsule (5 mg total) by mouth daily. 08/20/15   Thayer Headings, MD  spironolactone (ALDACTONE) 25 MG tablet TAKE 1/2 TABLET BY MOUTH DAILY. 02/15/16   Thayer Headings, MD   BP 131/83 mmHg  Pulse 66  Temp(Src) 97.7 F (36.5 C) (Oral)  Resp 18  SpO2 96%   Physical Exam  Constitutional: He is oriented to person, place, and time. He appears well-developed and well-nourished. No distress.  HENT:  Head: Normocephalic and atraumatic.  Mouth/Throat: Oropharynx is clear and moist.  Eyes: Conjunctivae and EOM  are normal. Pupils are equal, round, and reactive to light.  Neck: Normal range of motion. Neck supple.  Cardiovascular: Normal rate, regular rhythm and normal heart sounds.   Pulmonary/Chest: Effort normal and breath sounds normal. No respiratory distress. He has no wheezes.  No tenderness or deformities of chest wall, lungs clear  Abdominal: Soft. Bowel sounds are normal. There is no tenderness. There is no guarding.  Musculoskeletal: Normal range of motion.  Abrasions noted to left dorsal forearm, no tenderness, no bleeding, no deformities Left shoulder with TTP along mid scapula; no gross deformity noted; pain with ROM of left shoulder; strong radial pulse, normal cap refill, normal grip strength  Neurological: He is alert and oriented to person, place, and time.  Skin: Skin is warm and dry. He is not diaphoretic.  Psychiatric: He has a normal mood and affect.  Nursing note and vitals reviewed.   ED Course  ORTHOPEDIC INJURY TREATMENT Date/Time: 03/13/2016 2:31 PM Performed by: Larene Pickett Authorized by: Larene Pickett Consent: Verbal consent obtained. Risks and benefits: risks, benefits and alternatives were discussed Consent given by: patient Patient understanding: patient states understanding of the procedure being performed Required items: required blood products, implants, devices, and special equipment available Patient identity confirmed: verbally with patient Injury location: shoulder Location details: left shoulder Injury type: fracture Fracture type: scapular Pre-procedure neurovascular assessment: neurovascularly intact Immobilization: sling Post-procedure neurovascular assessment: post-procedure neurovascularly intact Patient tolerance: Patient tolerated the procedure well with no immediate complications   (including critical care time) Labs Review Labs Reviewed - No data to display  Imaging Review Ct Shoulder Left Wo Contrast  03/13/2016  CLINICAL DATA:   Slipped and fell on the roof today. Left shoulder pain. EXAM: CT OF THE LEFT SHOULDER WITHOUT CONTRAST TECHNIQUE: Multidetector CT imaging was performed according to the standard protocol. Multiplanar CT image reconstructions were also generated. COMPARISON:  None. FINDINGS: Bones/Joint/Cartilage Severely comminuted fracture of the body of the scapula. No involvement of the glenoid or articular surface. No fracture or dislocation. No proximal humeral fracture. No glenohumeral joint dislocation. Mild degenerative changes of the acromioclavicular joint. Type I acromion. Muscles Rotator cuff muscles are normal without atrophy. No fluid collection or hematoma. Soft tissue No fluid collection or hematoma. No soft tissue mass. Visualized left lung is clear. IMPRESSION: 1. Severely comminuted fracture of the body of the scapula. No involvement of the glenoid or articular surface. Electronically Signed   By: Kathreen Devoid   On: 03/13/2016 12:52   Dg Shoulder Left  03/13/2016  CLINICAL DATA:  slipped and fell on a roof today, has severe left shoulder pains, no axillary was performed, no other complaints EXAM: LEFT SHOULDER - 2+ VIEW COMPARISON:  None. FINDINGS: No evidence of humeral fracture or dislocation. Multiple bone fragments project over the lateral border of the scapula inferior to the glenoid. IMPRESSION: Findings consistent with fracture likely related to fracture of the scapula. Would suggest CT scan to better characterize. Electronically Signed   By: Skipper Cliche M.D.   On: 03/13/2016 11:45   I have personally reviewed and evaluated these images and lab results as part of my medical decision-making.   EKG Interpretation None      MDM   Final diagnoses:  Scapula fracture, left, closed, initial encounter   60 year old male here with left shoulder injury that occurred at work. He slipped and fell on a roof but did not fall off the roof. No head injury or loss consciousness. Denies any chest pain  or shortness of breath. Tenderness on exam of the left mid scapula. There is no gross deformity. His left arm remains neurovascularly intact. Unfortunately patient is left-hand dominant. X-ray was obtained with likely fracture of the scapula. CT scan was obtained for better characterization and this does confirm comminuted scapular fracture. Lungs views appear clear.  Patient remains without any chest pain or SOB.  Patient was placed in shoulder immobilizer. He has previously scheduled follow-up with his orthopedist, Dr. Rhona Raider, this afternoon at [place] him to discuss his injuries sustained today. His x-ray and CT scan uploaded to discs for physician review at appt this afternoon.  Rx percocet.  Discussed plan with patient, he/she acknowledged understanding and agreed with plan of care.  Return precautions given for new or worsening symptoms.  Larene Pickett, PA-C 03/13/16 [place], MD 03/13/16 [phone]

## 2016-03-13 NOTE — ED Notes (Signed)
Injury to left shoulder this am at work

## 2016-08-11 ENCOUNTER — Ambulatory Visit: Payer: Self-pay | Admitting: Cardiovascular Disease

## 2016-08-15 ENCOUNTER — Encounter: Payer: Self-pay | Admitting: Cardiovascular Disease

## 2016-08-17 ENCOUNTER — Other Ambulatory Visit: Payer: Self-pay | Admitting: Cardiovascular Disease

## 2016-08-25 ENCOUNTER — Ambulatory Visit (INDEPENDENT_AMBULATORY_CARE_PROVIDER_SITE_OTHER): Payer: BC Managed Care – PPO | Admitting: Cardiovascular Disease

## 2016-08-25 ENCOUNTER — Encounter: Payer: Self-pay | Admitting: Cardiovascular Disease

## 2016-08-25 VITALS — BP 120/72 | HR 72 | Ht 67.0 in | Wt 201.0 lb

## 2016-08-25 DIAGNOSIS — I5022 Chronic systolic (congestive) heart failure: Secondary | ICD-10-CM | POA: Diagnosis not present

## 2016-08-25 DIAGNOSIS — I2510 Atherosclerotic heart disease of native coronary artery without angina pectoris: Secondary | ICD-10-CM

## 2016-08-25 MED ORDER — ATORVASTATIN CALCIUM 80 MG PO TABS: 80.0000 mg | ORAL_TABLET | Freq: Every day | ORAL | 3 refills | Status: DC

## 2016-08-25 MED ORDER — RAMIPRIL 5 MG PO CAPS: 5.0000 mg | ORAL_CAPSULE | Freq: Every day | ORAL | 3 refills | Status: DC

## 2016-08-25 MED ORDER — CARVEDILOL 12.5 MG PO TABS: 12.5000 mg | ORAL_TABLET | Freq: Two times a day (BID) | ORAL | 3 refills | Status: DC

## 2016-08-25 MED ORDER — FUROSEMIDE 40 MG PO TABS: 40.0000 mg | ORAL_TABLET | Freq: Every day | ORAL | 3 refills | Status: DC

## 2016-08-25 MED ORDER — SPIRONOLACTONE 25 MG PO TABS: 12.5000 mg | ORAL_TABLET | Freq: Every day | ORAL | 3 refills | Status: DC

## 2016-08-25 NOTE — Patient Instructions (Signed)

## 2016-08-25 NOTE — Progress Notes (Signed)
[name] Date of Birth  08-14-1956 [facility] [address]    [place], [place]  [address] [phone]  Fax  [phone]   Problems: 1. CAD - s/p CABG, 2. Chronic systolic CHF 3. Hyperlipidemia   Mr.Ullom  Is a 60 year old gentleman with a history of coronary artery disease. Status post coronary artery bypass grafting. He has a history of congestive heart failure. His most recent ejection fraction was around 40% - 45%.  He denies episodes of chest pain or shortness of breath. He has had some episodes of dizziness that have occurred in the middle of the night. They resolved spontaneously.  Oct. 24, 2014:  [name] is doing well.  He has been having episodes of sneezing when he eats as he get full . No CP , no dyspnea.  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 26, 2015:  [name] is doing well.  He still quite busy working at [employer]. He's not having episodes of chest pain or shortness of breath.  He sneezes when he eats.  He has not had any chest pain or dyspnea.   Oct. 28, 2016:  [name] is feeling well.  Following his last visit we performed an echo card gram that showed that his left ventricular systolic function had decreased slightly to 30-35%.   We added Aldactone 12. 5 mg a day. Still sneezing while he is eating/when he is full. He's not having quite as much shortness of breath. His wife states that he seems to be breathing more comfortably at night.   Jan. 10, 2017: Doing about the same. We increased Altace during the last visit.  Has not noticed any significant difference.   December 27, 2015: [name] is doing well. His echo shows improvement of his LV function .  He needs to have left knee surgery  Also needs to have a colonoscopy   He is at low risk for both procedures.   Nov. 3, 2017:  [name] is doing well today , seen with wife [name]  Echocardiogram from 12/23/2015  shows slightly improved left ventricle systolic function to an ejection fraction of 40%. He has grade 2 diastolic dysfunction.  [name] and broke his shoulder in May .  Doing well , very active, no CP or dyspnea  Has been active , works out at [employer]. [name] had gastric bypass     Current Outpatient Prescriptions on File Prior to Visit  Medication Sig Dispense Refill  . aspirin EC 81 MG tablet Take 1 tablet (81 mg total) by mouth daily.    Marland Kitchen atorvastatin (LIPITOR) 80 MG tablet TAKE 1 TABLET BY MOUTH DAILY AT 6 PM. 90 tablet 1  . carvedilol (COREG) 12.5 MG tablet Take 1 tablet (12.5 mg total) by mouth 2 (two) times daily with a meal. 180 tablet 3  . furosemide (LASIX) 40 MG tablet TAKE 1 TABLET BY MOUTH DAILY. 90 tablet 1  . potassium chloride SA (K-DUR,KLOR-CON) 20 MEQ tablet TAKE 1 TABLET BY MOUTH DAILY. 90 tablet 1  . ramipril (ALTACE) 5 MG capsule TAKE 1 CAPSULE BY MOUTH DAILY. 90 capsule 1  . spironolactone (ALDACTONE) 25 MG tablet TAKE 1/2 TABLET BY MOUTH DAILY. 45 tablet 3   No current facility-administered medications on file prior to visit.     No Known Allergies  Past Medical History:  Diagnosis Date  . Allergic rhinitis   .  Allergy   . Bradycardia    He has a hx of  . Congestive heart failure (HCC)    EF equals 40%  . Coronary artery disease    Status post previous anterior wall myocardial infarction.   . Dyslipidemia    He has a hx  . GERD (gastroesophageal reflux disease)   . History of gastroesophageal reflux (GERD)   . Hypercholesterolemia    He has a hx of   . Hypertension   . Myocardial infarction 2008  . OSA on CPAP   . Sleep apnea    wears c-pap    Past Surgical History:  Procedure Laterality Date  . APPENDECTOMY  Age 16  . CARDIAC CATHETERIZATION     His EF is 40-45%. This has increased from 30% at the time of  last year(2008-9)  . CORONARY ARTERY BYPASS GRAFT    . KNEE SURGERY  2006  . SHOULDER SURGERY     left  . TONSILLECTOMY    . US  ECHOCARDIOGRAPHY  10-25-2007   Est EF 35-40%    History  Smoking Status  . Never Smoker  Smokeless Tobacco  . Never Used    History  Alcohol Use No    Comment: rare    Family History  Problem Relation Age of Onset  . Heart attack Father   . Kidney cancer Mother   . Colon cancer Neg Hx   . Esophageal cancer Neg Hx   . Pancreatic cancer Neg Hx   . Prostate cancer Neg Hx   . Rectal cancer Neg Hx   . Stomach cancer Neg Hx     Reviw of Systems:  Reviewed in the HPI.  All other systems are negative.  Physical Exam: BP 120/72   Pulse 72   Ht 5\' 7"  (1.702 m)   Wt 201 lb (91.2 kg)   BMI 31.48 kg/m  The patient is alert and oriented x 3.  The mood and affect are normal.   Skin: warm and dry.  Color is normal.    HEENT:   Normal carotids, no JVD  Lungs: clear   Heart: RR, no murmurs    Abdomen: soft, good BS  Extremities:  No C/C/E  Neuro:  Non focal    ECG: Nov. 3, 2017:   NSR at 72.   LAD , LVH with repol abn.    Assessment / Plan:   1. CAD - s/p CABG, -- no angina  2. Chronic systolic CHF- his ejection fraction is now up to 40%.  Continue current meds.  He seems to be tolerating them fairly well.   3. Hyperlipidemia - will check labs   4. Orthopedic injury:  He needs to have orthopedic surgery .   His LV function has improved since the last echo.   Continue current meds.   Will see him in 6 months with fasting labs .   Mertie Moores, MD  08/25/2016 4:33 PM    [address],  [place], [place]  [address] Pager [phone] Phone: [phone]; Fax: [phone]

## 2016-08-28 NOTE — Addendum Note (Signed)
Addended by: Velna Ochs on: 08/28/2016 02:04 PM   Modules accepted: Orders

## 2016-09-28 DIAGNOSIS — Z0000 Encounter for general adult medical examination without abnormal findings: Secondary | ICD-10-CM | POA: Insufficient documentation

## 2017-02-13 ENCOUNTER — Other Ambulatory Visit: Payer: Self-pay | Admitting: Cardiovascular Disease

## 2017-02-23 ENCOUNTER — Ambulatory Visit (INDEPENDENT_AMBULATORY_CARE_PROVIDER_SITE_OTHER): Payer: BC Managed Care – PPO | Admitting: Cardiovascular Disease

## 2017-02-23 ENCOUNTER — Encounter: Payer: Self-pay | Admitting: Cardiovascular Disease

## 2017-02-23 ENCOUNTER — Other Ambulatory Visit: Payer: BC Managed Care – PPO | Admitting: *Deleted

## 2017-02-23 VITALS — BP 118/70 | HR 54 | Ht 67.0 in | Wt 206.5 lb

## 2017-02-23 DIAGNOSIS — I10 Essential (primary) hypertension: Secondary | ICD-10-CM

## 2017-02-23 DIAGNOSIS — I2510 Atherosclerotic heart disease of native coronary artery without angina pectoris: Secondary | ICD-10-CM

## 2017-02-23 DIAGNOSIS — I5022 Chronic systolic (congestive) heart failure: Secondary | ICD-10-CM

## 2017-02-23 LAB — BASIC METABOLIC PANEL
BUN / CREAT RATIO: 19 (ref 10–24)
BUN: 18 mg/dL (ref 8–27)
CO2: 21 mmol/L (ref 18–29)
CREATININE: 0.96 mg/dL (ref 0.76–1.27)
Calcium: 9 mg/dL (ref 8.6–10.2)
Chloride: 99 mmol/L (ref 96–106)
GFR calc Af Amer: 99 mL/min/{1.73_m2} (ref >59)
GFR, EST NON AFRICAN AMERICAN: 86 mL/min/{1.73_m2} (ref >59)
Glucose: 88 mg/dL (ref 65–99)
POTASSIUM: 4.2 mmol/L (ref 3.5–5.2)
SODIUM: 138 mmol/L (ref 134–144)

## 2017-02-23 LAB — HEPATIC FUNCTION PANEL
ALBUMIN: 4.6 g/dL (ref 3.6–4.8)
ALT: 29 IU/L (ref 0–44)
AST: 26 IU/L (ref 0–40)
Alkaline Phosphatase: 69 IU/L (ref 39–117)
BILIRUBIN TOTAL: 1.1 mg/dL (ref 0.0–1.2)
BILIRUBIN, DIRECT: 0.29 mg/dL (ref 0.00–0.40)
Total Protein: 7.1 g/dL (ref 6.0–8.5)

## 2017-02-23 LAB — LIPID PANEL
Chol/HDL Ratio: 3 ratio (ref 0.0–5.0)
Cholesterol, Total: 113 mg/dL (ref 100–199)
HDL: 38 mg/dL — AB (ref >39)
LDL Calculated: 61 mg/dL (ref 0–99)
Triglycerides: 68 mg/dL (ref 0–149)
VLDL Cholesterol Cal: 14 mg/dL (ref 5–40)

## 2017-02-23 NOTE — Patient Instructions (Addendum)
Medication Instructions:  Your physician recommends that you continue on your current medications as directed. Please refer to the Current Medication list given to you today.   Labwork: Your physician recommends that you have a lipid profile/BMET/Liver profile today.   Testing/Procedures: None   Follow-Up: Your physician wants you to follow-up in: 6 months with Dr Acie Fredrickson. (November 2018).  You will receive a reminder letter in the mail two months in advance. If you don't receive a letter, please call our office to schedule the follow-up appointment.        If you need a refill on your cardiac medications before your next appointment, please call your pharmacy.

## 2017-02-23 NOTE — Progress Notes (Signed)
[place] Date of Birth  06/17/1956 [facility] [address]    [place], [place]  [address] [phone]  Fax  [phone]   Problems: 1. CAD - s/p CABG, 2. Chronic systolic CHF 3. Hyperlipidemia   [name]  Is a 61 y.o. gentleman with a history of coronary artery disease. Status post coronary artery bypass grafting. He has a history of congestive heart failure. His most recent ejection fraction was around 40% - 45%.  He denies episodes of chest pain or shortness of breath. He has had some episodes of dizziness that have occurred in the middle of the night. They resolved spontaneously.  Oct. 24, 2014:  [name] is doing well.  He has been having episodes of sneezing when he eats as he get full . No CP , no dyspnea.  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 26, 2015:  [name] is doing well.  He still quite busy working at [employer]. He's not having episodes of chest pain or shortness of breath.  He sneezes when he eats.  He has not had any chest pain or dyspnea.   Oct. 28, 2016:  [name] is feeling well.  Following his last visit we performed an echo card gram that showed that his left ventricular systolic function had decreased slightly to 30-35%.   We added Aldactone 12. 5 mg a day. Still sneezing while he is eating/when he is full. He's not having quite as much shortness of breath. His wife states that he seems to be breathing more comfortably at night.   Jan. 10, 2017: Doing about the same. We increased Altace during the last visit.  Has not noticed any significant difference.   December 27, 2015: [name] is doing well. His echo shows improvement of his LV function .  He needs to have left knee surgery  Also needs to have a colonoscopy   He is at low risk for both procedures.   Nov. 3, 2017:  [name] is doing well today , seen with wife [name]  Echocardiogram from 12/23/2015  shows slightly improved left ventricle systolic function to an ejection fraction of 40%. He has grade 2 diastolic dysfunction.  [name] and broke his shoulder in May .  Doing well , very active, no CP or dyspnea  Has been active , works out at [employer]. [name] had gastric bypass   Feb 23, 2017:  Has had knee surgery since I last saw him. No cardiac complications  No CP or dyspnea.    Current Outpatient Prescriptions on File Prior to Visit  Medication Sig Dispense Refill  . aspirin EC 81 MG tablet Take 1 tablet (81 mg total) by mouth daily.    Marland Kitchen atorvastatin (LIPITOR) 80 MG tablet Take 1 tablet (80 mg total) by mouth daily at 6 PM. 90 tablet 3  . carvedilol (COREG) 12.5 MG tablet Take 1 tablet (12.5 mg total) by mouth 2 (two) times daily with a meal. 180 tablet 3  . furosemide (LASIX) 40 MG tablet Take 1 tablet (40 mg total) by mouth daily. 90 tablet 3  . potassium chloride SA (K-DUR,KLOR-CON) 20 MEQ tablet TAKE 1 TABLET BY MOUTH DAILY. 90 tablet 1  . ramipril (ALTACE) 5 MG capsule Take 1 capsule (5 mg total) by mouth daily. 90 capsule 3  . spironolactone (ALDACTONE) 25 MG tablet Take 0.5 tablets (12.5 mg total) by  mouth daily. 45 tablet 3   No current facility-administered medications on file prior to visit.     No Known Allergies  Past Medical History:  Diagnosis Date  . Allergic rhinitis   . Allergy   . Bradycardia    He has a hx of  . Congestive heart failure (HCC)    EF equals 40%  . Coronary artery disease    Status post previous anterior wall myocardial infarction.   . Dyslipidemia    He has a hx  . GERD (gastroesophageal reflux disease)   . History of gastroesophageal reflux (GERD)   . Hypercholesterolemia    He has a hx of   . Hypertension   . Myocardial infarction ([place]) 2008  . OSA on CPAP   . Sleep apnea    wears c-pap    Past Surgical History:  Procedure Laterality Date  . APPENDECTOMY  Age 63  . CARDIAC CATHETERIZATION     His EF is 40-45%. This has  increased from 30% at the time of  last year(2008-9)  . CORONARY ARTERY BYPASS GRAFT    . KNEE SURGERY  2006  . SHOULDER SURGERY     left  . TONSILLECTOMY    . US ECHOCARDIOGRAPHY  10-25-2007   Est EF 35-40%    History  Smoking Status  . Never Smoker  Smokeless Tobacco  . Never Used    History  Alcohol Use No    Comment: rare    Family History  Problem Relation Age of Onset  . Heart attack Father   . Kidney cancer Mother   . Colon cancer Neg Hx   . Esophageal cancer Neg Hx   . Pancreatic cancer Neg Hx   . Prostate cancer Neg Hx   . Rectal cancer Neg Hx   . Stomach cancer Neg Hx     Reviw of Systems:  Reviewed in the HPI.  All other systems are negative.  Physical Exam: BP 118/70   Pulse (!) 54   Ht 5\' 7"  (1.702 m)   Wt 206 lb 8 oz (93.7 kg)   SpO2 98%   BMI 32.34 kg/m  The patient is alert and oriented x 3.  The mood and affect are normal.   Skin: warm and dry.  Color is normal.   HEENT:   Normal carotids, no JVD Lungs: clear  Heart: RR, no murmurs   Abdomen: soft, good BS Extremities:  No C/C/E Neuro:  Non focal    ECG:    Assessment / Plan:   1. CAD - s/p CABG, -- no angina Overall doing  Well  2. Chronic systolic CHF- his ejection fraction is now up to 40%.  Continue current meds.  He seems to be tolerating them fairly well.   3. Hyperlipidemia - will check labs today   4. s/p knee surgery :   [name] is doing well. His knee pain is much better.     Will see him in 6 months with fasting labs .   Mertie Moores, MD  02/23/2017 8:16 AM    [facility] [place],  [place], [place]  [address] Pager [phone] Phone: [phone]; Fax: [phone]

## 2017-02-26 NOTE — Addendum Note (Signed)
Addended by: Emmaline Life on: 02/26/2017 04:01 PM   Modules accepted: Orders

## 2017-03-05 ENCOUNTER — Encounter: Payer: Self-pay | Admitting: Internal Medicine

## 2017-03-06 ENCOUNTER — Encounter: Payer: Self-pay | Admitting: Internal Medicine

## 2017-03-06 ENCOUNTER — Ambulatory Visit (INDEPENDENT_AMBULATORY_CARE_PROVIDER_SITE_OTHER): Payer: BC Managed Care – PPO | Admitting: Internal Medicine

## 2017-03-06 VITALS — BP 114/72 | HR 58 | Ht 67.0 in | Wt 204.4 lb

## 2017-03-06 DIAGNOSIS — G4733 Obstructive sleep apnea (adult) (pediatric): Secondary | ICD-10-CM

## 2017-03-06 DIAGNOSIS — I5022 Chronic systolic (congestive) heart failure: Secondary | ICD-10-CM

## 2017-03-06 NOTE — Patient Instructions (Signed)
Order- DME [facility]- please refit CPAP mask of choice for better fit to help with compliance. Continue CPAP 9. iupplies, humidifier, AirView  Please call if we can help

## 2017-03-06 NOTE — Progress Notes (Signed)
HPI male never smoker followed for OSA, allergic rhinitis, complicated by HBP, CAD NPSG 02/22/11- AHI 24.2/ hr, desatutration to 86%, CPAP to 9, body weight 193 lbs  ----------------------------------------------------------------------------------  03/06/2016-61 year old male never smoker followed for OSA, allergic rhinitis, complicated by HBP, CAD CPAP 9/APS FOLLOWS FOR: DME [facility]. Pt states he wears CPAP nightly for at least 7 hours; pressure works well and no supplies needed at this time.  5/60/18- 29 yoM never smoker followed for OSA, allergic rhinitis, complicated by HBP, CAD CPAP 9/APS  OSA 1 yr f/u. Uses [employer] as DME.  CPAP download 78%/4 hours, AHI 3.8/hour. We reviewed the report and showed him the nights when he doesn't make 4 hours. Has rarely actually missed a night without wearing CPAP at all. Okay with CPAP 9. Mask shifts on his face and is bothersome. Little spring pollen rhinitis this year.  ROS-see HPI Constitutional:   No-   weight loss, night sweats, fevers, chills, fatigue, lassitude. HEENT:   No-  headaches, difficulty swallowing, tooth/dental problems, sore throat,       No-  sneezing, itching, ear ache, + nasal congestion, post nasal drip,  CV:  No-   chest pain, orthopnea, PND, swelling in lower extremities, anasarca,                                                        dizziness, palpitations Resp: No-   shortness of breath with exertion or at rest.              No-   productive cough,  No non-productive cough,  No- coughing up of blood.              No-   change in color of mucus.  No- wheezing.   Skin: No-   rash or lesions. GI:  No-   heartburn, indigestion, abdominal pain, nausea, vomiting,  GU:  MS:  No-   joint pain or swelling.  Neuro-     nothing unusual Psych:  No- change in mood or affect. No depression or anxiety.  No memory loss.  OBJ- Physical Exam   stable baseline exam General- Alert, Oriented, Affect-appropriate, Distress- none acute, Medium  build Skin- rash-none, lesions- none, excoriation- none Lymphadenopathy- none Head- atraumatic            Eyes- Gross vision intact, PERRLA, conjunctivae and secretions clear            Ears- Hearing, canals-normal            Nose- Clear, no-Septal dev, mucus, polyps, erosion, perforation             Throat- Mallampati IV , mucosa clear , drainage- none, tonsils- atrophic Neck- flexible , trachea midline, no stridor , thyroid nl, carotid no bruit Chest - symmetrical excursion , unlabored           Heart/CV- RRR , no murmur , no gallop  , no rub, nl s1 s2                           - JVD- none , edema- none, stasis changes- none, varices- none           Lung- clear to P&A, wheeze- none, cough- none , dullness-none, rub- none  Chest wall-  Abd-  Br/ Gen/ Rectal- Not done, not indicated Extrem- cyanosis- none, clubbing, none, atrophy- none, strength- nl Neuro- grossly intact to observation

## 2017-03-25 NOTE — Assessment & Plan Note (Signed)
We need to see if his DME company can help with mask fit for greater comfort and compliance. Otherwise we are doing well.

## 2017-03-25 NOTE — Assessment & Plan Note (Signed)
He is not in heart failure at this visit and describes good interaction with cardiology for stable control in recent months.

## 2017-08-09 ENCOUNTER — Other Ambulatory Visit: Payer: Self-pay | Admitting: Cardiovascular Disease

## 2017-10-05 ENCOUNTER — Ambulatory Visit: Payer: BC Managed Care – PPO | Admitting: Cardiovascular Disease

## 2017-10-05 ENCOUNTER — Encounter: Payer: Self-pay | Admitting: *Deleted

## 2017-10-11 ENCOUNTER — Encounter: Payer: Self-pay | Admitting: Cardiovascular Disease

## 2017-10-11 ENCOUNTER — Ambulatory Visit: Payer: BC Managed Care – PPO | Admitting: Cardiovascular Disease

## 2017-10-11 VITALS — BP 110/70 | HR 53 | Ht 67.0 in | Wt 199.1 lb

## 2017-10-11 DIAGNOSIS — I5022 Chronic systolic (congestive) heart failure: Secondary | ICD-10-CM

## 2017-10-11 DIAGNOSIS — I2510 Atherosclerotic heart disease of native coronary artery without angina pectoris: Secondary | ICD-10-CM

## 2017-10-11 LAB — HEPATIC FUNCTION PANEL
ALBUMIN: 4.9 g/dL — AB (ref 3.6–4.8)
ALK PHOS: 90 IU/L (ref 39–117)
ALT: 33 IU/L (ref 0–44)
AST: 24 IU/L (ref 0–40)
BILIRUBIN TOTAL: 1.2 mg/dL (ref 0.0–1.2)
BILIRUBIN, DIRECT: 0.27 mg/dL (ref 0.00–0.40)
TOTAL PROTEIN: 7.2 g/dL (ref 6.0–8.5)

## 2017-10-11 LAB — BASIC METABOLIC PANEL
BUN/Creatinine Ratio: 14 (ref 10–24)
BUN: 15 mg/dL (ref 8–27)
CALCIUM: 9.6 mg/dL (ref 8.6–10.2)
CHLORIDE: 101 mmol/L (ref 96–106)
CO2: 25 mmol/L (ref 20–29)
Creatinine, Ser: 1.08 mg/dL (ref 0.76–1.27)
GFR calc Af Amer: 85 mL/min/{1.73_m2} (ref >59)
GFR calc non Af Amer: 74 mL/min/{1.73_m2} (ref >59)
GLUCOSE: 94 mg/dL (ref 65–99)
Potassium: 4.2 mmol/L (ref 3.5–5.2)
SODIUM: 138 mmol/L (ref 134–144)

## 2017-10-11 LAB — LIPID PANEL
CHOL/HDL RATIO: 2.9 ratio (ref 0.0–5.0)
CHOLESTEROL TOTAL: 126 mg/dL (ref 100–199)
HDL: 44 mg/dL (ref >39)
LDL CALC: 65 mg/dL (ref 0–99)
Triglycerides: 84 mg/dL (ref 0–149)
VLDL Cholesterol Cal: 17 mg/dL (ref 5–40)

## 2017-10-11 NOTE — Patient Instructions (Signed)
Medication Instructions:  Your physician recommends that you continue on your current medications as directed. Please refer to the Current Medication list given to you today.  Labwork: Your physician recommends that you have lab work today- BMET, Lipid and Liver panel  Testing/Procedures: NONE  Follow-Up: Your physician wants you to follow-up in: 6 months with Dr. Acie Fredrickson. You will receive a reminder letter in the mail two months in advance. If you don't receive a letter, please call our office to schedule the follow-up appointment.   If you need a refill on your cardiac medications before your next appointment, please call your pharmacy.

## 2017-10-11 NOTE — Progress Notes (Signed)
[name] Date of Birth  1955-11-12 [facility] [address]    [place], [place]  [address] [phone]  Fax  [phone]   Problems: 1. CAD - s/p CABG, 2. Chronic systolic CHF 3. Hyperlipidemia   [name]  Is a 61 y.o. gentleman with a history of coronary artery disease. Status post coronary artery Huff grafting. He has a history of congestive heart failure. His most recent ejection fraction was around 40% - 45%.  He denies episodes of chest pain or shortness of breath. He has had some episodes of dizziness that have occurred in the middle of the night. They resolved spontaneously.  Oct. 24, 2014:  [name] is doing well.  He has been having episodes of sneezing when he eats as he get full . No CP , no dyspnea.  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 26, 2015:  [name] is doing well.  He still quite busy working at [employer]. He's not having episodes of chest pain or shortness of breath.  He sneezes when he eats.  He has not had any chest pain or dyspnea.   Oct. 28, 2016:  [name] is feeling well.  Following his last visit we performed an echo card gram that showed that his left ventricular systolic function had decreased slightly to 30-35%.   We added Aldactone 12. 5 mg a day. Still sneezing while he is eating/when he is full. He's not having quite as much shortness of breath. His wife states that he seems to be breathing more comfortably at night.   Jan. 10, 2017: Doing about the same. We increased Altace during the last visit.  Has not noticed any significant difference.   December 27, 2015: [name] is doing well. His echo shows improvement of his LV function .  He needs to have left knee surgery  Also needs to have a colonoscopy   He is at low risk for both procedures.   Nov. 3, 2017:  [name] is doing well today , seen with wife [name]  Echocardiogram from 12/23/2015  shows slightly improved left ventricle systolic function to an ejection fraction of 40%. He has grade 2 diastolic dysfunction.  [name] and broke his shoulder in May .  Doing well , very active, no CP or dyspnea  Has been active , works out at [employer]. [name]   Feb 23, 2017:  Has had knee surgery since I last saw him. No cardiac complications  No CP or dyspnea.    Dec. 20, 2018:  Doing well No CP or dyspnea   Current Outpatient Medications on File Prior to Visit  Medication Sig Dispense Refill  . aspirin EC 81 MG tablet Take 1 tablet (81 mg total) by mouth daily.    Marland Kitchen atorvastatin (LIPITOR) 80 MG tablet Take 1 tablet (80 mg total) by mouth daily at 6 PM. 90 tablet 3  . carvedilol (COREG) 12.5 MG tablet Take 1 tablet (12.5 mg total) by mouth 2 (two) times daily with a meal. 180 tablet 3  . furosemide (LASIX) 40 MG tablet Take 1 tablet (40 mg total) by mouth daily. 90 tablet 3  . potassium chloride SA (K-DUR,KLOR-CON) 20 MEQ tablet TAKE ONE TABLET BY MOUTH ONE TIME DAILY  90 tablet 3  . ramipril (ALTACE) 5 MG capsule Take 1 capsule (5 mg total) by mouth daily. 90 capsule  3  . spironolactone (ALDACTONE) 25 MG tablet Take 0.5 tablets (12.5 mg total) by mouth daily. 45 tablet 3   No current facility-administered medications on file prior to visit.     No Known Allergies  Past Medical History:  Diagnosis Date  . Allergic rhinitis   . Allergy   . Bradycardia    He has a hx of  . Congestive heart failure (HCC)    EF equals 40%  . Coronary artery disease    Status post previous anterior wall myocardial infarction.   . Dyslipidemia    He has a hx  . GERD (gastroesophageal reflux disease)   . History of gastroesophageal reflux (GERD)   . Hypercholesterolemia    He has a hx of   . Hypertension   . Myocardial infarction ([place]) 2008  . OSA on CPAP   . Sleep apnea    wears c-pap    Past Surgical History:  Procedure Laterality Date  . APPENDECTOMY  Age 66   . CARDIAC CATHETERIZATION     His EF is 40-45%. This has increased from 30% at the time of  last year(2008-9)  . CORONARY ARTERY Huff GRAFT    . KNEE SURGERY  2006  . SHOULDER SURGERY     left  . TONSILLECTOMY    . US ECHOCARDIOGRAPHY  10-25-2007   Est EF 35-40%    Social History   Tobacco Use  Smoking Status Never Smoker  Smokeless Tobacco Never Used    Social History   Substance and Sexual Activity  Alcohol Use No  . Alcohol/week: 0.0 oz   Comment: rare    Family History  Problem Relation Age of Onset  . Heart attack Father   . Kidney cancer Mother   . Colon cancer Neg Hx   . Esophageal cancer Neg Hx   . Pancreatic cancer Neg Hx   . Prostate cancer Neg Hx   . Rectal cancer Neg Hx   . Stomach cancer Neg Hx     Reviw of Systems:  Reviewed in the HPI.  All other systems are negative.  Physical Exam: Blood pressure 110/70, pulse (!) 53, height 5\' 7"  (1.702 m), weight 199 lb 1.9 oz (90.3 kg), SpO2 97 %.  GEN:  Well nourished, well developed in no acute distress HEENT: Normal NECK: No JVD; No carotid bruits LYMPHATICS: No lymphadenopathy CARDIAC: RR, no murmurs, rubs, gallops RESPIRATORY:  Clear to auscultation without rales, wheezing or rhonchi  ABDOMEN: Soft, non-tender, non-distended MUSCULOSKELETAL:  No edema; No deformity  SKIN: Warm and dry NEUROLOGIC:  Alert and oriented x 3  ECG:    Assessment / Plan:   1. CAD - s/p CABG, - Doing well.   No angina   2. Chronic systolic CHF-   Breathing is good  Blood pressures well controlled.  Continue current medications.  3. Hyperlipidemia - on Atrovastatin 80 mg a day .  Check labs today    Will see him in 6 months     Mertie Moores, MD  10/11/2017 12:06 PM    [facility],  [place], [place]  [address] Pager [phone] Phone: [phone]; Fax: [phone]

## 2017-11-12 ENCOUNTER — Other Ambulatory Visit: Payer: Self-pay | Admitting: Cardiovascular Disease

## 2017-11-12 MED ORDER — FUROSEMIDE 40 MG PO TABS: 40.0000 mg | ORAL_TABLET | Freq: Every day | ORAL | 3 refills | Status: DC

## 2018-04-09 ENCOUNTER — Encounter: Payer: Self-pay | Admitting: Internal Medicine

## 2018-04-10 ENCOUNTER — Encounter: Payer: Self-pay | Admitting: Internal Medicine

## 2018-04-10 ENCOUNTER — Ambulatory Visit: Payer: BC Managed Care – PPO | Admitting: Internal Medicine

## 2018-04-10 DIAGNOSIS — G4733 Obstructive sleep apnea (adult) (pediatric): Secondary | ICD-10-CM

## 2018-04-10 DIAGNOSIS — J3089 Other allergic rhinitis: Secondary | ICD-10-CM | POA: Diagnosis not present

## 2018-04-10 DIAGNOSIS — J302 Other seasonal allergic rhinitis: Secondary | ICD-10-CM

## 2018-04-10 NOTE — Assessment & Plan Note (Signed)
Past peak spring pollen season and not having problems at this time.

## 2018-04-10 NOTE — Assessment & Plan Note (Signed)
He continues to benefit from CPAP with improved sleep and prevention of snoring.  Download shows excellent compliance and control.  He may be able to refit mask to prevent the cheek biting.  I also suggested he look at over-the-counter bruxism guards which might help as well.

## 2018-04-10 NOTE — Progress Notes (Signed)
HPI male never smoker followed for OSA, allergic rhinitis, complicated by HBP, CAD/ MI, CHF, GERD NPSG 02/22/11- AHI 24.2/ hr, desatutration to 86%, CPAP to 9, body weight 193 lbs  ---------------------------------------------------------------------------------- 02/25/17- 55 yoM never smoker followed for OSA, allergic rhinitis, complicated by HBP, CAD CPAP 9/APS  OSA 1 yr f/u. Uses [facility] as DME.  CPAP download 78%/4 hours, AHI 3.8/hour. We reviewed the report and showed him the nights when he doesn't make 4 hours. Has rarely actually missed a night without wearing CPAP at all. Okay with CPAP 9. Mask shifts on his face and is bothersome. Little spring pollen rhinitis this year.  04/10/2018- 76 yoM never smoker followed for OSA, allergic rhinitis, complicated by HBP, CAD/MI, CHF, GERD CPAP 9/APS  -----OSA; DME: [facility]. Pt wears CPAP nightly and DL attached. Pressure works well. Download 89% compliance AHI 3.6/hour Pressure from his headgear causes him to bite the inside of his cheek. Sleeps better with CPAP and wife tells him he is not snoring.  He feels he gets enough sleep. 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  ROS-see HPI + = positive Constitutional:   No-   weight loss, night sweats, fevers, chills, fatigue, lassitude. HEENT:   No-  headaches, difficulty swallowing, tooth/dental problems, sore throat,       No-  sneezing, itching, ear ache, + nasal congestion, post nasal drip,  CV:  No-   chest pain, orthopnea, PND, swelling in lower extremities, anasarca,                                                      dizziness, palpitations Resp: No-   shortness of breath with exertion or at rest.              No-   productive cough,  No non-productive cough,  No- coughing up of blood.              No-   change in color of mucus.  No- wheezing.   Skin: No-   rash or lesions. GI:  No-   heartburn, indigestion, abdominal pain, nausea, vomiting,  GU:  MS:  No-   joint pain  or swelling.  Neuro-     nothing unusual Psych:  No- change in mood or affect. No depression or anxiety.  No memory loss.  OBJ- Physical Exam   stable baseline exam General- Alert, Oriented, Affect-appropriate, Distress- none acute, Medium build Skin- rash-none, lesions- none, excoriation- none Lymphadenopathy- none Head- atraumatic            Eyes- Gross vision intact, PERRLA, conjunctivae and secretions clear            Ears- Hearing, canals-normal            Nose- Clear, no-Septal dev, mucus, polyps, erosion, perforation             Throat- Mallampati IV , mucosa clear , drainage- none, tonsils- atrophic Neck- flexible , trachea midline, no stridor , thyroid nl, carotid no bruit Chest - symmetrical excursion , unlabored           Heart/CV- RRR , no murmur , no gallop  , no rub, nl s1 s2                           -  JVD- none , edema- none, stasis changes- none, varices- none           Lung- clear to P&A, wheeze- none, cough- none , dullness-none, rub- none           Chest wall-  Abd-  Br/ Gen/ Rectal- Not done, not indicated Extrem- cyanosis- none, clubbing, none, atrophy- none, strength- nl Neuro- grossly intact to observation

## 2018-04-10 NOTE — Patient Instructions (Signed)
Order- DME [facility]  We can continue CPAP 9, mask of choice, humidifier, supplies, AirView               Please refit mask and headgear to help him stop biting his cheek.  Consider trying one of the mouthpieces sold in drug stores to prevent tooth grinding (bruxism).  Please call if we can help.

## 2018-06-21 ENCOUNTER — Encounter

## 2018-06-21 ENCOUNTER — Ambulatory Visit: Payer: BC Managed Care – PPO | Admitting: Cardiovascular Disease

## 2018-06-21 ENCOUNTER — Encounter: Payer: Self-pay | Admitting: Cardiovascular Disease

## 2018-06-21 VITALS — BP 118/72 | HR 56 | Ht 67.0 in | Wt 205.8 lb

## 2018-06-21 DIAGNOSIS — I5022 Chronic systolic (congestive) heart failure: Secondary | ICD-10-CM

## 2018-06-21 DIAGNOSIS — I2510 Atherosclerotic heart disease of native coronary artery without angina pectoris: Secondary | ICD-10-CM

## 2018-06-21 LAB — HEPATIC FUNCTION PANEL
ALBUMIN: 4.6 g/dL (ref 3.6–4.8)
ALT: 26 IU/L (ref 0–44)
AST: 21 IU/L (ref 0–40)
Alkaline Phosphatase: 81 IU/L (ref 39–117)
BILIRUBIN TOTAL: 1.2 mg/dL (ref 0.0–1.2)
Bilirubin, Direct: 0.3 mg/dL (ref 0.00–0.40)
Total Protein: 7 g/dL (ref 6.0–8.5)

## 2018-06-21 LAB — LIPID PANEL
Chol/HDL Ratio: 3.3 ratio (ref 0.0–5.0)
Cholesterol, Total: 121 mg/dL (ref 100–199)
HDL: 37 mg/dL — AB (ref >39)
LDL Calculated: 59 mg/dL (ref 0–99)
Triglycerides: 123 mg/dL (ref 0–149)
VLDL CHOLESTEROL CAL: 25 mg/dL (ref 5–40)

## 2018-06-21 LAB — BASIC METABOLIC PANEL
BUN/Creatinine Ratio: 14 (ref 10–24)
BUN: 15 mg/dL (ref 8–27)
CO2: 22 mmol/L (ref 20–29)
CREATININE: 1.05 mg/dL (ref 0.76–1.27)
Calcium: 9.6 mg/dL (ref 8.6–10.2)
Chloride: 100 mmol/L (ref 96–106)
GFR calc Af Amer: 88 mL/min/{1.73_m2} (ref >59)
GFR, EST NON AFRICAN AMERICAN: 76 mL/min/{1.73_m2} (ref >59)
Glucose: 99 mg/dL (ref 65–99)
POTASSIUM: 4.1 mmol/L (ref 3.5–5.2)
Sodium: 138 mmol/L (ref 134–144)

## 2018-06-21 NOTE — Patient Instructions (Signed)
Medication Instructions:  Your physician recommends that you continue on your current medications as directed. Please refer to the Current Medication list given to you today.   Labwork: TODAY - basic metabolic panel, cholesterol, liver panel   Testing/Procedures: None Ordered   Follow-Up: Your physician wants you to follow-up in: 6 months with a PA or Nurse Practitioner on Dr. Elmarie Shiley team.  You will receive a reminder letter in the mail two months in advance. If you don't receive a letter, please call our office to schedule the follow-up appointment.   If you need a refill on your cardiac medications before your next appointment, please call your pharmacy.   Thank you for choosing [facility]! Christen Bame, RN [phone]

## 2018-06-21 NOTE — Progress Notes (Signed)
Rogers [place] Date of Birth  [birth year] [facility] [address]    [place], [place]  [address] [phone]  Fax  [phone]   Problems: 1. CAD - s/p CABG, 2. Chronic systolic CHF 3. Hyperlipidemia   [name]  Is a 62 y.o. gentleman with a history of coronary artery disease. Status post coronary artery bypass grafting. He has a history of congestive heart failure. His most recent ejection fraction was around 40% - 45%.  He denies episodes of chest pain or shortness of breath. He has had some episodes of dizziness that have occurred in the middle of the night. They resolved spontaneously.  Oct. 24, 2014:  [name] is doing well.  He has been having episodes of sneezing when he eats as he get full . No CP , no dyspnea.  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 26, 2015:  [name] is doing well.  He still quite busy working at [employer]. He's not having episodes of chest pain or shortness of breath.  He sneezes when he eats.  He has not had any chest pain or dyspnea.   Oct. 28, 2016:  [name] is feeling well.  Following his last visit we performed an echo card gram that showed that his left ventricular systolic function had decreased slightly to 30-35%.   We added Aldactone 12. 5 mg a day. Still sneezing while he is eating/when he is full. He's not having quite as much shortness of breath. His wife states that he seems to be breathing more comfortably at night.   Jan. 10, 2017: Doing about the same. We increased Altace during the last visit.  Has not noticed any significant difference.   December 27, 2015: [name] is doing well. His echo shows improvement of his LV function .  He needs to have left knee surgery  Also needs to have a colonoscopy   He is at low risk for both procedures.   Nov. 3, 2017:  [name] is doing well today , seen with wife [name]  Echocardiogram from 12/23/2015  shows slightly improved left ventricle systolic function to an ejection fraction of 40%. He has grade 2 diastolic dysfunction.  [name] and broke his shoulder in May .  Doing well , very active, no CP or dyspnea  Has been active , works out at [employer]. [name] had gastric bypass   Feb 23, 2017:  Has had knee surgery since I last saw him. No cardiac complications  No CP or dyspnea.    Dec. 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 aspirin EC 81 MG tablet Take 1 tablet (81 mg total) by mouth daily.    Marland Kitchen atorvastatin (LIPITOR) 80 MG tablet TAKE 1 TABLET (80 MG TOTAL) BY MOUTH DAILY AT 6 PM. 90 tablet 3  . carvedilol (COREG) 12.5 MG tablet TAKE 1 TABLET (12.5 MG TOTAL) BY MOUTH 2 (TWO) TIMES DAILY WITH A MEAL. 180 tablet 3  . furosemide (LASIX) 40 MG tablet Take 1 tablet (40 mg total) by mouth daily. 90 tablet 3  . potassium chloride SA (K-DUR,KLOR-CON)  20 MEQ tablet TAKE ONE TABLET BY MOUTH ONE TIME DAILY  90 tablet 3  . ramipril (ALTACE) 5 MG capsule TAKE 1 CAPSULE (5 MG TOTAL) BY MOUTH DAILY. 90 capsule 3  . spironolactone (ALDACTONE) 25 MG tablet TAKE 0.5 TABLETS (12.5 MGTOTAL) BY MOUTH DAILY. 45 tablet 3   No current facility-administered medications on file prior to visit.     No Known Allergies  Past Medical History:  Diagnosis Date  . Allergic rhinitis   . Allergy   . Bradycardia    He has a hx of  . Congestive heart failure (HCC)    EF equals 40%  . Coronary artery disease    Status post previous anterior wall myocardial infarction.   . Dyslipidemia    He has a hx  . GERD (gastroesophageal reflux disease)   . History of gastroesophageal reflux (GERD)   . Hypercholesterolemia    He has a hx of   .  Hypertension   . Myocardial infarction ([place]) 2008  . OSA on CPAP   . Sleep apnea    wears c-pap    Past Surgical History:  Procedure Laterality Date  . APPENDECTOMY  Age 42  . CARDIAC CATHETERIZATION     His EF is 40-45%. This has increased from 30% at the time of  last year(2008-9)  . CORONARY ARTERY BYPASS GRAFT    . KNEE SURGERY  2006  . SHOULDER SURGERY     left  . TONSILLECTOMY    . US ECHOCARDIOGRAPHY  10-25-2007   Est EF 35-40%    Social History   Tobacco Use  Smoking Status Never Smoker  Smokeless Tobacco Never Used    Social History   Substance and Sexual Activity  Alcohol Use No  . Alcohol/week: 0.0 standard drinks   Comment: rare    Family History  Problem Relation Age of Onset  . Heart attack Father   . Kidney cancer Mother   . Colon cancer Neg Hx   . Esophageal cancer Neg Hx   . Pancreatic cancer Neg Hx   . Prostate cancer Neg Hx   . Rectal cancer Neg Hx   . Stomach cancer Neg Hx     Reviw of Systems:  Reviewed in the HPI.  All other systems are negative.   Physical Exam: Blood pressure 118/72, pulse (!) 56, height 5\' 7"  (1.702 m), weight 205 lb 12.8 oz (93.4 kg).  GEN:  Well nourished, well developed in no acute distress HEENT: Normal NECK: No JVD; No carotid bruits LYMPHATICS: No lymphadenopathy CARDIAC: RRR  RESPIRATORY:  Clear to auscultation without rales, wheezing or rhonchi  ABDOMEN: Soft, non-tender, non-distended MUSCULOSKELETAL:  No edema; No deformity  SKIN: Warm and dry NEUROLOGIC:  Alert and oriented x 3   ECG:   June 21, 2018: Sinus bradycardia 56 beats minute.  Left axis deviation.  Left bundle branch block.  Assessment / Plan:   1. CAD - s/p CABG, - He is doing great.  Is not having any angina.  2. Chronic systolic CHF-   no signs or symptoms of worsening heart failure.  He watches his salt intake.  3. Hyperlipidemia -atorvastatin.  His last labs look great.  Will check fasting labs today. Will see him in 6  months     Sean Moores, MD  06/21/2018 9:52 AM    [name],  [place], [place]  [address] Pager [phone] Phone: [phone]; Fax: [phone]

## 2018-08-14 ENCOUNTER — Other Ambulatory Visit: Payer: Self-pay | Admitting: Cardiovascular Disease

## 2018-11-12 ENCOUNTER — Other Ambulatory Visit: Payer: Self-pay | Admitting: Cardiovascular Disease

## 2018-12-18 NOTE — Progress Notes (Signed)
Cardiology Office Note    Date:  12/19/2018   ID:  Castle, Lamons 1956-01-12, MRN [id number]  PCP:  Bernerd Limbo, MD  Cardiologist:  Dr. Acie Fredrickson   Chief Complaint: 6 Months follow up  History of Present Illness:   [name] is a 63 y.o. male with history of CAD status post CABG, chronic systolic heart failure, left bundle branch block, bradycardia, hyperlipidemia and obstructive sleep apnea on CPAP presents for six-month follow-up.  Last echocardiogram March 2017 showed LV function of 40% with grade 2 diastolic dysfunction.  He was doing well on cardiac standpoint when last seen by Dr. Acie Fredrickson August 2019.  Here today for six-month follow-up.  Patient has exertional dyspnea.  For the past few months patient has dyspnea while walking for about 1 mile and while doing yard work.  Resolves with rest.  No associated chest pain/pressure, diaphoresis, nausea, or vomiting.  He cannot recall his symptoms when he had CABG. He is retired 6 months ago and since then has gained 5 pounds due to less active lifestyle.  His dyspnea has been stable for months.  He denies orthopnea, PND, syncope, dizziness, lower extremity edema, palpitation or blood in his stool or urine.  Compliant with low-sodium diet and medication.  Past Medical History:  Diagnosis Date  . Allergic rhinitis   . Allergy   . Bradycardia    He has a hx of  . Congestive heart failure (HCC)    EF equals 40%  . Coronary artery disease    Status post previous anterior wall myocardial infarction.   . Dyslipidemia    He has a hx  . GERD (gastroesophageal reflux disease)   . History of gastroesophageal reflux (GERD)   . Hypercholesterolemia    He has a hx of   . Hypertension   . Myocardial infarction ([place]) 2008  . OSA on CPAP   . Sleep apnea    wears c-pap    Past Surgical History:  Procedure Laterality Date  . APPENDECTOMY  Age 31  . CARDIAC CATHETERIZATION     His EF is 40-45%. This has increased from 30% at  the time of  last year(2008-9)  . CORONARY ARTERY BYPASS GRAFT    . KNEE SURGERY  2006  . SHOULDER SURGERY     left  . TONSILLECTOMY    . US ECHOCARDIOGRAPHY  10-25-2007   Est EF 35-40%    Current Medications: Prior to Admission medications   Medication Sig Start Date End Date Taking? Authorizing Provider  aspirin EC 81 MG tablet Take 1 tablet (81 mg total) by mouth daily. 08/17/14   Nahser, Wonda Cheng, MD  atorvastatin (LIPITOR) 80 MG tablet TAKE ONE TABLET BY MOUTH ONE TIME DAILY AT 6PM 11/13/18   Nahser, Wonda Cheng, MD  carvedilol (COREG) 12.5 MG tablet TAKE ONE TABLET BY MOUTH TWICE DAILY WITH A MEAL 11/13/18   Nahser, Wonda Cheng, MD  furosemide (LASIX) 40 MG tablet TAKE ONE TABLET BY MOUTH ONE TIME DAILY  11/13/18   Nahser, Wonda Cheng, MD  potassium chloride SA (K-DUR,KLOR-CON) 20 MEQ tablet TAKE ONE TABLET BY MOUTH ONE TIME DAILY  08/14/18   Nahser, Wonda Cheng, MD  ramipril (ALTACE) 5 MG capsule TAKE ONE CAPSULE BY MOUTH ONE TIME DAILY  11/13/18   Nahser, Wonda Cheng, MD  spironolactone (ALDACTONE) 25 MG tablet TAKE HALF TABLET BY MOUTH DAILY  11/13/18   Nahser, Wonda Cheng, MD    Allergies:   Patient has no known allergies.  Social History   Socioeconomic History  . Marital status: Married    Spouse name: Not on file  . Number of children: Not on file  . Years of education: Not on file  . Highest education level: Not on file  Occupational History  . Occupation: high school custodian  Social Needs  . Financial resource strain: Not on file  . Food insecurity:    Worry: Not on file    Inability: Not on file  . Transportation needs:    Medical: Not on file    Non-medical: Not on file  Tobacco Use  . Smoking status: Never Smoker  . Smokeless tobacco: Never Used  Substance and Sexual Activity  . Alcohol use: No    Alcohol/week: 0.0 standard drinks    Comment: rare  . Drug use: No  . Sexual activity: Not on file  Lifestyle  . Physical activity:    Days per week: Not on file    Minutes  per session: Not on file  . Stress: Not on file  Relationships  . Social connections:    Talks on phone: Not on file    Gets together: Not on file    Attends religious service: Not on file    Active member of club or organization: Not on file    Attends meetings of clubs or organizations: Not on file    Relationship status: Not on file  Other Topics Concern  . Not on file  Social History Narrative  . Not on file     Family History:  The patient's family history includes Heart attack in his father; Kidney cancer in his mother.   ROS:   Please see the history of present illness.    ROS All other systems reviewed and are negative.   PHYSICAL EXAM:   VS:  BP 112/68   Pulse 61   Ht 5\' 7"  (1.702 m)   Wt 210 lb 12.8 oz (95.6 kg)   SpO2 96%   BMI 33.02 kg/m    GEN: Well nourished, well developed, in no acute distress  HEENT: normal  Neck: no JVD, carotid bruits, or masses Cardiac: RRR; no murmurs, rubs, or gallops,no edema  Respiratory:  clear to auscultation bilaterally, normal work of breathing GI: soft, nontender, nondistended, + BS MS: no deformity or atrophy  Skin: warm and dry, no rash Neuro:  Alert and Oriented x 3, Strength and sensation are intact Psych: euthymic mood, full affect  Wt Readings from Last 3 Encounters:  12/19/18 210 lb 12.8 oz (95.6 kg)  06/21/18 205 lb 12.8 oz (93.4 kg)  04/10/18 204 lb 9.6 oz (92.8 kg)      Studies/Labs Reviewed:   EKG:  EKG is not  ordered today.    Recent Labs: 06/21/2018: ALT 26; BUN 15; Creatinine, Ser 1.05; Potassium 4.1; Sodium 138   Lipid Panel    Component Value Date/Time   CHOL 121 06/21/2018 1021   TRIG 123 06/21/2018 1021   HDL 37 (L) 06/21/2018 1021   CHOLHDL 3.3 06/21/2018 1021   CHOLHDL 4 08/17/2014 0926   VLDL 22.2 08/17/2014 0926   LDLCALC 59 06/21/2018 1021    Additional studies/ records that were reviewed today include:   Echocardiogram: 12/2015 Study Conclusions  - Left ventricle: The cavity  size was normal. Apical septal, apical   inferior and true apex akinesis. Anterolateral hypokinesis. The   estimated ejection fraction was 40%. Features are consistent with   a pseudonormal left ventricular filling pattern, with  concomitant   abnormal relaxation and increased filling pressure (grade 2   diastolic dysfunction). - Aortic valve: There was no stenosis. There was trivial   regurgitation. - Mitral valve: There was trivial regurgitation. - Left atrium: The atrium was moderately dilated. - Right ventricle: The cavity size was normal. Systolic function   was mildly reduced. - Tricuspid valve: Peak RV-RA gradient (S): 25 mm Hg. - Pulmonary arteries: PA peak pressure: 28 mm Hg (S). - Inferior vena cava: The vessel was normal in size. The   respirophasic diameter changes were in the normal range (= 50%),   consistent with normal central venous pressure.  Impressions:  - Technically difficult study with poor acoustic windows. Use of   echo contrast was not done but may have helped. Normal LV size   with EF 40%. Wall motion abnormalities as noted above. Normal RV   size with mildly decreased systolic function. No significant   valvular abnormalities.     ASSESSMENT & PLAN:   1.  Dyspnea on exertion -Ongoing for past 6 months.  Stable.  No associated symptoms.  Differential includes anginal equivalent, deconditioning or worsening of diastolic dysfunction however he denies chest pain/pressure, orthopnea, PND or lower extremity edema.  He uses low-sodium diet. -After long discussion, will wait and observe his symptoms.  He will try to get more active.  If symptoms worsens he will give [place] call.  May consider stress test at follow-up.  2. CAD status post CABG -As above.  Continue aspirin, statin and beta-blocker.  3. Chronic systolic heart failure -Euvolemic.  Continue Coreg, Spironolactone, ramipril and Lasix.  4.   Hyperlipidemia -Continue high intensity statin.  LDL at  goal.   Medication Adjustments/Labs and Tests Ordered: Current medicines are reviewed at length with the patient today.  Concerns regarding medicines are outlined above.  Medication changes, Labs and Tests ordered today are listed in the Patient Instructions below. Patient Instructions  Medication Instructions:  Your physician recommends that you continue on your current medications as directed. Please refer to the Current Medication list given to you today.   If you need a refill on your cardiac medications before your next appointment, please call your pharmacy.   Lab work: NONE  If you have labs (blood work) drawn today and your tests are completely normal, you will receive your results only by: Marland Kitchen MyChart Message (if you have MyChart) OR . A paper copy in the mail If you have any lab test that is abnormal or we need to change your treatment, we will call you to review the results.  Testing/Procedures: NONE  Follow-Up: At [facility], you and your health needs are our priority.  As part of our continuing mission to provide you with exceptional heart care, we have created designated Provider Care Teams.  These Care Teams include your primary Cardiologist (physician) and Advanced Practice Providers (APPs -  Physician Assistants and Nurse Practitioners) who all work together to provide you with the care you need, when you need it. . You will need a follow up appointment in:  3-4 months with Mertie Moores, MD or one of the following Advanced Practice Providers on your designated Care Team: . Richardson Dopp, PA-C . Vin Pernell Lenoir, PA-C . Daune Perch, NP  Any Other Special Instructions Will Be Listed Below (If Applicable).       Mahalia Longest [place], [place]  12/19/2018 10:15 AM    [place], [place], [place]  [address] Phone: [phone]; Fax: [phone]

## 2018-12-19 ENCOUNTER — Encounter: Payer: Self-pay | Admitting: Physician Assistant

## 2018-12-19 ENCOUNTER — Ambulatory Visit: Payer: BC Managed Care – PPO | Admitting: Physician Assistant

## 2018-12-19 VITALS — BP 112/68 | HR 61 | Ht 67.0 in | Wt 210.8 lb

## 2018-12-19 DIAGNOSIS — E785 Hyperlipidemia, unspecified: Secondary | ICD-10-CM | POA: Diagnosis not present

## 2018-12-19 DIAGNOSIS — R0609 Other forms of dyspnea: Secondary | ICD-10-CM | POA: Diagnosis not present

## 2018-12-19 DIAGNOSIS — I5022 Chronic systolic (congestive) heart failure: Secondary | ICD-10-CM | POA: Diagnosis not present

## 2018-12-19 DIAGNOSIS — I2510 Atherosclerotic heart disease of native coronary artery without angina pectoris: Secondary | ICD-10-CM

## 2018-12-19 NOTE — Patient Instructions (Signed)
Medication Instructions:  Your physician recommends that you continue on your current medications as directed. Please refer to the Current Medication list given to you today.   If you need a refill on your cardiac medications before your next appointment, please call your pharmacy.   Lab work: NONE  If you have labs (blood work) drawn today and your tests are completely normal, you will receive your results only by: Marland Kitchen MyChart Message (if you have MyChart) OR . A paper copy in the mail If you have any lab test that is abnormal or we need to change your treatment, we will call you to review the results.  Testing/Procedures: NONE  Follow-Up: At [facility], you and your health needs are our priority.  As part of our continuing mission to provide you with exceptional heart care, we have created designated Provider Care Teams.  These Care Teams include your primary Cardiologist (physician) and Advanced Practice Providers (APPs -  Physician Assistants and Nurse Practitioners) who all work together to provide you with the care you need, when you need it. . You will need a follow up appointment in:  3-4 months with Mertie Moores, MD or one of the following Advanced Practice Providers on your designated Care Team: . Richardson Dopp, PA-C . Vin Bhagat, PA-C . Daune Perch, NP  Any Other Special Instructions Will Be Listed Below (If Applicable).

## 2019-03-18 ENCOUNTER — Telehealth: Payer: Self-pay | Admitting: *Deleted

## 2019-03-18 NOTE — Telephone Encounter (Signed)
Call placed to pt to try to convert his appt to virtual.  Left pt a message to call back.

## 2019-03-19 ENCOUNTER — Other Ambulatory Visit: Payer: Self-pay

## 2019-03-19 ENCOUNTER — Ambulatory Visit: Payer: BC Managed Care – PPO | Admitting: Physician Assistant

## 2019-03-19 NOTE — Progress Notes (Deleted)
Cardiology Office Note    Date:  03/19/2019   ID:  [name], [name] 1956/10/14, MRN [id number]  PCP:  Bernerd Limbo, MD  Cardiologist:  ***  Electrophysiologist: ***  Chief Complaint: Hospital follow up s/p    /  Months follow up  History of Present Illness:   [name] is a 63 y.o. male ***   Past Medical History:  Diagnosis Date  . Allergic rhinitis   . Allergy   . Bradycardia    He has a hx of  . Congestive heart failure (HCC)    EF equals 40%  . Coronary artery disease    Status post previous anterior wall myocardial infarction.   . Dyslipidemia    He has a hx  . GERD (gastroesophageal reflux disease)   . History of gastroesophageal reflux (GERD)   . Hypercholesterolemia    He has a hx of   . Hypertension   . Myocardial infarction ([facility]) 2008  . OSA on CPAP   . Sleep apnea    wears c-pap    Past Surgical History:  Procedure Laterality Date  . APPENDECTOMY  Age 28  . CARDIAC CATHETERIZATION     His EF is 40-45%. This has increased from 30% at the time of  last year(2008-9)  . CORONARY ARTERY BYPASS GRAFT    . KNEE SURGERY  2006  . SHOULDER SURGERY     left  . TONSILLECTOMY    . US ECHOCARDIOGRAPHY  10-25-2007   Est EF 35-40%    Current Medications: Prior to Admission medications   Medication Sig Start Date End Date Taking? Authorizing Provider  aspirin EC 81 MG tablet Take 1 tablet (81 mg total) by mouth daily. 08/17/14   Nahser, Wonda Cheng, MD  atorvastatin (LIPITOR) 80 MG tablet TAKE ONE TABLET BY MOUTH ONE TIME DAILY AT 6PM 11/13/18   Nahser, Wonda Cheng, MD  carvedilol (COREG) 12.5 MG tablet TAKE ONE TABLET BY MOUTH TWICE DAILY WITH A MEAL 11/13/18   Nahser, Wonda Cheng, MD  furosemide (LASIX) 40 MG tablet TAKE ONE TABLET BY MOUTH ONE TIME DAILY  11/13/18   Nahser, Wonda Cheng, MD  potassium chloride SA (K-DUR,KLOR-CON) 20 MEQ tablet TAKE ONE TABLET BY MOUTH ONE TIME DAILY  08/14/18   Nahser, Wonda Cheng, MD  ramipril (ALTACE) 5 MG capsule TAKE ONE  CAPSULE BY MOUTH ONE TIME DAILY  11/13/18   Nahser, Wonda Cheng, MD  spironolactone (ALDACTONE) 25 MG tablet TAKE HALF TABLET BY MOUTH DAILY  11/13/18   Nahser, Wonda Cheng, MD    Allergies:   Patient has no known allergies.   Social History   Socioeconomic History  . Marital status: Married    Spouse name: Not on file  . Number of children: Not on file  . Years of education: Not on file  . Highest education level: Not on file  Occupational History  . Occupation: high school custodian  Social Needs  . Financial resource strain: Not on file  . Food insecurity:    Worry: Not on file    Inability: Not on file  . Transportation needs:    Medical: Not on file    Non-medical: Not on file  Tobacco Use  . Smoking status: Never Smoker  . Smokeless tobacco: Never Used  Substance and Sexual Activity  . Alcohol use: No    Alcohol/week: 0.0 standard drinks    Comment: rare  . Drug use: No  . Sexual activity: Not on file  Lifestyle  .  Physical activity:    Days per week: Not on file    Minutes per session: Not on file  . Stress: Not on file  Relationships  . Social connections:    Talks on phone: Not on file    Gets together: Not on file    Attends religious service: Not on file    Active member of club or organization: Not on file    Attends meetings of clubs or organizations: Not on file    Relationship status: Not on file  Other Topics Concern  . Not on file  Social History Narrative  . Not on file     Family History:  The patient's family history includes Heart attack in his father; Kidney cancer in his mother. ***  ROS:   Please see the history of present illness.    ROS All other systems reviewed and are negative.   PHYSICAL EXAM:   VS:  There were no vitals taken for this visit.   GEN: Well nourished, well developed, in no acute distress HEENT: normal Neck: no JVD, carotid bruits, or masses Cardiac: ***RRR; no murmurs, rubs, or gallops,no edema  Respiratory:  clear  to auscultation bilaterally, normal work of breathing GI: soft, nontender, nondistended, + BS MS: no deformity or atrophy Skin: warm and dry, no rash Neuro:  Alert and Oriented x 3, Strength and sensation are intact Psych: euthymic mood, full affect  Wt Readings from Last 3 Encounters:  12/19/18 210 lb 12.8 oz (95.6 kg)  06/21/18 205 lb 12.8 oz (93.4 kg)  04/10/18 204 lb 9.6 oz (92.8 kg)      Studies/Labs Reviewed:   EKG:  EKG is ordered today.  The ekg ordered today demonstrates ***  Recent Labs: 06/21/2018: ALT 26; BUN 15; Creatinine, Ser 1.05; Potassium 4.1; Sodium 138   Lipid Panel    Component Value Date/Time   CHOL 121 06/21/2018 1021   TRIG 123 06/21/2018 1021   HDL 37 (L) 06/21/2018 1021   CHOLHDL 3.3 06/21/2018 1021   CHOLHDL 4 08/17/2014 0926   VLDL 22.2 08/17/2014 0926   LDLCALC 59 06/21/2018 1021    Additional studies/ records that were reviewed today include:   Echocardiogram:  Cardiac Catheterization:     ASSESSMENT & PLAN:    1. ***    Medication Adjustments/Labs and Tests Ordered: Current medicines are reviewed at length with the patient today.  Concerns regarding medicines are outlined above.  Medication changes, Labs and Tests ordered today are listed in the Patient Instructions below. There are no Patient Instructions on file for this visit.   Mahalia Longest [place], [place]  03/19/2019 8:28 AM    [facility] [place], [place], [place]  [address] Phone: [phone]; Fax: [phone]

## 2019-04-11 ENCOUNTER — Other Ambulatory Visit: Payer: Self-pay

## 2019-04-11 ENCOUNTER — Encounter: Payer: Self-pay | Admitting: Internal Medicine

## 2019-04-11 ENCOUNTER — Ambulatory Visit (INDEPENDENT_AMBULATORY_CARE_PROVIDER_SITE_OTHER): Payer: BC Managed Care – PPO | Admitting: Internal Medicine

## 2019-04-11 DIAGNOSIS — G4733 Obstructive sleep apnea (adult) (pediatric): Secondary | ICD-10-CM

## 2019-04-11 DIAGNOSIS — I2510 Atherosclerotic heart disease of native coronary artery without angina pectoris: Secondary | ICD-10-CM | POA: Diagnosis not present

## 2019-04-11 NOTE — Progress Notes (Signed)
HPI male never smoker followed for OSA, allergic rhinitis, complicated by HBP, CAD/ MI, CHF, GERD NPSG 02/22/11- AHI 24.2/ hr, desatutration to 86%, CPAP to 9, body weight 193 lbs  ---------------------------------------------------------------------------------- 04/10/2018- 62 yoM never smoker followed for OSA, allergic rhinitis, complicated by HBP, CAD/MI, CHF, GERD CPAP 9/[facility]  -----OSA; DME: [facility]. Pt wears CPAP nightly and DL attached. Pressure works well. Download 89% compliance AHI 3.6/hour Pressure from his headgear causes him to bite the inside of his cheek. Sleeps better with CPAP and wife tells him he is not snoring.  He feels he gets enough sleep. 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  ROS-see HPI + = positive Constitutional:   No-   weight loss, night sweats, fevers, chills, fatigue, lassitude. HEENT:   No-  headaches, difficulty swallowing, tooth/dental problems, sore throat,       No-  sneezing, itching, ear ache, + nasal congestion, post nasal drip,  CV:  No-   chest pain, orthopnea, PND, swelling in lower extremities, anasarca,                                                      dizziness, palpitations Resp: No-   shortness of breath with exertion or at rest.              No-   productive cough,  No non-productive cough,  No- coughing up of blood.              No-   change in color of mucus.  No- wheezing.   Skin: No-   rash or lesions. GI:  No-   heartburn, indigestion, abdominal pain, nausea, vomiting,  GU:  MS:  No-   joint pain or swelling.  Neuro-     nothing unusual Psych:  No- change in mood or affect. No depression or anxiety.  No memory loss.  OBJ- Physical Exam   stable baseline exam General- Alert, Oriented, Affect-appropriate, Distress- none acute, Medium build Skin- rash-none, lesions- none, excoriation- none Lymphadenopathy- none Head- atraumatic            Eyes- Gross vision intact, PERRLA, conjunctivae and secretions clear             Ears- Hearing, canals-normal            Nose- Clear, no-Septal dev, mucus, polyps, erosion, perforation             Throat- Mallampati IV , mucosa clear , drainage- none, tonsils- atrophic Neck- flexible , trachea midline, no stridor , thyroid nl, carotid no bruit Chest - symmetrical excursion , unlabored           Heart/CV- RRR , no murmur , no gallop  , no rub, nl s1 s2                           - JVD- none , edema- none, stasis changes- none, varices- none           Lung- clear to P&A, wheeze- none, cough- none , dullness-none, rub- none           Chest wall-  Abd-  Br/ Gen/ Rectal- Not done, not indicated Extrem- cyanosis- none, clubbing, none, atrophy- none, strength- nl Neuro- grossly intact to observation   04/11/2019-  Virtual Visit via Telephone Note  I connected with [name] on 04/11/19 at  9:30 AM EDT by telephone and verified that I am speaking with the correct person using two identifiers.  Location: Patient: H Provider: O   I discussed the limitations, risks, security and privacy concerns of performing an evaluation and management service by telephone and the availability of in person appointments. I also discussed with the patient that there may be a patient responsible charge related to this service. The patient expressed understanding and agreed to proceed.   History of Present Illness: 04/11/2019- 63 yoM never smoker followed for OSA, allergic rhinitis, complicated by HBP, CAD/MI, sCHF, GERD CPAP 9/[facility]   No download available -----OSA on CPAP, DME: [facility], no complaints He feels he is doing quite well and sleeps better with CPAP. Download card not available to read at this televisit.  He denies new cardiac issues and is followed by cardiology  Observations/Objective:   Assessment and Plan: OSA- good compliance and control. Continue CPAP 9 CAD- stable, managed by cardiology  Follow Up Instructions: 1 year   I discussed the assessment and  treatment plan with the patient. The patient was provided an opportunity to ask questions and all were answered. The patient agreed with the plan and demonstrated an understanding of the instructions.   The patient was advised to call back or seek an in-person evaluation if the symptoms worsen or if the condition fails to improve as anticipated.  I provided 18 minutes of non-face-to-face time during this encounter.   Baird Lyons, MD

## 2019-04-11 NOTE — Assessment & Plan Note (Signed)
He denies interval issues and continues f/u w cardiology

## 2019-04-11 NOTE — Patient Instructions (Signed)
We can continue CPAP 9, mask of choice, humidifier, supplies, Airview/ card  When your machine is 63 years old and eligible for replacement we can make arrangements with [facility] to get you a new on. I will probably recommend a change to auto 5-15 at that time.  Please call if we can help

## 2019-04-11 NOTE — Assessment & Plan Note (Signed)
He feels he benefits from CPAP with improved sleep, reports use every night, and is con=mfortable continuing CPAP 9. When eligible, we will replace his old machine with auto 5-15.

## 2019-05-10 ENCOUNTER — Other Ambulatory Visit: Payer: Self-pay | Admitting: Cardiovascular Disease

## 2019-05-13 ENCOUNTER — Other Ambulatory Visit: Payer: Self-pay | Admitting: Cardiovascular Disease

## 2019-05-13 MED ORDER — POTASSIUM CHLORIDE CRYS ER 20 MEQ PO TBCR: 20.0000 meq | EXTENDED_RELEASE_TABLET | Freq: Every day | ORAL | 2 refills | Status: DC

## 2019-07-21 ENCOUNTER — Ambulatory Visit: Payer: BC Managed Care – PPO | Admitting: Cardiovascular Disease

## 2019-07-21 ENCOUNTER — Other Ambulatory Visit: Payer: Self-pay

## 2019-07-21 ENCOUNTER — Encounter: Payer: Self-pay | Admitting: Cardiovascular Disease

## 2019-07-21 VITALS — BP 112/78 | HR 69 | Ht 66.0 in | Wt 208.0 lb

## 2019-07-21 DIAGNOSIS — E782 Mixed hyperlipidemia: Secondary | ICD-10-CM

## 2019-07-21 DIAGNOSIS — E785 Hyperlipidemia, unspecified: Secondary | ICD-10-CM

## 2019-07-21 DIAGNOSIS — I10 Essential (primary) hypertension: Secondary | ICD-10-CM | POA: Diagnosis not present

## 2019-07-21 DIAGNOSIS — I5022 Chronic systolic (congestive) heart failure: Secondary | ICD-10-CM | POA: Diagnosis not present

## 2019-07-21 DIAGNOSIS — I2510 Atherosclerotic heart disease of native coronary artery without angina pectoris: Secondary | ICD-10-CM | POA: Diagnosis not present

## 2019-07-21 NOTE — Patient Instructions (Signed)
Medication Instructions:  Your physician recommends that you continue on your current medications as directed. Please refer to the Current Medication list given to you today.  If you need a refill on your cardiac medications before your next appointment, please call your pharmacy.   Lab work: BMET, Liver and Lipid today  If you have labs (blood work) drawn today and your tests are completely normal, you will receive your results only by: Marland Kitchen MyChart Message (if you have MyChart) OR . A paper copy in the mail If you have any lab test that is abnormal or we need to change your treatment, we will call you to review the results.  Testing/Procedures: None  Follow-Up: At [facility], you and your health needs are our priority.  As part of our continuing mission to provide you with exceptional heart care, we have created designated Provider Care Teams.  These Care Teams include your primary Cardiologist (physician) and Advanced Practice Providers (APPs -  Physician Assistants and Nurse Practitioners) who all work together to provide you with the care you need, when you need it. You will need a follow up appointment in:  6 months.  Please call our office 2 months in advance to schedule this appointment.  You may see Mertie Moores, MD or one of the following Advanced Practice Providers on your designated Care Team: Richardson Dopp, PA-C [place], [place] . Daune Perch, NP  Any Other Special Instructions Will Be Listed Below (If Applicable).

## 2019-07-21 NOTE — Progress Notes (Signed)
[name] Date of Birth  [birth year] [facility] [address]    [place], [place]  [address] [phone]  Fax  [phone]   Problems: 1. CAD - s/p CABG, 2. Chronic systolic CHF 3. Hyperlipidemia   [name]  Is a 63 y.o. gentleman with a history of coronary artery disease. Status post coronary artery bypass grafting. He has a history of congestive heart failure. His most recent ejection fraction was around 40% - 45%.  He denies episodes of chest pain or shortness of breath. He has had some episodes of dizziness that have occurred in the middle of the night. They resolved spontaneously.  Oct. 24, 2014:  [name] is doing well.  He has been having episodes of sneezing when he eats as he get full . No CP , no dyspnea.  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 26, 2015:  [name] is doing well.  He still quite busy working at [employer]. He's not having episodes of chest pain or shortness of breath.  He sneezes when he eats.  He has not had any chest pain or dyspnea.   Oct. 28, 2016:  [name] is feeling well.  Following his last visit we performed an echo card gram that showed that his left ventricular systolic function had decreased slightly to 30-35%.   We added Aldactone 12. 5 mg a day. Still sneezing while he is eating/when he is full. He's not having quite as much shortness of breath. His wife states that he seems to be breathing more comfortably at night.   Jan. 10, 2017: Doing about the same. We increased Altace during the last visit.  Has not noticed any significant difference.   December 27, 2015: [name] is doing well. His echo shows improvement of his LV function .  He needs to have left knee surgery  Also needs to have a colonoscopy   He is at low risk for both procedures.   Nov. 3, 2017:  [name] is doing well today , seen with wife [name]  Echocardiogram from 12/23/2015  shows slightly improved left ventricle systolic function to an ejection fraction of 40%. He has grade 2 diastolic dysfunction.  [name] and broke his shoulder in May .  Doing well , very active, no CP or dyspnea  Has been active , works out at [employer]. [name] had gastric bypass   Feb 23, 2017:  Has had knee surgery since I last saw him. No cardiac complications  No CP or dyspnea.    Dec. 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 Is not walking as much.  He retired in June   Current Outpatient Medications on File Prior to Visit  Medication Sig Dispense Refill  . aspirin EC 81 MG tablet Take 1 tablet (81 mg total) by mouth daily.    Marland Kitchen atorvastatin (LIPITOR) 80 MG tablet TAKE ONE TABLET BY MOUTH ONE TIME DAILY AT 6PM 90 tablet 2  . carvedilol (COREG) 12.5 MG tablet TAKE ONE TABLET BY MOUTH TWICE DAILY WITH A MEAL 180 tablet 2  . furosemide (LASIX) 40 MG tablet TAKE ONE  TABLET BY MOUTH ONE TIME DAILY  90 tablet 2  . potassium chloride SA (K-DUR) 20 MEQ tablet Take 1 tablet (20 mEq total) by mouth daily. 90 tablet 2  . ramipril (ALTACE) 5 MG capsule TAKE ONE CAPSULE BY MOUTH ONE TIME DAILY  90 capsule 2  . spironolactone (ALDACTONE) 25 MG tablet TAKE HALF TABLET BY MOUTH DAILY  45 tablet 2   No current facility-administered medications on file prior to visit.     No Known Allergies  Past Medical History:  Diagnosis Date  . Allergic rhinitis   . Allergy   . Bradycardia    He has a hx of  . Congestive heart failure (HCC)    EF equals 40%  . Coronary artery disease    Status post previous anterior wall myocardial infarction.   . Dyslipidemia    He has a hx  . GERD (gastroesophageal reflux disease)   . History of gastroesophageal reflux (GERD)   .  Hypercholesterolemia    He has a hx of   . Hypertension   . Myocardial infarction ([place]) 2008  . OSA on CPAP   . Sleep apnea    wears c-pap    Past Surgical History:  Procedure Laterality Date  . APPENDECTOMY  Age 68  . CARDIAC CATHETERIZATION     His EF is 40-45%. This has increased from 30% at the time of  last year(2008-9)  . CORONARY ARTERY BYPASS GRAFT    . KNEE SURGERY  2006  . SHOULDER SURGERY     left  . TONSILLECTOMY    . US ECHOCARDIOGRAPHY  10-25-2007   Est EF 35-40%    Social History   Tobacco Use  Smoking Status Never Smoker  Smokeless Tobacco Never Used    Social History   Substance and Sexual Activity  Alcohol Use No  . Alcohol/week: 0.0 standard drinks   Comment: rare    Family History  Problem Relation Age of Onset  . Heart attack Father   . Kidney cancer Mother   . Colon cancer Neg Hx   . Esophageal cancer Neg Hx   . Pancreatic cancer Neg Hx   . Prostate cancer Neg Hx   . Rectal cancer Neg Hx   . Stomach cancer Neg Hx     Reviw of Systems:  Reviewed in the HPI.  All other systems are negative.   Physical Exam: Blood pressure 112/78, pulse 69, height 5\' 6"  (1.676 m), weight 208 lb (94.3 kg), SpO2 94 %.  GEN:  Well nourished, well developed in no acute distress HEENT: Normal NECK: No JVD; No carotid bruits LYMPHATICS: No lymphadenopathy CARDIAC: RRR  RESPIRATORY:  Clear to auscultation without rales, wheezing or rhonchi  ABDOMEN: Soft, non-tender, non-distended MUSCULOSKELETAL:  No edema; No deformity  SKIN: Warm and dry NEUROLOGIC:  Alert and oriented x 3    ECG:   July 21, 2019: Normal sinus rhythm at 69.  Assessment / Plan:   1. CAD - s/p CABG, - He is doing great.  Is not having any angina. Seems to be stable.  2. Chronic systolic CHF-   overall seems to be doing well.  He is avoid salt for the most part.  3. Hyperlipidemia -continue his atorvastatin.  Will check labs today.  I will have him see an APP in 6  months.   Mertie Moores, MD  07/21/2019 4:41 PM    [address] [place],  [place], [place]  [id number] [place] Phone: [phone]; Fax: [phone]

## 2019-07-22 LAB — BASIC METABOLIC PANEL
BUN/Creatinine Ratio: 15 (ref 10–24)
BUN: 16 mg/dL (ref 8–27)
CO2: 23 mmol/L (ref 20–29)
Calcium: 9.9 mg/dL (ref 8.6–10.2)
Chloride: 102 mmol/L (ref 96–106)
Creatinine, Ser: 1.09 mg/dL (ref 0.76–1.27)
GFR calc Af Amer: 83 mL/min/{1.73_m2} (ref >59)
GFR calc non Af Amer: 72 mL/min/{1.73_m2} (ref >59)
Glucose: 94 mg/dL (ref 65–99)
Potassium: 4.3 mmol/L (ref 3.5–5.2)
Sodium: 141 mmol/L (ref 134–144)

## 2019-07-22 LAB — LIPID PANEL
Chol/HDL Ratio: 3.3 ratio (ref 0.0–5.0)
Cholesterol, Total: 134 mg/dL (ref 100–199)
HDL: 41 mg/dL (ref >39)
LDL Chol Calc (NIH): 63 mg/dL (ref 0–99)
Triglycerides: 182 mg/dL — ABNORMAL HIGH (ref 0–149)
VLDL Cholesterol Cal: 30 mg/dL (ref 5–40)

## 2019-07-22 LAB — HEPATIC FUNCTION PANEL
ALT: 35 IU/L (ref 0–44)
AST: 25 IU/L (ref 0–40)
Albumin: 4.9 g/dL — ABNORMAL HIGH (ref 3.8–4.8)
Alkaline Phosphatase: 89 IU/L (ref 39–117)
Bilirubin Total: 1.2 mg/dL (ref 0.0–1.2)
Bilirubin, Direct: 0.29 mg/dL (ref 0.00–0.40)
Total Protein: 7.3 g/dL (ref 6.0–8.5)

## 2019-08-13 ENCOUNTER — Other Ambulatory Visit: Payer: Self-pay | Admitting: Cardiovascular Disease

## 2020-01-20 NOTE — Progress Notes (Signed)
Cardiology Office Note    Date:  01/21/2020   ID:  [name], DOB 05/29/1956, MRN [id number]  PCP:  Bernerd Limbo, MD  Cardiologist:Dr. Nahser  Chief Complaint: 6 Months follow up  History of Present Illness:   [name] is a 64 y.o. male  with history of CAD status post CABG, chronic systolic heart failure, left bundle branch block, bradycardia, hyperlipidemia and obstructive sleep apnea on CPAP presents for six-month follow-up.  Last echocardiogram March 2017 showed LV function of 40% with grade 2 diastolic dysfunction.  He was doing well when last seen by Dr. Acie Fredrickson 06/2019.  Here today for follow up.  No complaints.  He is active doing yard work and planing to walk.  He got his Moderna Covid vaccine and tolerated well.  Compliant with CPAP.  Denies chest pain, shortness of breath, orthopnea, PND, syncope, lower extremity edema or melena.  Past Medical History:  Diagnosis Date  . Allergic rhinitis   . Allergy   . Bradycardia    He has a hx of  . Congestive heart failure (HCC)    EF equals 40%  . Coronary artery disease    Status post previous anterior wall myocardial infarction.   . Dyslipidemia    He has a hx  . GERD (gastroesophageal reflux disease)   . History of gastroesophageal reflux (GERD)   . Hypercholesterolemia    He has a hx of   . Hypertension   . Myocardial infarction ([place]) 2008  . OSA on CPAP   . Sleep apnea    wears c-pap    Past Surgical History:  Procedure Laterality Date  . APPENDECTOMY  Age 2  . CARDIAC CATHETERIZATION     His EF is 40-45%. This has increased from 30% at the time of  last year(2008-9)  . CORONARY ARTERY BYPASS GRAFT    . KNEE SURGERY  2006  . SHOULDER SURGERY     left  . TONSILLECTOMY    . US ECHOCARDIOGRAPHY  10-25-2007   Est EF 35-40%    Current Medications: Prior to Admission medications   Medication Sig Start Date End Date Taking? Authorizing Provider  aspirin EC 81 MG tablet Take 1 tablet (81 mg  total) by mouth daily. 08/17/14   Nahser, Wonda Cheng, MD  atorvastatin (LIPITOR) 80 MG tablet TAKE 1 TABLET BY MOUTH DAILY AT 6PM 08/13/19   Nahser, Wonda Cheng, MD  carvedilol (COREG) 12.5 MG tablet TAKE 1 TABLET BY MOUTH TWICE A DAY WITH A MEAL  08/13/19   Nahser, Wonda Cheng, MD  furosemide (LASIX) 40 MG tablet TAKE ONE TABLET BY MOUTH ONE TIME DAILY  08/13/19   Nahser, Wonda Cheng, MD  potassium chloride SA (K-DUR) 20 MEQ tablet Take 1 tablet (20 mEq total) by mouth daily. 05/13/19   Nahser, Wonda Cheng, MD  ramipril (ALTACE) 5 MG capsule TAKE ONE CAPSULE BY MOUTH ONE TIME DAILY  08/13/19   Nahser, Wonda Cheng, MD  spironolactone (ALDACTONE) 25 MG tablet TAKE HALF TABLET BY MOUTH DAILY  08/13/19   Nahser, Wonda Cheng, MD    Allergies:   Patient has no known allergies.   Social History   Socioeconomic History  . Marital status: Married    Spouse name: Not on file  . Number of children: Not on file  . Years of education: Not on file  . Highest education level: Not on file  Occupational History  . Occupation: high school custodian  Tobacco Use  . Smoking status: Never  Smoker  . Smokeless tobacco: Never Used  Substance and Sexual Activity  . Alcohol use: No    Alcohol/week: 0.0 standard drinks    Comment: rare  . Drug use: No  . Sexual activity: Not on file  Other Topics Concern  . Not on file  Social History Narrative  . Not on file   Social Determinants of Health   Financial Resource Strain:   . Difficulty of Paying Living Expenses:   Food Insecurity:   . Worried About Charity fundraiser in the Last Year:   . Arboriculturist in the Last Year:   Transportation Needs:   . Film/video editor (Medical):   Marland Kitchen Lack of Transportation (Non-Medical):   Physical Activity:   . Days of Exercise per Week:   . Minutes of Exercise per Session:   Stress:   . Feeling of Stress :   Social Connections:   . Frequency of Communication with Friends and Family:   . Frequency of Social Gatherings with  Friends and Family:   . Attends Religious Services:   . Active Member of Clubs or Organizations:   . Attends Archivist Meetings:   Marland Kitchen Marital Status:      Family History:  The patient's family history includes Heart attack in his father; Kidney cancer in his mother.   ROS:   Please see the history of present illness.    ROS All other systems reviewed and are negative.   PHYSICAL EXAM:   VS:  BP 112/70   Pulse (!) 59   Ht 5\' 6"  (1.676 m)   Wt 208 lb 12.8 oz (94.7 kg)   SpO2 96%   BMI 33.70 kg/m    GEN: Well nourished, well developed, in no acute distress  HEENT: normal  Neck: no JVD, carotid bruits, or masses Cardiac: RRR; no murmurs, rubs, or gallops,no edema  Respiratory:  clear to auscultation bilaterally, normal work of breathing GI: soft, nontender, nondistended, + BS MS: no deformity or atrophy  Skin: warm and dry, no rash Neuro:  Alert and Oriented x 3, Strength and sensation are intact Psych: euthymic mood, full affect  Wt Readings from Last 3 Encounters:  01/21/20 208 lb 12.8 oz (94.7 kg)  07/21/19 208 lb (94.3 kg)  12/19/18 210 lb 12.8 oz (95.6 kg)      Studies/Labs Reviewed:   EKG:  EKG is not ordered today.    Recent Labs: 07/21/2019: ALT 35; BUN 16; Creatinine, Ser 1.09; Potassium 4.3; Sodium 141   Lipid Panel    Component Value Date/Time   CHOL 134 07/21/2019 0456   TRIG 182 (H) 07/21/2019 0456   HDL 41 07/21/2019 0456   CHOLHDL 3.3 07/21/2019 0456   CHOLHDL 4 08/17/2014 0926   VLDL 22.2 08/17/2014 0926   LDLCALC 63 07/21/2019 0456    Additional studies/ records that were reviewed today include:   Echocardiogram: 12/2015 - Left ventricle: The cavity size was normal. Apical septal, apical  inferior and true apex akinesis. Anterolateral hypokinesis. The  estimated ejection fraction was 40%. Features are consistent with  a pseudonormal left ventricular filling pattern, with concomitant  abnormal relaxation and increased  filling pressure (grade 2  diastolic dysfunction).  - Aortic valve: There was no stenosis. There was trivial  regurgitation.  - Mitral valve: There was trivial regurgitation.  - Left atrium: The atrium was moderately dilated.  - Right ventricle: The cavity size was normal. Systolic function  was mildly reduced.  -  Tricuspid valve: Peak RV-RA gradient (S): 25 mm Hg.  - Pulmonary arteries: PA peak pressure: 28 mm Hg (S).  - Inferior vena cava: The vessel was normal in size. The  respirophasic diameter changes were in the normal range (= 50%),  consistent with normal central venous pressure.   Impressions:   - Technically difficult study with poor acoustic windows. Use of  echo contrast was not done but may have helped. Normal LV size  with EF 40%. Wall motion abnormalities as noted above. Normal RV  size with mildly decreased systolic function. No significant  valvular abnormalities.   ASSESSMENT & PLAN:    1. CAD s/p CABG -No angina.  Continue exercise.  Continue aspirin, statin and beta-blocker.  2. Chronic systolic CHF -Euvolemic.  No CHF symptoms.  Continue Coreg, ramipril, spironolactone and Lasix.  3. HLD -LDL 63 on September 2020.  Continue Lipitor  4.  Obstructive sleep apnea -Compliant with CPAP.    Medication Adjustments/Labs and Tests Ordered: Current medicines are reviewed at length with the patient today.  Concerns regarding medicines are outlined above.  Medication changes, Labs and Tests ordered today are listed in the Patient Instructions below. Patient Instructions  Medication Instructions:  Your physician recommends that you continue on your current medications as directed. Please refer to the Current Medication list given to you today.  *If you need a refill on your cardiac medications before your next appointment, please call your pharmacy*   Lab Work: None  If you have labs (blood work) drawn today and your tests are completely  normal, you will receive your results only by: Marland Kitchen MyChart Message (if you have MyChart) OR . A paper copy in the mail If you have any lab test that is abnormal or we need to change your treatment, we will call you to review the results.   Testing/Procedures: None   Follow-Up: At [facility], you and your health needs are our priority.  As part of our continuing mission to provide you with exceptional heart care, we have created designated Provider Care Teams.  These Care Teams include your primary Cardiologist (physician) and Advanced Practice Providers (APPs -  Physician Assistants and Nurse Practitioners) who all work together to provide you with the care you need, when you need it.  We recommend signing up for the patient portal called "MyChart".  Sign up information is provided on this After Visit Summary.  MyChart is used to connect with patients for Virtual Visits (Telemedicine).  Patients are able to view lab/test results, encounter notes, upcoming appointments, etc.  Non-urgent messages can be sent to your provider as well.   To learn more about what you can do with MyChart, go to [email or web address].    Your next appointment:   12 month(s)  The format for your next appointment:   In Person  Provider:   You may see Mertie Moores, MD or one of the following Advanced Practice Providers on your designated Care Team:    Richardson Dopp, PA-C  Vin Nassawadox, PA-C  Daune Perch, NP       Signed, [place], [place]  01/21/2020 8:25 AM    [address], [place], [place]  [address] Phone: [phone]; Fax: [phone]

## 2020-01-21 ENCOUNTER — Ambulatory Visit: Payer: BC Managed Care – PPO | Admitting: Physician Assistant

## 2020-01-21 ENCOUNTER — Encounter: Payer: Self-pay | Admitting: Physician Assistant

## 2020-01-21 ENCOUNTER — Other Ambulatory Visit: Payer: Self-pay

## 2020-01-21 VITALS — BP 112/70 | HR 59 | Ht 66.0 in | Wt 208.8 lb

## 2020-01-21 DIAGNOSIS — G4733 Obstructive sleep apnea (adult) (pediatric): Secondary | ICD-10-CM

## 2020-01-21 DIAGNOSIS — E782 Mixed hyperlipidemia: Secondary | ICD-10-CM

## 2020-01-21 DIAGNOSIS — I2510 Atherosclerotic heart disease of native coronary artery without angina pectoris: Secondary | ICD-10-CM | POA: Diagnosis not present

## 2020-01-21 DIAGNOSIS — I5022 Chronic systolic (congestive) heart failure: Secondary | ICD-10-CM | POA: Diagnosis not present

## 2020-01-21 DIAGNOSIS — Z9989 Dependence on other enabling machines and devices: Secondary | ICD-10-CM

## 2020-01-21 NOTE — Patient Instructions (Signed)
Medication Instructions:  Your physician recommends that you continue on your current medications as directed. Please refer to the Current Medication list given to you today.  *If you need a refill on your cardiac medications before your next appointment, please call your pharmacy*   Lab Work: None  If you have labs (blood work) drawn today and your tests are completely normal, you will receive your results only by: Marland Kitchen MyChart Message (if you have MyChart) OR . A paper copy in the mail If you have any lab test that is abnormal or we need to change your treatment, we will call you to review the results.   Testing/Procedures: None   Follow-Up: At [facility], you and your health needs are our priority.  As part of our continuing mission to provide you with exceptional heart care, we have created designated Provider Care Teams.  These Care Teams include your primary Cardiologist (physician) and Advanced Practice Providers (APPs -  Physician Assistants and Nurse Practitioners) who all work together to provide you with the care you need, when you need it.  We recommend signing up for the patient portal called "MyChart".  Sign up information is provided on this After Visit Summary.  MyChart is used to connect with patients for Virtual Visits (Telemedicine).  Patients are able to view lab/test results, encounter notes, upcoming appointments, etc.  Non-urgent messages can be sent to your provider as well.   To learn more about what you can do with MyChart, go to [email or web address].    Your next appointment:   12 month(s)  The format for your next appointment:   In Person  Provider:   You may see Mertie Moores, MD or one of the following Advanced Practice Providers on your designated Care Team:    Richardson Dopp, PA-C  [place], [place]  Daune Perch, [place]

## 2020-02-13 ENCOUNTER — Other Ambulatory Visit: Payer: Self-pay | Admitting: Cardiovascular Disease

## 2020-04-12 ENCOUNTER — Ambulatory Visit: Payer: BC Managed Care – PPO | Admitting: Internal Medicine

## 2020-04-12 ENCOUNTER — Encounter: Payer: Self-pay | Admitting: Internal Medicine

## 2020-04-12 ENCOUNTER — Other Ambulatory Visit: Payer: Self-pay

## 2020-04-12 VITALS — BP 120/70 | HR 57 | Temp 97.3°F | Ht 66.0 in | Wt 208.0 lb

## 2020-04-12 DIAGNOSIS — G4733 Obstructive sleep apnea (adult) (pediatric): Secondary | ICD-10-CM

## 2020-04-12 DIAGNOSIS — I5022 Chronic systolic (congestive) heart failure: Secondary | ICD-10-CM

## 2020-04-12 NOTE — Progress Notes (Signed)
HPI male never smoker followed for OSA, allergic rhinitis, complicated by HBP, CAD/ MI, CHF, GERD NPSG 02/22/11- AHI 24.2/ hr, desatutration to 86%, CPAP to 9, body weight 193 lbs  ----------------------------------------------------------------------------------  04/11/2019- Virtual Visit via Telephone Note History of Present Illness: 04/11/2019- 63 yoM never smoker followed for OSA, allergic rhinitis, complicated by HBP, CAD/MI, sCHF, GERD CPAP 9/APS   No download available -----OSA on CPAP, DME: [facility]PS, no complaints He feels he is doing quite well and sleeps better with CPAP. Download card not available to read at this televisit.  He denies new cardiac issues and is followed by cardiology  Observations/Objective:  Assessment and Plan: OSA- good compliance and control. Continue CPAP 9 CAD- stable, managed by cardiology  Follow Up Instructions: 1 year   ------------------------ 04/11/2019- Virtual Visit via Telephone Note History of Present Illness: 04/11/2019- 63 yoM never smoker followed for OSA, allergic rhinitis, complicated by HBP, CAD/MI, sCHF, GERD CPAP 9/APS   No download available -----OSA on CPAP, DME: APS, no complaints He feels he is doing quite well and sleeps better with CPAP. Download card not available to read at this televisit.  He denies new cardiac issues and is followed by cardiology  Observations/Objective:  Assessment and Plan: OSA- good compliance and control. Continue CPAP 9 CAD- stable, managed by cardiology  Follow Up Instructions: 1 year    04/12/20- 04/11/2019- 63 yoM never smoker followed for OSA, Allergic Rhinitis, complicated by HTN, CAD/MI, sCHF, Gr 2 DD, Hyperlipidemia, GERD,  CPAP 9/APS/ [facility]  Download compliance 87%, AHI 5.1/  Hr Body weight today- 208 lbs Had 2 Moderna Covax Benefits from CPAP with improved sleep. APS/[facility] never followed through last year to replace old machine although he called several times.  He is interested in  changing DME.  ROS-see HPI + = positive Constitutional:   No-   weight loss, night sweats, fevers, chills, fatigue, lassitude. HEENT:   No-  headaches, difficulty swallowing, tooth/dental problems, sore throat,       No-  sneezing, itching, ear ache, + nasal congestion, post nasal drip,  CV:  No-   chest pain, orthopnea, PND, swelling in lower extremities, anasarca,                                                      dizziness, palpitations Resp: No-   shortness of breath with exertion or at rest.              No-   productive cough,  No non-productive cough,  No- coughing up of blood.              No-   change in color of mucus.  No- wheezing.   Skin: No-   rash or lesions. GI:  No-   heartburn, indigestion, abdominal pain, nausea, vomiting,  GU:  MS:  No-   joint pain or swelling.  Neuro-     nothing unusual Psych:  No- change in mood or affect. No depression or anxiety.  No memory loss.  OBJ- Physical Exam   stable baseline exam General- Alert, Oriented, Affect-appropriate, Distress- none acute, +stocky build Skin- rash-none, lesions- none, excoriation- none Lymphadenopathy- none Head- atraumatic            Eyes- Gross vision intact, PERRLA, conjunctivae and secretions clear  Ears- Hearing, canals-normal            Nose- Clear, no-Septal dev, mucus, polyps, erosion, perforation             Throat- Mallampati IV , mucosa clear , drainage- none, tonsils- atrophic Neck- flexible , trachea midline, no stridor , thyroid nl, carotid no bruit Chest - symmetrical excursion , unlabored           Heart/CV- RRR , no murmur , no gallop  , no rub, nl s1 s2                           - JVD- none , edema- none, stasis changes- none, varices- none           Lung- clear to P&A, wheeze- none, cough- none , dullness-none, rub- none           Chest wall-  Abd-  Br/ Gen/ Rectal- Not done, not indicated Extrem- cyanosis- none, clubbing, none, atrophy- none, strength- nl Neuro- grossly  intact to observation

## 2020-04-12 NOTE — Patient Instructions (Signed)
Order- please change DME (patient request) - suggest [employer] or [facility]- Replace old CPAP machine, auto 5-15, mask of choice, humidifier, supplies, AirView/ card  Please call us if we can help

## 2020-04-12 NOTE — Assessment & Plan Note (Signed)
He denies interval cardiac events. Followed by cardiology

## 2020-04-12 NOTE — Assessment & Plan Note (Signed)
Benefits from CPAP and uses routinely with good control Plan- He asks to change DME . Replace old CPAP machine, auto 5-15

## 2020-05-04 ENCOUNTER — Emergency Department (HOSPITAL_COMMUNITY)
Admission: EM | Admit: 2020-05-04 | Discharge: 2020-05-04 | Disposition: A | Discharge status: Home / Self Care | Payer: BC Managed Care – PPO | Attending: Emergency Medicine | Admitting: Emergency Medicine

## 2020-05-04 ENCOUNTER — Emergency Department (HOSPITAL_COMMUNITY): Payer: BC Managed Care – PPO

## 2020-05-04 ENCOUNTER — Other Ambulatory Visit: Payer: Self-pay

## 2020-05-04 ENCOUNTER — Encounter (HOSPITAL_COMMUNITY): Payer: Self-pay

## 2020-05-04 ENCOUNTER — Telehealth: Payer: Self-pay | Admitting: Cardiovascular Disease

## 2020-05-04 DIAGNOSIS — I2510 Atherosclerotic heart disease of native coronary artery without angina pectoris: Secondary | ICD-10-CM | POA: Diagnosis not present

## 2020-05-04 DIAGNOSIS — I110 Hypertensive heart disease with heart failure: Secondary | ICD-10-CM | POA: Insufficient documentation

## 2020-05-04 DIAGNOSIS — R42 Dizziness and giddiness: Secondary | ICD-10-CM

## 2020-05-04 DIAGNOSIS — I5022 Chronic systolic (congestive) heart failure: Secondary | ICD-10-CM | POA: Insufficient documentation

## 2020-05-04 DIAGNOSIS — R0602 Shortness of breath: Secondary | ICD-10-CM | POA: Diagnosis not present

## 2020-05-04 DIAGNOSIS — Z7982 Long term (current) use of aspirin: Secondary | ICD-10-CM | POA: Insufficient documentation

## 2020-05-04 DIAGNOSIS — Z79899 Other long term (current) drug therapy: Secondary | ICD-10-CM | POA: Diagnosis not present

## 2020-05-04 LAB — CBC
HCT: 41 % (ref 39.0–52.0)
Hemoglobin: 14.3 g/dL (ref 13.0–17.0)
MCH: 30.4 pg (ref 26.0–34.0)
MCHC: 34.9 g/dL (ref 30.0–36.0)
MCV: 87.2 fL (ref 80.0–100.0)
Platelets: 238 10*3/uL (ref 150–400)
RBC: 4.7 MIL/uL (ref 4.22–5.81)
RDW: 12.6 % (ref 11.5–15.5)
WBC: 6.2 10*3/uL (ref 4.0–10.5)
nRBC: 0 % (ref 0.0–0.2)

## 2020-05-04 LAB — BASIC METABOLIC PANEL
Anion gap: 11 (ref 5–15)
BUN: 15 mg/dL (ref 8–23)
CO2: 22 mmol/L (ref 22–32)
Calcium: 9.2 mg/dL (ref 8.9–10.3)
Chloride: 106 mmol/L (ref 98–111)
Creatinine, Ser: 0.99 mg/dL (ref 0.61–1.24)
GFR calc Af Amer: >60 mL/min (ref >60)
GFR calc non Af Amer: >60 mL/min (ref >60)
Glucose, Bld: 157 mg/dL — ABNORMAL HIGH (ref 70–99)
Potassium: 3.8 mmol/L (ref 3.5–5.1)
Sodium: 139 mmol/L (ref 135–145)

## 2020-05-04 MED ORDER — MECLIZINE HCL 25 MG PO TABS: 25.0000 mg | ORAL_TABLET | Freq: Once | ORAL | 0 refills | Status: AC | PRN

## 2020-05-04 NOTE — ED Triage Notes (Signed)
Pt reports waking up at 4am this morning with dizziness, worse with movement. Pt got up at 430 to go to the bathroom because he felt nauseated, vomited once. Denies any nausea at this time. Pt also reports DOE, no chest pain. Resp e.u at this time.

## 2020-05-04 NOTE — Telephone Encounter (Signed)
Called patient's wife (DPR) back. Informed her that patient needs to go to ED or urgent care to be evaluated. Patient's wife stated they were already heading to the ED. She stated patient woke up this morning around 4:00 am with extreme dizziness and then vomited all over the place. Patient has not had any more vomiting, but still has some dizziness. She also stated patient has also been experiencing some SOB with activity lately. Informed patient's wife that going to the ED is a good idea, and we would let Dr. Acie Fredrickson know.

## 2020-05-04 NOTE — ED Provider Notes (Signed)
[facility] EMERGENCY DEPARTMENT Provider Note   CSN: [id number] Arrival date & time: 05/04/20  4235     History Chief Complaint  Patient presents with  . Dizziness  . Shortness of Breath    [name] is a 64 y.o. male.  HPI   Patient presents to the emergency department with chief complaint of dizziness and one episode of vomiting that started this morning at 4 AM.  Patient explains while he was in bed he turned and felt like the room was spinning around him.  He got up from bed and felt nauseated and proceeded to vomit one time.  He denies seeing blood in his vomit, denies chest pain, shortness of breath, becoming diaphoretic, visual changes, slurred speech, paresthesias in any of his extremities.  Patient admitts to having dizzy spells in the past and states this feels like the one he had about 5 years ago which went away on its own.  He also admits yesterday he had a big party where he ate food that he generally does not eat and feels like he could have had a upset stomach.  Patient denies hitting his head, being on anticoagulants, recent sick contacts, pedal edema, leg pain, recent trauma or surgeries, hormone therapy, nicotine use.  Patient has significant medical history of sleep apnea, hypertension, hyperlipidemia, CAD, CHF.  Patient denies fever, chills, cough, congestion, shortness of breath, chest pain, abdominal pain, dysuria, pedal edema.  Past Medical History:  Diagnosis Date  . Allergic rhinitis   . Allergy   . Bradycardia    He has a hx of  . Congestive heart failure (HCC)    EF equals 40%  . Coronary artery disease    Status post previous anterior wall myocardial infarction.   . Dyslipidemia    He has a hx  . GERD (gastroesophageal reflux disease)   . History of gastroesophageal reflux (GERD)   . Hypercholesterolemia    He has a hx of   . Hypertension   . Myocardial infarction ([place]) 2008  . OSA on CPAP   . Sleep apnea    wears c-pap      Patient Active Problem List   Diagnosis Date Noted  . Chronic systolic CHF (congestive heart failure) ([place]) 08/17/2014  . Hyperlipidemia 11/26/2007  . Essential hypertension 11/26/2007  . CAD (coronary artery disease) 11/26/2007  . Seasonal and perennial allergic rhinitis 11/26/2007  . Obstructive sleep apnea 11/26/2007    Past Surgical History:  Procedure Laterality Date  . APPENDECTOMY  Age 89  . CARDIAC CATHETERIZATION     His EF is 40-45%. This has increased from 30% at the time of  last year(2008-9)  . CORONARY ARTERY BYPASS GRAFT    . KNEE SURGERY  2006  . SHOULDER SURGERY     left  . TONSILLECTOMY    . US ECHOCARDIOGRAPHY  10-25-2007   Est EF 35-40%       Family History  Problem Relation Age of Onset  . Heart attack Father   . Kidney cancer Mother   . Colon cancer Neg Hx   . Esophageal cancer Neg Hx   . Pancreatic cancer Neg Hx   . Prostate cancer Neg Hx   . Rectal cancer Neg Hx   . Stomach cancer Neg Hx     Social History   Tobacco Use  . Smoking status: Never Smoker  . Smokeless tobacco: Never Used  Substance Use Topics  . Alcohol use: No    Alcohol/week:  0.0 standard drinks    Comment: rare  . Drug use: No    Home Medications Prior to Admission medications   Medication Sig Start Date End Date Taking? Authorizing Provider  aspirin EC 81 MG tablet Take 1 tablet (81 mg total) by mouth daily. 08/17/14   Nahser, Wonda Cheng, MD  atorvastatin (LIPITOR) 80 MG tablet TAKE 1 TABLET BY MOUTH DAILY AT 6PM 08/13/19   Nahser, Wonda Cheng, MD  carvedilol (COREG) 12.5 MG tablet TAKE 1 TABLET BY MOUTH TWICE A DAY WITH A MEAL  08/13/19   Nahser, Wonda Cheng, MD  furosemide (LASIX) 40 MG tablet TAKE ONE TABLET BY MOUTH ONE TIME DAILY  08/13/19   Nahser, Wonda Cheng, MD  meclizine (ANTIVERT) 25 MG tablet Take 1 tablet (25 mg total) by mouth Once PRN for up to 15 doses for dizziness. 05/04/20   Marcello Fennel, PA-C  potassium chloride SA (KLOR-CON) 20 MEQ tablet TAKE ONE  TABLET BY MOUTH ONE TIME DAILY  02/13/20   Nahser, Wonda Cheng, MD  ramipril (ALTACE) 5 MG capsule TAKE ONE CAPSULE BY MOUTH ONE TIME DAILY  08/13/19   Nahser, Wonda Cheng, MD  spironolactone (ALDACTONE) 25 MG tablet TAKE HALF TABLET BY MOUTH DAILY  08/13/19   Nahser, Wonda Cheng, MD    Allergies    Patient has no known allergies.  Review of Systems   Review of Systems  Constitutional: Negative for chills and fever.  HENT: Negative for congestion, ear discharge, facial swelling, postnasal drip, sore throat, tinnitus and voice change.   Eyes: Negative for photophobia and visual disturbance.  Respiratory: Negative for cough and shortness of breath.   Cardiovascular: Negative for chest pain, palpitations and leg swelling.  Gastrointestinal: Positive for nausea and vomiting. Negative for abdominal pain and diarrhea.  Genitourinary: Negative for enuresis, flank pain and scrotal swelling.  Musculoskeletal: Negative for back pain and neck pain.  Skin: Negative for rash.  Neurological: Positive for dizziness and headaches. Negative for facial asymmetry, speech difficulty, weakness, light-headedness and numbness.  Hematological: Does not bruise/bleed easily.    Physical Exam Updated Vital Signs BP (!) 142/74   Pulse (!) 51   Temp 98.3 F (36.8 C) (Oral)   Resp 17   Ht 5\' 6"  (1.676 m)   Wt 92.5 kg   SpO2 100%   BMI 32.93 kg/m   Physical Exam Vitals and nursing note reviewed.  Constitutional:      General: He is not in acute distress.    Appearance: Normal appearance. He is not ill-appearing or diaphoretic.  HENT:     Head: Normocephalic and atraumatic.     Nose: No congestion or rhinorrhea.     Mouth/Throat:     Mouth: Mucous membranes are moist.     Pharynx: Oropharynx is clear.  Eyes:     General: No visual field deficit or scleral icterus.    Extraocular Movements: Extraocular movements intact.     Conjunctiva/sclera: Conjunctivae normal.     Pupils: Pupils are equal, round, and  reactive to light.  Cardiovascular:     Rate and Rhythm: Regular rhythm. Bradycardia present.     Pulses: Normal pulses.     Heart sounds: No murmur heard.  No friction rub. No gallop.   Pulmonary:     Effort: Pulmonary effort is normal. No respiratory distress.     Breath sounds: No wheezing, rhonchi or rales.  Abdominal:     General: There is no distension.     Palpations: Abdomen  is soft. There is no mass.     Tenderness: There is no abdominal tenderness. There is no guarding.  Musculoskeletal:        General: No swelling or tenderness.     Cervical back: No rigidity or tenderness.  Skin:    General: Skin is warm and dry.     Capillary Refill: Capillary refill takes less than 2 seconds.     Findings: No rash.  Neurological:     General: No focal deficit present.     Mental Status: He is alert and oriented to person, place, and time.     GCS: GCS eye subscore is 4. GCS verbal subscore is 5. GCS motor subscore is 6.     Cranial Nerves: Cranial nerves are intact. No cranial nerve deficit or facial asymmetry.     Sensory: Sensation is intact. No sensory deficit.     Motor: Motor function is intact. No weakness or pronator drift.     Coordination: Coordination is intact. Romberg sign negative. Finger-Nose-Finger Test and Heel to [place] Test normal.     Gait: Gait is intact. Gait normal.  Psychiatric:        Mood and Affect: Mood normal.     ED Results / Procedures / Treatments   Labs (all labs ordered are listed, but only abnormal results are displayed) Labs Reviewed  BASIC METABOLIC PANEL - Abnormal; Notable for the following components:      Result Value   Glucose, Bld 157 (*)    All other components within normal limits  CBC    EKG EKG Interpretation  Date/Time:  Tuesday May 04 2020 09:30:51 EDT Ventricular Rate:  57 PR Interval:  194 QRS Duration: 150 QT Interval:  456 QTC Calculation: 443 R Axis:   -23 Text Interpretation: Sinus bradycardia Left ventricular  hypertrophy with QRS widening and repolarization abnormality ( R in aVL , Cornell product ) Abnormal ECG widened qrs since last tracing, 08/02/07 Otherwise no significant change Confirmed by Deno Etienne [phone]) on 05/04/2020 5:39:59 PM   Radiology DG Chest 2 View  Result Date: 05/04/2020 CLINICAL DATA:  Shortness of breath EXAM: CHEST - 2 VIEW COMPARISON:  August 19, 2007 FINDINGS: Lungs are clear. Heart size and pulmonary vascularity are normal. No adenopathy. Patient is status post coronary artery bypass grafting. There is evidence of old trauma in the left lateral scapula with remodeling. IMPRESSION: No edema or airspace opacity. Heart size within normal limits. Status post coronary artery bypass grafting. Electronically Signed   By: Lowella Grip III M.D.   On: 05/04/2020 09:55    Procedures Procedures (including critical care time)  Medications Ordered in ED Medications - No data to display  ED Course  I have reviewed the triage vital signs and the nursing notes.  Pertinent labs & imaging results that were available during my care of the patient were reviewed by me and considered in my medical decision making (see chart for details).    MDM Rules/Calculators/A&P                          Due to patient complaint most concern for cardiac abnormality versus metabolic abnormality versus CVA versus PE.  Unlikely patient suffering from CVA as patient's neuro exam was benign, there is no focal deficits noted, patient coordination was fully intact, he had no gait disturbances.  Vital signs are reassuring no hypertension noted.  Unlikely patient suffering from PE as he denies pleuritic chest  pain, shortness of breath, has low risk factors does not smoke, no recent surgeries, not on hormone therapy, vital signs reassuring nontachycardic, nontachypneic, afebrile.  Unlikely patient suffering from cardiac abnormality as he denies chest pain, he did not become diaphoretic, there is no radiating  pain, no paresthesias dizziness was nonexertional physical exam did not show pedal edema and his lungs were clear bilaterally making CHF exacerbation unlikely.  Chest x-ray was performed did not show any acute abnormalities, no edema, consolidation, infiltrates, widening mediastinum.  Unlikely patient is suffering from a metabolic abnormality as BMP did not show electrolyte abnormality , no signs of AKI.  CBC was normal no signs of leukocytosis or anemia.  Patient was nontoxic-appearing, vital signs reassuring, physical exam was benign further imaging and lab work were not indicated.  Patient appears resting comfortably in bed showing no acute signs distress.  Vital signs have remained stable does not meet criteria to be admitted to the hospital.  Likely patient suffering from vertigo.  I recommend patient continue to stay hydrated, prescribed him meclizine to use as needed for dizziness and to follow-up with his cardiologist for further evaluation.  Patient was discussed with attending who agrees with assessment and plan.  Patient is given at home care as well as strict return precautions.  Patient verbalized that he understood and agrees the plan. Final Clinical Impression(s) / ED Diagnoses Final diagnoses:  Dizziness    Rx / DC Orders ED Discharge Orders         Ordered    meclizine (ANTIVERT) 25 MG tablet  IMG once as needed     Discontinue  Reprint     05/04/20 [place], [place], PA-C 05/04/20 [place], [place], DO 05/04/20 2147

## 2020-05-04 NOTE — Telephone Encounter (Signed)
STAT if patient feels like he/she is going to faint   1) Are you dizzy now? Yes not as bad as earlier   2) Do you feel faint or have you passed out? No   3) Do you have any other symptoms? Vomiting   4) Have you checked your HR and BP (record if available)? No   [name] is calling stating [name] woke up around 4 AM this morning with extreme dizziness to the point he could not even lift his head off the pillow. She states around 4:30 AM he began vomiting everywhere uncontrollably. The vomiting has stopped and no other symptoms are present, but he is still dizzy now. [name] is requesting [name] be the one to callback in regards to this. Please advise.

## 2020-05-04 NOTE — ED Notes (Signed)
Wife (christina) is outside and would like to be updated if necessary. Number in chart

## 2020-05-04 NOTE — Discharge Instructions (Signed)
You have been seen here for dizziness.  Labs and imaging are reassuring.  I prescribed you meclizine please take 1 pill as needed for dizziness.  I want you to continue to stay hydrated as dehydration can cause dizziness.  Please stand up slowly from seated positions as quick movements can also cause dizziness.  I want you to schedule a follow-up appointment with your cardiologist as you may need further evaluation and management.  I want you to come back to the emergency department if you develop pins and needle sensation in any of your extremities, slurred speech, loss of balance, change in vision, severe headache, uncontrolled nausea, vomiting, diarrhea, chest pain, shortness of breath as these symptoms require further evaluation management.

## 2020-08-10 ENCOUNTER — Other Ambulatory Visit: Payer: Self-pay | Admitting: Cardiovascular Disease

## 2020-08-30 DIAGNOSIS — R42 Dizziness and giddiness: Secondary | ICD-10-CM | POA: Insufficient documentation

## 2020-09-12 ENCOUNTER — Encounter: Payer: Self-pay | Admitting: Cardiovascular Disease

## 2020-09-12 NOTE — Progress Notes (Signed)
[name] Date of Birth  06-01-1956 [facility] [address]    [place], [place]  [address] [phone]  Fax  [phone]   Problems: 1. CAD - s/p CABG, 2. Chronic systolic CHF 3. Hyperlipidemia   [name]  Is a 64 y.o. gentleman with a history of coronary artery disease. Status post coronary artery bypass grafting. He has a history of congestive heart failure. His most recent ejection fraction was around 40% - 45%.  He denies episodes of chest pain or shortness of breath. He has had some episodes of dizziness that have occurred in the middle of the night. They resolved spontaneously.  Oct. 24, 2014:  [name] is doing well.  He has been having episodes of sneezing when he eats as he get full . No CP , no dyspnea.  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 26, 2015:  [name] is doing well.  He still quite busy working at [employer]. He's not having episodes of chest pain or shortness of breath.  He sneezes when he eats.  He has not had any chest pain or dyspnea.   Oct. 28, 2016:  [name] is feeling well.  Following his last visit we performed an echo card gram that showed that his left ventricular systolic function had decreased slightly to 30-35%.   We added Aldactone 12. 5 mg a day. Still sneezing while he is eating/when he is full. He's not having quite as much shortness of breath. His wife states that he seems to be breathing more comfortably at night.   Jan. 10, 2017: Doing about the same. We increased Altace during the last visit.  Has not noticed any significant difference.   December 27, 2015: [name] is doing well. His echo shows improvement of his LV function .  He needs to have left knee surgery  Also needs to have a colonoscopy   He is at low risk for both procedures.   Nov. 3, 2017:  [name] is doing well today , seen with wife [name]  Echocardiogram from 12/23/2015  shows slightly improved left ventricle systolic function to an ejection fraction of 40%. He has grade 2 diastolic dysfunction.  [name] and broke his shoulder in May .  Doing well , very active, no CP or dyspnea  Has been active , works out at [employer]. [name] had gastric bypass   Feb 23, 2017:  Has had knee surgery since I last saw him. No cardiac complications  No CP or dyspnea.    Dec. 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 Is not walking as much.  He retired in June   Sep 13, 2020 [name] is seen today for follow up of his CAD, CHF,  ( seen with wife, [name] )  Had some dizziness / vertigo in July. Symptoms are c/w vertigo  - room spinning with twisting his head )  Not much exercise now.    [name] has noticed some DOE  Will get an echo    Current Outpatient Medications on File Prior to Visit  Medication Sig Dispense Refill  . aspirin EC  81 MG tablet Take 1 tablet (81 mg total) by mouth daily.    Marland Kitchen atorvastatin (LIPITOR) 80 MG tablet TAKE ONE TABLET BY MOUTH ONE TIME DAILY AT 6PM 90 tablet 0  . carvedilol (COREG) 12.5 MG tablet TAKE ONE TABLET BY MOUTH TWICE DAILY WITH A MEAL 180 tablet 0  . furosemide (LASIX) 40 MG tablet TAKE ONE TABLET BY MOUTH ONE TIME DAILY 90 tablet 0  . meclizine (ANTIVERT) 25 MG tablet Take 1 tablet (25 mg total) by mouth Once PRN for up to 15 doses for dizziness. 15 tablet 0  . potassium chloride SA (KLOR-CON) 20 MEQ tablet TAKE ONE TABLET BY MOUTH ONE TIME DAILY  90 tablet 3  . ramipril (ALTACE) 5 MG capsule TAKE ONE CAPSULE BY MOUTH ONE TIME DAILY 90 capsule 0  . spironolactone (ALDACTONE) 25 MG tablet TAKE ONE-HALF TABLET BY MOUTH DAILY 45 tablet 0   No current facility-administered medications on file prior to  visit.    No Known Allergies  Past Medical History:  Diagnosis Date  . Allergic rhinitis   . Allergy   . Bradycardia    He has a hx of  . Congestive heart failure (HCC)    EF equals 40%  . Coronary artery disease    Status post previous anterior wall myocardial infarction.   . Dyslipidemia    He has a hx  . GERD (gastroesophageal reflux disease)   . History of gastroesophageal reflux (GERD)   . Hypercholesterolemia    He has a hx of   . Hypertension   . Myocardial infarction ([place]) 2008  . OSA on CPAP   . Sleep apnea    wears c-pap    Past Surgical History:  Procedure Laterality Date  . APPENDECTOMY  Age 46  . CARDIAC CATHETERIZATION     His EF is 40-45%. This has increased from 30% at the time of  last year(2008-9)  . CORONARY ARTERY BYPASS GRAFT    . KNEE SURGERY  2006  . SHOULDER SURGERY     left  . TONSILLECTOMY    . US ECHOCARDIOGRAPHY  10-25-2007   Est EF 35-40%    Social History   Tobacco Use  Smoking Status Never Smoker  Smokeless Tobacco Never Used    Social History   Substance and Sexual Activity  Alcohol Use No  . Alcohol/week: 0.0 standard drinks   Comment: rare    Family History  Problem Relation Age of Onset  . Heart attack Father   . Kidney cancer Mother   . Colon cancer Neg Hx   . Esophageal cancer Neg Hx   . Pancreatic cancer Neg Hx   . Prostate cancer Neg Hx   . Rectal cancer Neg Hx   . Stomach cancer Neg Hx     Reviw of Systems:  Reviewed in the HPI.  All other systems are negative.   Physical Exam: Blood pressure 114/72, pulse 66, height 5\' 6"  (1.676 m), weight 208 lb (94.3 kg), SpO2 94 %.  GEN:  Well nourished, well developed in no acute distress HEENT: Normal NECK: No JVD; No carotid bruits LYMPHATICS: No lymphadenopathy CARDIAC: RRR,  Sternotomy has healed with significant  keloid  RESPIRATORY:  Clear to auscultation without rales, wheezing or rhonchi  ABDOMEN: Soft, non-tender, non-distended MUSCULOSKELETAL:  No  edema; No deformity  SKIN: Warm and dry NEUROLOGIC:  Alert and oriented x 3    ECG:      Assessment / Plan:   1. CAD -  s/p CABG, - No angina   .  2. Chronic systolic CHF-    Is having worsening DOE.   This may be due to generalized deconditioning since he is not been exercising but I cannot rule out the possibility that he has worsening congestive heart failure.  His baseline EF was 40% by echo in 2017.  We will repeat his echocardiogram for further evaluation.  I advised him to work on it diet, exercise, weight loss program.   3. Hyperlipidemia - We will recheck labs in several weeks.   Mertie Moores, MD  09/13/2020 5:32 PM    [place],  [place],   [id number] Pager [phone] Phone: [phone]; Fax: [phone]

## 2020-09-13 ENCOUNTER — Other Ambulatory Visit: Payer: Self-pay

## 2020-09-13 ENCOUNTER — Ambulatory Visit: Payer: BC Managed Care – PPO | Admitting: Cardiovascular Disease

## 2020-09-13 ENCOUNTER — Encounter: Payer: Self-pay | Admitting: Cardiovascular Disease

## 2020-09-13 VITALS — BP 114/72 | HR 66 | Ht 66.0 in | Wt 208.0 lb

## 2020-09-13 DIAGNOSIS — E782 Mixed hyperlipidemia: Secondary | ICD-10-CM

## 2020-09-13 DIAGNOSIS — I5022 Chronic systolic (congestive) heart failure: Secondary | ICD-10-CM | POA: Diagnosis not present

## 2020-09-13 DIAGNOSIS — I2510 Atherosclerotic heart disease of native coronary artery without angina pectoris: Secondary | ICD-10-CM

## 2020-09-13 DIAGNOSIS — E785 Hyperlipidemia, unspecified: Secondary | ICD-10-CM

## 2020-09-13 DIAGNOSIS — R0609 Other forms of dyspnea: Secondary | ICD-10-CM

## 2020-09-13 DIAGNOSIS — I10 Essential (primary) hypertension: Secondary | ICD-10-CM

## 2020-09-13 DIAGNOSIS — R0600 Dyspnea, unspecified: Secondary | ICD-10-CM

## 2020-09-13 NOTE — Patient Instructions (Addendum)
Medication Instructions:  Your physician recommends that you continue on your current medications as directed. Please refer to the Current Medication list given to you today.  *If you need a refill on your cardiac medications before your next appointment, please call your pharmacy*   Lab Work: Your physician recommends that you return for lab work on Monday Dec. 13 You may come into our office anytime after 7:30 am You will need to FAST for this appointment - nothing to eat or drink after midnight the night before except water.  If you have labs (blood work) drawn today and your tests are completely normal, you will receive your results only by: Marland Kitchen MyChart Message (if you have MyChart) OR . A paper copy in the mail If you have any lab test that is abnormal or we need to change your treatment, we will call you to review the results.   Testing/Procedures: Your physician has requested that you have an echocardiogram. Echocardiography is a painless test that uses sound waves to create images of your heart. It provides your doctor with information about the size and shape of your heart and how well your heart's chambers and valves are working. This procedure takes approximately one hour. There are no restrictions for this procedure.     Follow-Up: At [facility], you and your health needs are our priority.  As part of our continuing mission to provide you with exceptional heart care, we have created designated Provider Care Teams.  These Care Teams include your primary Cardiologist (physician) and Advanced Practice Providers (APPs -  Physician Assistants and Nurse Practitioners) who all work together to provide you with the care you need, when you need it.  We recommend signing up for the patient portal called "MyChart".  Sign up information is provided on this After Visit Summary.  MyChart is used to connect with patients for Virtual Visits (Telemedicine).  Patients are able to view lab/test  results, encounter notes, upcoming appointments, etc.  Non-urgent messages can be sent to your provider as well.   To learn more about what you can do with MyChart, go to [email or web address].    Your next appointment:   1 year(s)  The format for your next appointment:   In Person  Provider:   You may see Mertie Moores, MD or one of the following Advanced Practice Providers on your designated Care Team:    Richardson Dopp, PA-C  [place], [place]

## 2020-10-04 ENCOUNTER — Other Ambulatory Visit: Payer: BC Managed Care – PPO

## 2020-10-11 ENCOUNTER — Ambulatory Visit (HOSPITAL_COMMUNITY): Payer: BC Managed Care – PPO | Attending: Cardiovascular Disease

## 2020-10-11 ENCOUNTER — Other Ambulatory Visit: Payer: BC Managed Care – PPO | Admitting: *Deleted

## 2020-10-11 ENCOUNTER — Other Ambulatory Visit: Payer: Self-pay

## 2020-10-11 DIAGNOSIS — E785 Hyperlipidemia, unspecified: Secondary | ICD-10-CM | POA: Insufficient documentation

## 2020-10-11 DIAGNOSIS — I2510 Atherosclerotic heart disease of native coronary artery without angina pectoris: Secondary | ICD-10-CM | POA: Diagnosis present

## 2020-10-11 DIAGNOSIS — I10 Essential (primary) hypertension: Secondary | ICD-10-CM

## 2020-10-11 DIAGNOSIS — R0609 Other forms of dyspnea: Secondary | ICD-10-CM

## 2020-10-11 DIAGNOSIS — R0600 Dyspnea, unspecified: Secondary | ICD-10-CM | POA: Insufficient documentation

## 2020-10-11 DIAGNOSIS — I5022 Chronic systolic (congestive) heart failure: Secondary | ICD-10-CM

## 2020-10-11 LAB — HEPATIC FUNCTION PANEL
ALT: 28 IU/L (ref 0–44)
AST: 22 IU/L (ref 0–40)
Albumin: 4.5 g/dL (ref 3.8–4.8)
Alkaline Phosphatase: 82 IU/L (ref 44–121)
Bilirubin Total: 1.1 mg/dL (ref 0.0–1.2)
Bilirubin, Direct: 0.26 mg/dL (ref 0.00–0.40)
Total Protein: 7.1 g/dL (ref 6.0–8.5)

## 2020-10-11 LAB — BASIC METABOLIC PANEL
BUN/Creatinine Ratio: 17 (ref 10–24)
BUN: 17 mg/dL (ref 8–27)
CO2: 24 mmol/L (ref 20–29)
Calcium: 9.2 mg/dL (ref 8.6–10.2)
Chloride: 102 mmol/L (ref 96–106)
Creatinine, Ser: 1.03 mg/dL (ref 0.76–1.27)
GFR calc Af Amer: 88 mL/min/{1.73_m2} (ref >59)
GFR calc non Af Amer: 76 mL/min/{1.73_m2} (ref >59)
Glucose: 92 mg/dL (ref 65–99)
Potassium: 4.2 mmol/L (ref 3.5–5.2)
Sodium: 139 mmol/L (ref 134–144)

## 2020-10-11 LAB — LIPID PANEL
Chol/HDL Ratio: 3.1 ratio (ref 0.0–5.0)
Cholesterol, Total: 124 mg/dL (ref 100–199)
HDL: 40 mg/dL (ref >39)
LDL Chol Calc (NIH): 66 mg/dL (ref 0–99)
Triglycerides: 94 mg/dL (ref 0–149)
VLDL Cholesterol Cal: 18 mg/dL (ref 5–40)

## 2020-10-11 LAB — ECHOCARDIOGRAM COMPLETE
Area-P 1/2: 3.77 cm2
S' Lateral: 3.9 cm

## 2020-10-11 MED ORDER — PERFLUTREN LIPID MICROSPHERE
1.0000 mL | INTRAVENOUS | Status: AC | PRN
  Administered 2020-10-11: 1 mL via INTRAVENOUS

## 2020-11-09 ENCOUNTER — Other Ambulatory Visit: Payer: Self-pay | Admitting: Physician Assistant

## 2021-02-09 ENCOUNTER — Other Ambulatory Visit: Payer: Self-pay | Admitting: Cardiovascular Disease

## 2021-04-12 NOTE — Progress Notes (Signed)
HPI male never smoker followed for OSA, allergic rhinitis, complicated by HBP, CAD/ MI, CHF, GERD NPSG 02/22/11- AHI 24.2/ hr, desatutration to 86%, CPAP to 9, body weight 193 lbs  ----------------------------------------------------------------------------------  04/12/20- 04/11/2019- 63 yoM never smoker followed for OSA, Allergic Rhinitis, complicated by HTN, CAD/MI, sCHF, Gr 2 DD, Hyperlipidemia, GERD,  CPAP 9/APS/ [facility]  Download compliance 87%, AHI 5.1/  Hr Body weight today- 208 lbs Had 2 Moderna Covax Benefits from CPAP with improved sleep. APS/[facility] never followed through last year to replace old machine although he called several times.  He is interested in changing DME.  04/13/21- 65 yoM never smoker followed for OSA, Allergic Rhinitis, complicated by HTN, CAD/MI/ CABG, sCHF, Gr 2 DD, Hyperlipidemia, GERD,  CPAP auto 5-15/ [facility] Download-  Body weight today-212 lbs Covid vax-3 Moderna   Asks about second booster- discussed. -----States wearing CPAP 6-8 hrs. Each night.Fitting and pr. Good Very pleased with his replacement for old CPAP machine, change to autopap, change to [facility], and larger mask. Wife confirms he is not snoring at all now. [facility] hasn't installed AirView- will request. No problems with heart recently. He and wife would like to change primary care into [facility] system. He has been having vertigo, esp when lying down. Not evident that it is related to CPAP. He requests referral to ENT. CXR 05/04/21- IMPRESSION: No edema or airspace opacity. Heart size within normal limits. Status post coronary artery bypass grafting.  ROS-see HPI + = positive Constitutional:   No-   weight loss, night sweats, fevers, chills, fatigue, lassitude. HEENT:   No-  headaches, difficulty swallowing, tooth/dental problems, sore throat,       No-  sneezing, itching, ear ache, + nasal congestion, post nasal drip,  CV:  No-   chest pain, orthopnea, PND, swelling in lower extremities, anasarca,             dizziness, palpitations Resp: No-   shortness of breath with exertion or at rest.              No-   productive cough,  No non-productive cough,  No- coughing up of blood.              No-   change in color of mucus.  No- wheezing.   Skin: No-   rash or lesions. GI:  No-   heartburn, indigestion, abdominal pain, nausea, vomiting,  GU:  MS:  No-   joint pain or swelling.  Neuro-     +vertigo Psych:  No- change in mood or affect. No depression or anxiety.  No memory loss.  OBJ- Physical Exam   stable baseline exam General- Alert, Oriented, Affect-appropriate, Distress- none acute, +stocky build Skin- rash-none, lesions- none, excoriation- none Lymphadenopathy- none Head- atraumatic            Eyes- Gross vision intact, PERRLA, conjunctivae and secretions clear            Ears- Hearing, canals-normal            Nose- Clear, no-Septal dev, mucus, polyps, erosion, perforation             Throat- Mallampati IV , mucosa clear , drainage- none, tonsils- atrophic Neck- flexible , trachea midline, no stridor , thyroid nl, carotid no bruit Chest - symmetrical excursion , unlabored           Heart/CV- RRR , no murmur , no gallop  , no rub, nl s1 s2                           -  JVD- none , edema- none, stasis changes- none, varices- none           Lung- clear to P&A, wheeze- none, cough- none , dullness-none, rub- none           Chest wall-  Abd-  Br/ Gen/ Rectal- Not done, not indicated Extrem- cyanosis- none, clubbing, none, atrophy- none, strength- nl Neuro- grossly intact to observation, no nystagmus

## 2021-04-13 ENCOUNTER — Ambulatory Visit: Payer: BC Managed Care – PPO | Admitting: Internal Medicine

## 2021-04-13 ENCOUNTER — Encounter: Payer: Self-pay | Admitting: Internal Medicine

## 2021-04-13 ENCOUNTER — Other Ambulatory Visit: Payer: Self-pay

## 2021-04-13 VITALS — BP 120/64 | HR 80 | Temp 98.0°F | Ht 66.0 in | Wt 212.4 lb

## 2021-04-13 DIAGNOSIS — R42 Dizziness and giddiness: Secondary | ICD-10-CM

## 2021-04-13 DIAGNOSIS — I10 Essential (primary) hypertension: Secondary | ICD-10-CM

## 2021-04-13 DIAGNOSIS — G4733 Obstructive sleep apnea (adult) (pediatric): Secondary | ICD-10-CM | POA: Diagnosis not present

## 2021-04-13 NOTE — Assessment & Plan Note (Signed)
Recent onset. Wife asks referral to ENT. Consider possibility that CPAP pressure is somehow involved. Plan- referral to ENT

## 2021-04-13 NOTE — Patient Instructions (Addendum)
Order- [name]- continue autopap 5-15, mask of choice, humidifier, supplies,  Please add AirView/ card  Please call if we can help  Order- referral to Dr Skotnicki/ ENT     dx Vertigo  Order- [facility]- patient would like to establish with a [facility] PCP for primary care

## 2021-04-13 NOTE — Assessment & Plan Note (Signed)
Benefits from CPAP. Doing very well now with replacement machine, Plan- continue auto 5-15

## 2021-04-14 ENCOUNTER — Ambulatory Visit: Payer: BC Managed Care – PPO | Admitting: Internal Medicine

## 2021-05-11 DIAGNOSIS — H9042 Sensorineural hearing loss, unilateral, left ear, with unrestricted hearing on the contralateral side: Secondary | ICD-10-CM | POA: Insufficient documentation

## 2021-08-30 ENCOUNTER — Ambulatory Visit: Payer: Medicare PPO | Admitting: Emergency Medicine

## 2021-09-13 ENCOUNTER — Ambulatory Visit: Payer: Medicare PPO | Admitting: Cardiovascular Disease

## 2021-09-13 ENCOUNTER — Encounter: Payer: Self-pay | Admitting: *Deleted

## 2021-09-13 ENCOUNTER — Encounter: Payer: Self-pay | Admitting: Cardiovascular Disease

## 2021-09-13 ENCOUNTER — Other Ambulatory Visit: Payer: Self-pay

## 2021-09-13 VITALS — BP 124/70 | HR 61 | Ht 66.0 in | Wt 210.6 lb

## 2021-09-13 DIAGNOSIS — I2510 Atherosclerotic heart disease of native coronary artery without angina pectoris: Secondary | ICD-10-CM | POA: Diagnosis not present

## 2021-09-13 DIAGNOSIS — E782 Mixed hyperlipidemia: Secondary | ICD-10-CM

## 2021-09-13 DIAGNOSIS — I5022 Chronic systolic (congestive) heart failure: Secondary | ICD-10-CM | POA: Diagnosis not present

## 2021-09-13 LAB — BASIC METABOLIC PANEL
BUN/Creatinine Ratio: 13 (ref 10–24)
BUN: 15 mg/dL (ref 8–27)
CO2: 24 mmol/L (ref 20–29)
Calcium: 9.1 mg/dL (ref 8.6–10.2)
Chloride: 101 mmol/L (ref 96–106)
Creatinine, Ser: 1.12 mg/dL (ref 0.76–1.27)
Glucose: 97 mg/dL (ref 70–99)
Potassium: 4.2 mmol/L (ref 3.5–5.2)
Sodium: 140 mmol/L (ref 134–144)
eGFR: 73 mL/min/{1.73_m2} (ref >59)

## 2021-09-13 LAB — LIPID PANEL
Chol/HDL Ratio: 3.5 ratio (ref 0.0–5.0)
Cholesterol, Total: 137 mg/dL (ref 100–199)
HDL: 39 mg/dL — ABNORMAL LOW (ref >39)
LDL Chol Calc (NIH): 79 mg/dL (ref 0–99)
Triglycerides: 105 mg/dL (ref 0–149)
VLDL Cholesterol Cal: 19 mg/dL (ref 5–40)

## 2021-09-13 LAB — ALT: ALT: 33 IU/L (ref 0–44)

## 2021-09-13 NOTE — Patient Instructions (Signed)
Medication Instructions:  Your physician recommends that you continue on your current medications as directed. Please refer to the Current Medication list given to you today.  *If you need a refill on your cardiac medications before your next appointment, please call your pharmacy*   Lab Work: Lipid, ALT and BMET today  If you have labs (blood work) drawn today and your tests are completely normal, you will receive your results only by: [place] (if you have MyChart) OR A paper copy in the mail If you have any lab test that is abnormal or we need to change your treatment, we will call you to review the results.   Testing/Procedures: Your physician has requested that you have a lexiscan myoview. For further information please visit [email or web address]. Please follow instruction sheet, as given.   Follow-Up: At [facility], you and your health needs are our priority.  As part of our continuing mission to provide you with exceptional heart care, we have created designated Provider Care Teams.  These Care Teams include your primary Cardiologist (physician) and Advanced Practice Providers (APPs -  Physician Assistants and Nurse Practitioners) who all work together to provide you with the care you need, when you need it.  We recommend signing up for the patient portal called "MyChart".  Sign up information is provided on this After Visit Summary.  MyChart is used to connect with patients for Virtual Visits (Telemedicine).  Patients are able to view lab/test results, encounter notes, upcoming appointments, etc.  Non-urgent messages can be sent to your provider as well.   To learn more about what you can do with MyChart, go to [email or web address].    Your next appointment:   1 year(s)  The format for your next appointment:   In Person  Provider:   Mertie Moores, MD     Other Instructions

## 2021-09-13 NOTE — Progress Notes (Signed)
[name] Date of Birth  07/20/1956 [facility] [address]    [place], [place]  [address] [phone]  Fax  [phone]   Problems: 1. CAD - s/p CABG, 2. Chronic systolic CHF 3. Hyperlipidemia   Mr.Lucus  Is a 65 y.o. gentleman with a history of coronary artery disease. Status post coronary artery bypass grafting. He has a history of congestive heart failure. His most recent ejection fraction was around 40% - 45%.  He denies episodes of chest pain or shortness of breath. He has had some episodes of dizziness that have occurred in the middle of the night. They resolved spontaneously.  Oct. 24, 2014:  [name] is doing well.  He has been having episodes of sneezing when he eats as he get full . No CP , no dyspnea.  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 26, 2015:  [name] is doing well.  He still quite busy working at [employer]. He's not having episodes of chest pain or shortness of breath.  He sneezes when he eats.  He has not had any chest pain or dyspnea.   Oct. 28, 2016:  [name] is feeling well.  Following his last visit we performed an echo card gram that showed that his left ventricular systolic function had decreased slightly to 30-35%.   We added Aldactone 12. 5 mg a day. Still sneezing while he is eating/when he is full. He's not having quite as much shortness of breath. His wife states that he seems to be breathing more comfortably at night.   Jan. 10, 2017: Doing about the same. We increased Altace during the last visit.  Has not noticed any significant difference.   December 27, 2015: [name] is doing well. His echo shows improvement of his LV function .  He needs to have left knee surgery  Also needs to have a colonoscopy   He is at low risk for both procedures.   Nov. 3, 2017:  [name] is doing well today , seen with wife [name]  Echocardiogram from 12/23/2015  shows slightly improved left ventricle systolic function to an ejection fraction of 40%. He has grade 2 diastolic dysfunction.  [name] and broke his shoulder in May .  Doing well , very active, no CP or dyspnea  Has been active , works out at [employer]. [name] had gastric bypass   Feb 23, 2017:  Has had knee surgery since I last saw him. No cardiac complications  No CP or dyspnea.    Dec. 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 Is not walking as much.  He retired in June   Sep 13, 2020 [name] is seen today for follow up of his CAD, CHF,  ( seen with wife, [name] )  Had some dizziness / vertigo in July. Symptoms are c/w vertigo  - room spinning with twisting his head )  Not much exercise now.    [name] has noticed some DOE  Will get an echo   Nov. 22, 2022 [name] is seen for follow up of his CHF and CAD  Seen with wife  [name]  No CP or dyspnea.  Echo from 12/21 shows EF 45-50% Is active.  Not as much exercise as he should   Current Outpatient Medications on File Prior to Visit  Medication Sig Dispense Refill   aspirin EC 81 MG tablet Take 1 tablet (81 mg total) by mouth daily.     atorvastatin (LIPITOR) 80 MG tablet TAKE ONE TABLET BY MOUTH DAILY IN THE EVENING AT 6PM 90 tablet 3   carvedilol (COREG) 12.5 MG tablet TAKE ONE TABLET BY MOUTH TWICE DAILY WITH A  MEAL 180 tablet 3   furosemide (LASIX) 40 MG tablet TAKE ONE TABLET BY MOUTH ONE TIME DAILY 90 tablet 3   meclizine (ANTIVERT) 25 MG tablet Take 1 tablet (25 mg total) by mouth Once PRN for up to 15 doses for dizziness. 15 tablet 0   potassium chloride SA (KLOR-CON) 20 MEQ tablet TAKE ONE TABLET BY MOUTH ONE TIME DAILY 90 tablet 3   ramipril (ALTACE) 5 MG capsule TAKE ONE CAPSULE BY  MOUTH ONE TIME DAILY 90 capsule 3   spironolactone (ALDACTONE) 25 MG tablet TAKE HALF TABLET BY MOUTH DAILY 45 tablet 3   No current facility-administered medications on file prior to visit.    No Known Allergies  Past Medical History:  Diagnosis Date   Allergic rhinitis    Allergy    Bradycardia    He has a hx of   Congestive heart failure (HCC)    EF equals 40%   Coronary artery disease    Status post previous anterior wall myocardial infarction.    Dyslipidemia    He has a hx   GERD (gastroesophageal reflux disease)    History of gastroesophageal reflux (GERD)    Hypercholesterolemia    He has a hx of    Hypertension    Myocardial infarction ([place]) 2008   OSA on CPAP    Sleep apnea    wears c-pap    Past Surgical History:  Procedure Laterality Date   APPENDECTOMY  Age 81   CARDIAC CATHETERIZATION     His EF is 40-45%. This has increased from 30% at the time of  last year(2008-9)   CORONARY ARTERY BYPASS GRAFT     KNEE SURGERY  2006   SHOULDER SURGERY     left   TONSILLECTOMY     US ECHOCARDIOGRAPHY  10-25-2007   Est EF 35-40%    Social History   Tobacco Use  Smoking Status Never  Smokeless Tobacco Never    Social History   Substance and Sexual Activity  Alcohol Use No   Alcohol/week: 0.0 standard drinks   Comment: rare    Family History  Problem Relation Age of Onset   Heart attack Father    Kidney cancer Mother    Colon cancer Neg Hx    Esophageal cancer Neg Hx    Pancreatic cancer Neg Hx    Prostate cancer Neg Hx    Rectal cancer Neg Hx    Stomach cancer Neg Hx     Reviw of Systems:  Reviewed in the HPI.  All other systems are negative.   Physical Exam: Blood pressure 124/70, pulse 61, height 5\' 6"  (1.676 m), weight 210 lb 9.6 oz (95.5 kg), SpO2 97 %.  GEN:  moderately obese male  in no acute distress HEENT: Normal NECK: No JVD; No carotid bruits LYMPHATICS: No lymphadenopathy CARDIAC: RRR , no murmurs, rubs, gallops RESPIRATORY:   Clear to auscultation without rales, wheezing or rhonchi  ABDOMEN: Soft, non-tender, non-distended MUSCULOSKELETAL:  No edema; No deformity  SKIN: Warm and dry NEUROLOGIC:  Alert and oriented x 3   ECG:   September 13, 2021: Normal sinus rhythm at 61.  Left bundle branch block.  He has T wave inversions laterally which are new compared to previous EKGs.   Assessment / Plan:   1. CAD - s/p CABG, -he is not having any symptoms but it should be noted that he is not all that active.  I encouraged him to start exercising regularly.  He does have some new EKG changes which are concerning.  We will schedule him for a [place] study for further evaluation.  He had an echocardiogram in December, 2021 which revealed mildly depressed left ventricular systolic function with an EF of 45 to 50%.    2. Chronic systolic CHF-    EF was stable according to echocardiogram in December, 2021.    3. Hyperlipidemia - Continue medications.  Check lipids, ALT, basic metabolic profile today.   Mertie Moores, MD  09/13/2021 8:10 AM    [place],  [place], [place]  [address] Pager [phone] Phone: [phone]; Fax: [phone]

## 2021-09-14 ENCOUNTER — Other Ambulatory Visit: Payer: Self-pay | Admitting: Cardiovascular Disease

## 2021-09-14 DIAGNOSIS — I5043 Acute on chronic combined systolic (congestive) and diastolic (congestive) heart failure: Secondary | ICD-10-CM

## 2021-09-22 ENCOUNTER — Telehealth (HOSPITAL_COMMUNITY): Payer: Self-pay

## 2021-09-22 NOTE — Telephone Encounter (Signed)
Spoke with the patient, detailed instructions given. He stated that he would be here for his test. Asked to call back with any questions. S.[name] EMTP 

## 2021-09-27 ENCOUNTER — Ambulatory Visit (HOSPITAL_COMMUNITY): Payer: Medicare PPO | Attending: Cardiovascular Disease

## 2021-09-27 ENCOUNTER — Other Ambulatory Visit: Payer: Self-pay

## 2021-09-27 DIAGNOSIS — I5022 Chronic systolic (congestive) heart failure: Secondary | ICD-10-CM | POA: Insufficient documentation

## 2021-09-27 DIAGNOSIS — I2510 Atherosclerotic heart disease of native coronary artery without angina pectoris: Secondary | ICD-10-CM | POA: Diagnosis present

## 2021-09-27 DIAGNOSIS — E782 Mixed hyperlipidemia: Secondary | ICD-10-CM | POA: Insufficient documentation

## 2021-09-27 LAB — MYOCARDIAL PERFUSION IMAGING
LV dias vol: 182 mL (ref 62–150)
LV sys vol: 117 mL
Nuc Stress EF: 36 %
Peak HR: 81 {beats}/min
Rest HR: 56 {beats}/min
Rest Nuclear Isotope Dose: 10.6 mCi
SDS: 3
SRS: 9
SSS: 11
Stress Nuclear Isotope Dose: 30.8 mCi
TID: 1.01

## 2021-09-27 MED ORDER — TECHNETIUM TC 99M TETROFOSMIN IV KIT
10.6000 | PACK | Freq: Once | INTRAVENOUS | Status: AC | PRN
  Administered 2021-09-27: 10.6 via INTRAVENOUS
  Filled 2021-09-27: qty 11

## 2021-09-27 MED ORDER — REGADENOSON 0.4 MG/5ML IV SOLN
0.4000 mg | Freq: Once | INTRAVENOUS | Status: AC
  Administered 2021-09-27: 0.4 mg via INTRAVENOUS

## 2021-09-27 MED ORDER — TECHNETIUM TC 99M TETROFOSMIN IV KIT
30.8000 | PACK | Freq: Once | INTRAVENOUS | Status: AC | PRN
  Administered 2021-09-27: 30.8 via INTRAVENOUS
  Filled 2021-09-27: qty 31

## 2021-09-28 ENCOUNTER — Ambulatory Visit: Payer: Medicare PPO | Admitting: Emergency Medicine

## 2021-09-28 ENCOUNTER — Encounter: Payer: Self-pay | Admitting: Emergency Medicine

## 2021-09-28 VITALS — BP 124/68 | HR 70 | Temp 98.0°F | Ht 66.0 in | Wt 210.0 lb

## 2021-09-28 DIAGNOSIS — I10 Essential (primary) hypertension: Secondary | ICD-10-CM

## 2021-09-28 DIAGNOSIS — Z0000 Encounter for general adult medical examination without abnormal findings: Secondary | ICD-10-CM

## 2021-09-28 DIAGNOSIS — G4733 Obstructive sleep apnea (adult) (pediatric): Secondary | ICD-10-CM

## 2021-09-28 DIAGNOSIS — Z7689 Persons encountering health services in other specified circumstances: Secondary | ICD-10-CM | POA: Diagnosis not present

## 2021-09-28 DIAGNOSIS — I2510 Atherosclerotic heart disease of native coronary artery without angina pectoris: Secondary | ICD-10-CM | POA: Diagnosis not present

## 2021-09-28 DIAGNOSIS — I5022 Chronic systolic (congestive) heart failure: Secondary | ICD-10-CM

## 2021-09-28 DIAGNOSIS — Z951 Presence of aortocoronary bypass graft: Secondary | ICD-10-CM

## 2021-09-28 NOTE — Progress Notes (Signed)
[name] 65 y.o.   Chief Complaint  Patient presents with   New Patient (Initial Visit)    Physical     HISTORY OF PRESENT ILLNESS: This is a 65 y.o. male first visit to this office here to establish care with me.  Requesting annual physical. Patient has the following chronic medical problems: 1.  Coronary artery disease status post MI in 2008 status post CABG.  Stable.  Sees cardiologist on a regular basis. Had perfusion scan done yesterday.  Results reviewed with patient. 2.  Congestive heart failure with reduced ejection fraction 3.  Hypertension 4.  Dyslipidemia 5.  Obstructive sleep apnea on CPAP therapy Had episode of vertigo.  Was seen by ENT doctor.  Much better today. Doing well.  Has no complaints or medical concerns today.  HPI   Prior to Admission medications   Medication Sig Start Date End Date Taking? Authorizing Provider  aspirin EC 81 MG tablet Take 1 tablet (81 mg total) by mouth daily. 08/17/14  Yes Nahser, Wonda Cheng, MD  atorvastatin (LIPITOR) 80 MG tablet TAKE ONE TABLET BY MOUTH DAILY IN THE EVENING AT 6PM 11/09/20  Yes Bhagat, Bhavinkumar, PA  carvedilol (COREG) 12.5 MG tablet TAKE ONE TABLET BY MOUTH TWICE DAILY WITH A  MEAL 11/09/20  Yes Bhagat, Bhavinkumar, PA  furosemide (LASIX) 40 MG tablet TAKE ONE TABLET BY MOUTH ONE TIME DAILY 11/09/20  Yes Bhagat, Bhavinkumar, PA  meclizine (ANTIVERT) 25 MG tablet Take 1 tablet (25 mg total) by mouth Once PRN for up to 15 doses for dizziness. 05/04/20  Yes Marcello Fennel, PA-C  Multiple Vitamin (MULTIVITAMIN WITH MINERALS) TABS tablet Take 1 tablet by mouth daily.   Yes [provider]  potassium chloride SA (KLOR-CON) 20 MEQ tablet TAKE ONE TABLET BY MOUTH ONE TIME DAILY 02/09/21  Yes Nahser, Wonda Cheng, MD  ramipril (ALTACE) 5 MG capsule TAKE ONE CAPSULE BY MOUTH ONE TIME DAILY 11/09/20  Yes Bhagat, [place], PA  spironolactone (ALDACTONE) 25 MG tablet TAKE HALF TABLET BY MOUTH DAILY 11/09/20  Yes  Bhagat, [place], PA    No Known Allergies  Patient Active Problem List   Diagnosis Date Noted   Sensorineural hearing loss (SNHL) of left ear with unrestricted hearing of right ear 05/11/2021   Vertigo 04/13/2021   Annual physical exam 32/95/1884   Chronic systolic CHF (congestive heart failure) ([place]) 08/17/2014   ED (erectile dysfunction) of organic origin 10/21/2013   S/P CABG x 3 10/21/2013   Hyperlipidemia 11/26/2007   Essential hypertension 11/26/2007   Coronary atherosclerosis 11/26/2007   Seasonal and perennial allergic rhinitis 11/26/2007   Obstructive sleep apnea 11/26/2007    Past Medical History:  Diagnosis Date   Allergic rhinitis    Allergy    Bradycardia    He has a hx of   Congestive heart failure (HCC)    EF equals 40%   Coronary artery disease    Status post previous anterior wall myocardial infarction.    Dyslipidemia    He has a hx   GERD (gastroesophageal reflux disease)    History of gastroesophageal reflux (GERD)    Hypercholesterolemia    He has a hx of    Hypertension    Myocardial infarction ([place]) 2008   OSA on CPAP    Sleep apnea    wears c-pap    Past Surgical History:  Procedure Laterality Date   APPENDECTOMY  Age 38   CARDIAC CATHETERIZATION     His EF is 40-45%. This  has increased from 30% at the time of  last year(2008-9)   CORONARY ARTERY BYPASS GRAFT     KNEE SURGERY  2006   SHOULDER SURGERY     left   TONSILLECTOMY     US ECHOCARDIOGRAPHY  10-25-2007   Est EF 35-40%    Social History   Socioeconomic History   Marital status: Married    Spouse name: Not on file   Number of children: Not on file   Years of education: Not on file   Highest education level: Not on file  Occupational History   Occupation: high school custodian  Tobacco Use   Smoking status: Never   Smokeless tobacco: Never  Substance and Sexual Activity   Alcohol use: No    Alcohol/week: 0.0 standard drinks    Comment: rare   Drug use: No    Sexual activity: Not on file  Other Topics Concern   Not on file  Social History Narrative   Not on file   Social Determinants of Health   Financial Resource Strain: Not on file  Food Insecurity: Not on file  Transportation Needs: Not on file  Physical Activity: Not on file  Stress: Not on file  Social Connections: Not on file  Intimate Partner Violence: Not on file    Family History  Problem Relation Age of Onset   Heart attack Father    Kidney cancer Mother    Colon cancer Neg Hx    Esophageal cancer Neg Hx    Pancreatic cancer Neg Hx    Prostate cancer Neg Hx    Rectal cancer Neg Hx    Stomach cancer Neg Hx      Review of Systems  Constitutional: Negative.  Negative for chills and fever.  HENT: Negative.  Negative for congestion and sore throat.   Respiratory: Negative.  Negative for cough and shortness of breath.   Cardiovascular: Negative.  Negative for chest pain and palpitations.  Gastrointestinal: Negative.  Negative for abdominal pain, diarrhea, nausea and vomiting.  Genitourinary: Negative.  Negative for dysuria and hematuria.  Skin: Negative.  Negative for rash.  Neurological: Negative.  Negative for dizziness and headaches.  All other systems reviewed and are negative.  Today's Vitals   09/28/21 0822  BP: 124/68  Pulse: 70  Temp: 98 F (36.7 C)  TempSrc: Oral  SpO2: 95%  Weight: 210 lb (95.3 kg)  Height: 5\' 6"  (1.676 m)   Body mass index is 33.89 kg/m.  Physical Exam Vitals reviewed.  Constitutional:      Appearance: Normal appearance.  HENT:     Head: Normocephalic.     Right Ear: Tympanic membrane, ear canal and external ear normal.     Left Ear: Tympanic membrane, ear canal and external ear normal.     Mouth/Throat:     Mouth: Mucous membranes are moist.     Pharynx: Oropharynx is clear.  Eyes:     Extraocular Movements: Extraocular movements intact.     Conjunctiva/sclera: Conjunctivae normal.     Pupils: Pupils are equal, round,  and reactive to light.  Cardiovascular:     Rate and Rhythm: Normal rate and regular rhythm.     Pulses: Normal pulses.     Heart sounds: Normal heart sounds.  Pulmonary:     Effort: Pulmonary effort is normal.     Breath sounds: Normal breath sounds.  Abdominal:     General: Bowel sounds are normal. There is no distension.     Palpations:  Abdomen is soft. There is no mass.     Tenderness: There is no abdominal tenderness.  Musculoskeletal:        General: Normal range of motion.     Cervical back: Normal range of motion and neck supple. No tenderness.     Right lower leg: No edema.     Left lower leg: No edema.  Lymphadenopathy:     Cervical: No cervical adenopathy.  Skin:    General: Skin is warm and dry.     Capillary Refill: Capillary refill takes less than 2 seconds.  Neurological:     General: No focal deficit present.     Mental Status: He is alert and oriented to person, place, and time.  Psychiatric:        Mood and Affect: Mood normal.        Behavior: Behavior normal.     ASSESSMENT & PLAN: Problem List Items Addressed This Visit       Cardiovascular and Mediastinum   Essential hypertension   Coronary atherosclerosis   Chronic systolic CHF (congestive heart failure) (HCC)     Respiratory   Obstructive sleep apnea     Other   S/P CABG x 3   Other Visit Diagnoses     Routine general medical examination at a health care facility    -  Primary   Encounter to establish care          Modifiable risk factors discussed with patient. Anticipatory guidance according to age provided. The following topics were also discussed: Social Determinants of Health Smoking.  Non-smoker Diet and nutrition Benefits of exercise Cancer screening review Vaccinations commendations.  Needs shingles vaccines Cardiovascular assessment and review of chronic conditions Review of all medications Mental health including depression and anxiety Fall and accident  prevention  Patient Instructions  Health Maintenance, Male Adopting a healthy lifestyle and getting preventive care are important in promoting health and wellness. Ask your health care provider about: The right schedule for you to have regular tests and exams. Things you can do on your own to prevent diseases and keep yourself healthy. What should I know about diet, weight, and exercise? Eat a healthy diet  Eat a diet that includes plenty of vegetables, fruits, low-fat dairy products, and lean protein. Do not eat a lot of foods that are high in solid fats, added sugars, or sodium. Maintain a healthy weight Body mass index (BMI) is a measurement that can be used to identify possible weight problems. It estimates body fat based on height and weight. Your health care provider can help determine your BMI and help you achieve or maintain a healthy weight. Get regular exercise Get regular exercise. This is one of the most important things you can do for your health. Most adults should: Exercise for at least 150 minutes each week. The exercise should increase your heart rate and make you sweat (moderate-intensity exercise). Do strengthening exercises at least twice a week. This is in addition to the moderate-intensity exercise. Spend less time sitting. Even light physical activity can be beneficial. Watch cholesterol and blood lipids Have your blood tested for lipids and cholesterol at 65 years of age, then have this test every 5 years. You may need to have your cholesterol levels checked more often if: Your lipid or cholesterol levels are high. You are older than 65 years of age. You are at high risk for heart disease. What should I know about cancer screening? Many types of cancers can be  detected early and may often be prevented. Depending on your health history and family history, you may need to have cancer screening at various ages. This may include screening for: Colorectal  cancer. Prostate cancer. Skin cancer. Lung cancer. What should I know about heart disease, diabetes, and high blood pressure? Blood pressure and heart disease High blood pressure causes heart disease and increases the risk of stroke. This is more likely to develop in people who have high blood pressure readings or are overweight. Talk with your health care provider about your target blood pressure readings. Have your blood pressure checked: Every 3-5 years if you are 47-63 years of age. Every year if you are 3 years old or older. If you are between the ages of 40 and 80 and are a current or former smoker, ask your health care provider if you should have a one-time screening for abdominal aortic aneurysm (AAA). Diabetes Have regular diabetes screenings. This checks your fasting blood sugar level. Have the screening done: Once every three years after age 74 if you are at a normal weight and have a low risk for diabetes. More often and at a younger age if you are overweight or have a high risk for diabetes. What should I know about preventing infection? Hepatitis B If you have a higher risk for hepatitis B, you should be screened for this virus. Talk with your health care provider to find out if you are at risk for hepatitis B infection. Hepatitis C Blood testing is recommended for: Everyone born from [birth year] through 1965. Anyone with known risk factors for hepatitis C. Sexually transmitted infections (STIs) You should be screened each year for STIs, including gonorrhea and chlamydia, if: You are sexually active and are younger than 65 years of age. You are older than 65 years of age and your health care provider tells you that you are at risk for this type of infection. Your sexual activity has changed since you were last screened, and you are at increased risk for chlamydia or gonorrhea. Ask your health care provider if you are at risk. Ask your health care provider about whether you are at  high risk for HIV. Your health care provider may recommend a prescription medicine to help prevent HIV infection. If you choose to take medicine to prevent HIV, you should first get tested for HIV. You should then be tested every 3 months for as long as you are taking the medicine. Follow these instructions at home: Alcohol use Do not drink alcohol if your health care provider tells you not to drink. If you drink alcohol: Limit how much you have to 0-2 drinks a day. Know how much alcohol is in your drink. In the [place] one drink equals one 12 oz bottle of beer (355 mL), one 5 oz glass of wine (148 mL), or one 1 oz glass of hard liquor (44 mL). Lifestyle Do not use any products that contain nicotine or tobacco. These products include cigarettes, chewing tobacco, and vaping devices, such as e-cigarettes. If you need help quitting, ask your health care provider. Do not use street drugs. Do not share needles. Ask your health care provider for help if you need support or information about quitting drugs. General instructions Schedule regular health, dental, and eye exams. Stay current with your vaccines. Tell your health care provider if: You often feel depressed. You have ever been abused or do not feel safe at home. Summary Adopting a healthy lifestyle and getting preventive care are  important in promoting health and wellness. Follow your health care provider's instructions about healthy diet, exercising, and getting tested or screened for diseases. Follow your health care provider's instructions on monitoring your cholesterol and blood pressure. This information is not intended to replace advice given to you by your health care provider. Make sure you discuss any questions you have with your health care provider. Document Revised: 02/28/2021 Document Reviewed: 02/28/2021 Elsevier Patient Education  2022 [place], [place] [facility]

## 2021-09-28 NOTE — Patient Instructions (Signed)

## 2021-11-07 ENCOUNTER — Other Ambulatory Visit: Payer: Self-pay | Admitting: Physician Assistant

## 2022-02-03 ENCOUNTER — Other Ambulatory Visit: Payer: Self-pay | Admitting: Cardiovascular Disease

## 2022-04-04 ENCOUNTER — Encounter: Payer: Self-pay | Admitting: Emergency Medicine

## 2022-04-04 ENCOUNTER — Ambulatory Visit: Payer: Medicare PPO | Admitting: Emergency Medicine

## 2022-04-04 VITALS — BP 120/78 | HR 58 | Temp 98.0°F | Ht 66.0 in | Wt 209.0 lb

## 2022-04-04 DIAGNOSIS — E782 Mixed hyperlipidemia: Secondary | ICD-10-CM

## 2022-04-04 DIAGNOSIS — Z23 Encounter for immunization: Secondary | ICD-10-CM | POA: Diagnosis not present

## 2022-04-04 DIAGNOSIS — G4733 Obstructive sleep apnea (adult) (pediatric): Secondary | ICD-10-CM | POA: Diagnosis not present

## 2022-04-04 DIAGNOSIS — I10 Essential (primary) hypertension: Secondary | ICD-10-CM | POA: Diagnosis not present

## 2022-04-04 DIAGNOSIS — I2510 Atherosclerotic heart disease of native coronary artery without angina pectoris: Secondary | ICD-10-CM | POA: Diagnosis not present

## 2022-04-04 DIAGNOSIS — I5022 Chronic systolic (congestive) heart failure: Secondary | ICD-10-CM | POA: Diagnosis not present

## 2022-04-04 NOTE — Assessment & Plan Note (Signed)
Well-controlled hypertension.  Continue Altace 5 mg, and Aldactone 12.5 mg daily. BP Readings from Last 3 Encounters:  04/04/22 120/78  09/28/21 124/68  09/13/21 124/70

## 2022-04-04 NOTE — Patient Instructions (Signed)
Health Maintenance After Age 65 After age 65, you are at a higher risk for certain long-term diseases and infections as well as injuries from falls. Falls are a major cause of broken bones and head injuries in people who are older than age 65. Getting regular preventive care can help to keep you healthy and well. Preventive care includes getting regular testing and making lifestyle changes as recommended by your health care provider. Talk with your health care provider about: Which screenings and tests you should have. A screening is a test that checks for a disease when you have no symptoms. A diet and exercise plan that is right for you. What should I know about screenings and tests to prevent falls? Screening and testing are the best ways to find a health problem early. Early diagnosis and treatment give you the best chance of managing medical conditions that are common after age 65. Certain conditions and lifestyle choices may make you more likely to have a fall. Your health care provider may recommend: Regular vision checks. Poor vision and conditions such as cataracts can make you more likely to have a fall. If you wear glasses, make sure to get your prescription updated if your vision changes. Medicine review. Work with your health care provider to regularly review all of the medicines you are taking, including over-the-counter medicines. Ask your health care provider about any side effects that may make you more likely to have a fall. Tell your health care provider if any medicines that you take make you feel dizzy or sleepy. Strength and balance checks. Your health care provider may recommend certain tests to check your strength and balance while standing, walking, or changing positions. Foot health exam. Foot pain and numbness, as well as not wearing proper footwear, can make you more likely to have a fall. Screenings, including: Osteoporosis screening. Osteoporosis is a condition that causes  the bones to get weaker and break more easily. Blood pressure screening. Blood pressure changes and medicines to control blood pressure can make you feel dizzy. Depression screening. You may be more likely to have a fall if you have a fear of falling, feel depressed, or feel unable to do activities that you used to do. Alcohol use screening. Using too much alcohol can affect your balance and may make you more likely to have a fall. Follow these instructions at home: Lifestyle Do not drink alcohol if: Your health care provider tells you not to drink. If you drink alcohol: Limit how much you have to: 0-1 drink a day for women. 0-2 drinks a day for men. Know how much alcohol is in your drink. In the [place] one drink equals one 12 oz bottle of beer (355 mL), one 5 oz glass of wine (148 mL), or one 1 oz glass of hard liquor (44 mL). Do not use any products that contain nicotine or tobacco. These products include cigarettes, chewing tobacco, and vaping devices, such as e-cigarettes. If you need help quitting, ask your health care provider. Activity  Follow a regular exercise program to stay fit. This will help you maintain your balance. Ask your health care provider what types of exercise are appropriate for you. If you need a cane or walker, use it as recommended by your health care provider. Wear supportive shoes that have nonskid soles. Safety  Remove any tripping hazards, such as rugs, cords, and clutter. Install safety equipment such as grab bars in bathrooms and safety rails on stairs. Keep rooms and walkways   well-lit. General instructions Talk with your health care provider about your risks for falling. Tell your health care provider if: You fall. Be sure to tell your health care provider about all falls, even ones that seem minor. You feel dizzy, tiredness (fatigue), or off-balance. Take over-the-counter and prescription medicines only as told by your health care provider. These include  supplements. Eat a healthy diet and maintain a healthy weight. A healthy diet includes low-fat dairy products, low-fat (lean) meats, and fiber from whole grains, beans, and lots of fruits and vegetables. Stay current with your vaccines. Schedule regular health, dental, and eye exams. Summary Having a healthy lifestyle and getting preventive care can help to protect your health and wellness after age 65. Screening and testing are the best way to find a health problem early and help you avoid having a fall. Early diagnosis and treatment give you the best chance for managing medical conditions that are more common for people who are older than age 65. Falls are a major cause of broken bones and head injuries in people who are older than age 65. Take precautions to prevent a fall at home. Work with your health care provider to learn what changes you can make to improve your health and wellness and to prevent falls. This information is not intended to replace advice given to you by your health care provider. Make sure you discuss any questions you have with your health care provider. Document Revised: 02/28/2021 Document Reviewed: 02/28/2021 Elsevier Patient Education  2023 [employer]  

## 2022-04-04 NOTE — Assessment & Plan Note (Signed)
Stable.  Diet and nutrition discussed. Continue atorvastatin 80 mg daily. 

## 2022-04-04 NOTE — Progress Notes (Signed)
[name] 66 y.o.   Chief Complaint  Patient presents with   Follow-up    HISTORY OF PRESENT ILLNESS: This is a 66 y.o. male here for follow-up of multiple chronic medical problems including coronary artery disease, hypertension, congestive heart failure, dyslipidemia, and sleep apnea. Sees cardiology for follow-up visit next week. Traveling to [place] later this month. Has no complaints or medical concerns today.  HPI   Prior to Admission medications   Medication Sig Start Date End Date Taking? Authorizing Provider  aspirin EC 81 MG tablet Take 1 tablet (81 mg total) by mouth daily. 08/17/14  Yes Nahser, Wonda Cheng, MD  atorvastatin (LIPITOR) 80 MG tablet TAKE ONE TABLET BY MOUTH DAILY IN THE EVENING AT 6PM 11/08/21  Yes Nahser, Wonda Cheng, MD  carvedilol (COREG) 12.5 MG tablet TAKE ONE TABLET BY MOUTH TWICE DAILY with a meal 11/08/21  Yes Nahser, Wonda Cheng, MD  furosemide (LASIX) 40 MG tablet TAKE ONE TABLET BY MOUTH ONE TIME DAILY 11/08/21  Yes Nahser, Wonda Cheng, MD  meclizine (ANTIVERT) 25 MG tablet Take 1 tablet (25 mg total) by mouth Once PRN for up to 15 doses for dizziness. 05/04/20  Yes Marcello Fennel, PA-C  Multiple Vitamin (MULTIVITAMIN WITH MINERALS) TABS tablet Take 1 tablet by mouth daily.   Yes [provider]  potassium chloride SA (KLOR-CON M) 20 MEQ tablet TAKE ONE TABLET BY MOUTH ONE TIME DAILY 02/03/22  Yes Nahser, Wonda Cheng, MD  ramipril (ALTACE) 5 MG capsule TAKE ONE CAPSULE BY MOUTH ONE TIME DAILY 11/08/21  Yes Nahser, Wonda Cheng, MD  spironolactone (ALDACTONE) 25 MG tablet TAKE HALF TABLET BY MOUTH DAILY 11/08/21  Yes Nahser, Wonda Cheng, MD    No Known Allergies  Patient Active Problem List   Diagnosis Date Noted   Sensorineural hearing loss (SNHL) of left ear with unrestricted hearing of right ear 05/11/2021   Vertigo 71/24/5809   Chronic systolic heart failure ([place]) 08/17/2014   ED (erectile dysfunction) of organic origin 10/21/2013   S/P CABG x 3  10/21/2013   Hyperlipidemia 11/26/2007   Essential hypertension 11/26/2007   Coronary atherosclerosis 11/26/2007   Perennial allergic rhinitis with seasonal variation 11/26/2007   Obstructive sleep apnea syndrome 11/26/2007    Past Medical History:  Diagnosis Date   Allergic rhinitis    Allergy    Bradycardia    He has a hx of   Congestive heart failure (HCC)    EF equals 40%   Coronary artery disease    Status post previous anterior wall myocardial infarction.    Dyslipidemia    He has a hx   GERD (gastroesophageal reflux disease)    History of gastroesophageal reflux (GERD)    Hypercholesterolemia    He has a hx of    Hypertension    Myocardial infarction ([place]) 2008   OSA on CPAP    Sleep apnea    wears c-pap    Past Surgical History:  Procedure Laterality Date   APPENDECTOMY  Age 60   CARDIAC CATHETERIZATION     His EF is 40-45%. This has increased from 30% at the time of  last year(2008-9)   CORONARY ARTERY BYPASS GRAFT     KNEE SURGERY  2006   SHOULDER SURGERY     left   TONSILLECTOMY     US ECHOCARDIOGRAPHY  10-25-2007   Est EF 35-40%    Social History   Socioeconomic History   Marital status: Married    Spouse name: Not  on file   Number of children: Not on file   Years of education: Not on file   Highest education level: Not on file  Occupational History   Occupation: high school custodian  Tobacco Use   Smoking status: Never   Smokeless tobacco: Never  Substance and Sexual Activity   Alcohol use: No    Alcohol/week: 0.0 standard drinks of alcohol    Comment: rare   Drug use: No   Sexual activity: Not on file  Other Topics Concern   Not on file  Social History Narrative   Not on file   Social Determinants of Health   Financial Resource Strain: Not on file  Food Insecurity: Not on file  Transportation Needs: Not on file  Physical Activity: Not on file  Stress: Not on file  Social Connections: Not on file  Intimate Partner Violence: Not  on file    Family History  Problem Relation Age of Onset   Heart attack Father    Kidney cancer Mother    Colon cancer Neg Hx    Esophageal cancer Neg Hx    Pancreatic cancer Neg Hx    Prostate cancer Neg Hx    Rectal cancer Neg Hx    Stomach cancer Neg Hx      Review of Systems  Constitutional: Negative.  Negative for chills and fever.  HENT: Negative.  Negative for congestion and sore throat.   Respiratory: Negative.  Negative for cough and shortness of breath.   Cardiovascular: Negative.  Negative for chest pain and palpitations.  Gastrointestinal: Negative.  Negative for abdominal pain, diarrhea, nausea and vomiting.  Genitourinary: Negative.  Negative for dysuria and hematuria.  Skin: Negative.  Negative for rash.  Neurological:  Negative for dizziness and headaches.  All other systems reviewed and are negative.  Today's Vitals   04/04/22 0804  BP: 120/78  Pulse: (!) 58  Temp: 98 F (36.7 C)  TempSrc: Oral  SpO2: 94%  Weight: 209 lb (94.8 kg)  Height: '5\' 6"'$  (1.676 m)   Body mass index is 33.73 kg/m. Wt Readings from Last 3 Encounters:  04/04/22 209 lb (94.8 kg)  09/28/21 210 lb (95.3 kg)  09/27/21 210 lb (95.3 kg)     Physical Exam Vitals reviewed.  Constitutional:      Appearance: Normal appearance.  HENT:     Head: Normocephalic.     Mouth/Throat:     Mouth: Mucous membranes are moist.     Pharynx: Oropharynx is clear.  Eyes:     Extraocular Movements: Extraocular movements intact.     Conjunctiva/sclera: Conjunctivae normal.     Pupils: Pupils are equal, round, and reactive to light.  Cardiovascular:     Rate and Rhythm: Normal rate and regular rhythm.     Pulses: Normal pulses.     Heart sounds: Normal heart sounds.  Pulmonary:     Effort: Pulmonary effort is normal.     Breath sounds: Normal breath sounds.  Musculoskeletal:     Cervical back: No tenderness.     Right lower leg: No edema.     Left lower leg: No edema.  Lymphadenopathy:      Cervical: No cervical adenopathy.  Skin:    General: Skin is warm and dry.     Capillary Refill: Capillary refill takes less than 2 seconds.  Neurological:     General: No focal deficit present.     Mental Status: He is alert and oriented to person, place, and time.  Psychiatric:        Mood and Affect: Mood normal.        Behavior: Behavior normal.      ASSESSMENT & PLAN: Problem List Items Addressed This Visit       Cardiovascular and Mediastinum   Essential hypertension - Primary    Well-controlled hypertension.  Continue Altace 5 mg, and Aldactone 12.5 mg daily. BP Readings from Last 3 Encounters:  04/04/22 120/78  09/28/21 124/68  09/13/21 124/70         Coronary atherosclerosis    Stable.  No recent anginal episodes. Continue beta-blocker with carvedilol 12.5 mg twice a day. Continue daily baby aspirin.      Chronic systolic heart failure (HCC)    Clinically euvolemic.  No symptoms of CHF. Scheduled for cardiology follow-up next week. Wt Readings from Last 3 Encounters:  04/04/22 209 lb (94.8 kg)  09/28/21 210 lb (95.3 kg)  09/27/21 210 lb (95.3 kg)  Medications handled by his cardiology.         Respiratory   Obstructive sleep apnea syndrome    Stable and consistent with CPAP treatment.        Other   Hyperlipidemia    Stable.  Diet and nutrition discussed.  Continue atorvastatin 80 mg daily.      Other Visit Diagnoses     Need for Tdap vaccination       Relevant Orders   Tdap vaccine greater than or equal to 7yo IM      Patient Instructions  Health Maintenance After Age 53 After age 18, you are at a higher risk for certain long-term diseases and infections as well as injuries from falls. Falls are a major cause of broken bones and head injuries in people who are older than age 44. Getting regular preventive care can help to keep you healthy and well. Preventive care includes getting regular testing and making lifestyle changes as  recommended by your health care provider. Talk with your health care provider about: Which screenings and tests you should have. A screening is a test that checks for a disease when you have no symptoms. A diet and exercise plan that is right for you. What should I know about screenings and tests to prevent falls? Screening and testing are the best ways to find a health problem early. Early diagnosis and treatment give you the best chance of managing medical conditions that are common after age 22. Certain conditions and lifestyle choices may make you more likely to have a fall. Your health care provider may recommend: Regular vision checks. Poor vision and conditions such as cataracts can make you more likely to have a fall. If you wear glasses, make sure to get your prescription updated if your vision changes. Medicine review. Work with your health care provider to regularly review all of the medicines you are taking, including over-the-counter medicines. Ask your health care provider about any side effects that may make you more likely to have a fall. Tell your health care provider if any medicines that you take make you feel dizzy or sleepy. Strength and balance checks. Your health care provider may recommend certain tests to check your strength and balance while standing, walking, or changing positions. Foot health exam. Foot pain and numbness, as well as not wearing proper footwear, can make you more likely to have a fall. Screenings, including: Osteoporosis screening. Osteoporosis is a condition that causes the bones to get weaker and break more easily. Blood pressure screening.  Blood pressure changes and medicines to control blood pressure can make you feel dizzy. Depression screening. You may be more likely to have a fall if you have a fear of falling, feel depressed, or feel unable to do activities that you used to do. Alcohol use screening. Using too much alcohol can affect your balance and  may make you more likely to have a fall. Follow these instructions at home: Lifestyle Do not drink alcohol if: Your health care provider tells you not to drink. If you drink alcohol: Limit how much you have to: 0-1 drink a day for women. 0-2 drinks a day for men. Know how much alcohol is in your drink. In the [place] one drink equals one 12 oz bottle of beer (355 mL), one 5 oz glass of wine (148 mL), or one 1 oz glass of hard liquor (44 mL). Do not use any products that contain nicotine or tobacco. These products include cigarettes, chewing tobacco, and vaping devices, such as e-cigarettes. If you need help quitting, ask your health care provider. Activity  Follow a regular exercise program to stay fit. This will help you maintain your balance. Ask your health care provider what types of exercise are appropriate for you. If you need a cane or walker, use it as recommended by your health care provider. Wear supportive shoes that have nonskid soles. Safety  Remove any tripping hazards, such as rugs, cords, and clutter. Install safety equipment such as grab bars in bathrooms and safety rails on stairs. Keep rooms and walkways well-lit. General instructions Talk with your health care provider about your risks for falling. Tell your health care provider if: You fall. Be sure to tell your health care provider about all falls, even ones that seem minor. You feel dizzy, tiredness (fatigue), or off-balance. Take over-the-counter and prescription medicines only as told by your health care provider. These include supplements. Eat a healthy diet and maintain a healthy weight. A healthy diet includes low-fat dairy products, low-fat (lean) meats, and fiber from whole grains, beans, and lots of fruits and vegetables. Stay current with your vaccines. Schedule regular health, dental, and eye exams. Summary Having a healthy lifestyle and getting preventive care can help to protect your health and wellness  after age 57. Screening and testing are the best way to find a health problem early and help you avoid having a fall. Early diagnosis and treatment give you the best chance for managing medical conditions that are more common for people who are older than age 64. Falls are a major cause of broken bones and head injuries in people who are older than age 88. Take precautions to prevent a fall at home. Work with your health care provider to learn what changes you can make to improve your health and wellness and to prevent falls. This information is not intended to replace advice given to you by your health care provider. Make sure you discuss any questions you have with your health care provider. Document Revised: 02/28/2021 Document Reviewed: 02/28/2021 Elsevier Patient Education  [place], MD [facility]

## 2022-04-04 NOTE — Assessment & Plan Note (Signed)
Stable and consistent with CPAP treatment.

## 2022-04-04 NOTE — Assessment & Plan Note (Signed)
Stable.  No recent anginal episodes. Continue beta-blocker with carvedilol 12.5 mg twice a day. Continue daily baby aspirin.

## 2022-04-04 NOTE — Assessment & Plan Note (Signed)
Clinically euvolemic.  No symptoms of CHF. Scheduled for cardiology follow-up next week. Wt Readings from Last 3 Encounters:  04/04/22 209 lb (94.8 kg)  09/28/21 210 lb (95.3 kg)  09/27/21 210 lb (95.3 kg)  Medications handled by his cardiology.

## 2022-04-12 NOTE — Progress Notes (Signed)
HPI male never smoker followed for OSA, allergic rhinitis, complicated by HBP, CAD/ MI, CHF, GERD NPSG 02/22/11- AHI 24.2/ hr, desatutration to 86%, CPAP to 9, body weight 193 lbs  ----------------------------------------------------------------------------------   04/13/21- 65 yoM never smoker followed for OSA, Allergic Rhinitis, complicated by HTN, CAD/MI/ CABG, sCHF, Gr 2 DD, Hyperlipidemia, GERD,  CPAP auto 5-15/ [employer] Download-  Body weight today-212 lbs Covid vax-3 Moderna   Asks about second booster- discussed. -----States wearing CPAP 6-8 hrs. Each night.Fitting and pr. Good Very pleased with his replacement for old CPAP machine, change to autopap, change to [employer], and larger mask. Wife confirms he is not snoring at all now. [employer] hasn't installed AirView- will request. No problems with heart recently. He and wife would like to change primary care into [facility] system. He has been having vertigo, esp when lying down. Not evident that it is related to CPAP. He requests referral to ENT. CXR 05/04/20- IMPRESSION: No edema or airspace opacity. Heart size within normal limits. Status post coronary artery bypass grafting.  03/2222- 66 yoM never smoker followed for OSA, Allergic Rhinitis, complicated by HTN, CAD/MI/ CABG, sCHF, Gr 2 DD, Hyperlipidemia, GERD, Vertigo(ENT), CPAP auto 5-15/ [employer] Download-forgot his SD card Body weight today- Covid vax-3 Moderna  He reports being very comfortable and sleeping quite well with his CPAP machine.  He reports it is mechanically working fine with no concerns. Denies any problems with his breathing and denies any recent acute issues with his heart problems.  He has been evaluated recently for vertigo and I suggested he might try OTC meclizine.  ROS-see HPI + = positive Constitutional:   No-   weight loss, night sweats, fevers, chills, fatigue, lassitude. HEENT:   No-  headaches, difficulty swallowing, tooth/dental problems, sore throat,       No-   sneezing, itching, ear ache, + nasal congestion, post nasal drip,  CV:  No-   chest pain, orthopnea, PND, swelling in lower extremities, anasarca,            dizziness, palpitations Resp: No-   shortness of breath with exertion or at rest.              No-   productive cough,  No non-productive cough,  No- coughing up of blood.              No-   change in color of mucus.  No- wheezing.   Skin: No-   rash or lesions. GI:  No-   heartburn, indigestion, abdominal pain, nausea, vomiting,  GU:  MS:  No-   joint pain or swelling.  Neuro-     +vertigo Psych:  No- change in mood or affect. No depression or anxiety.  No memory loss.  OBJ- Physical Exam   stable baseline exam General- Alert, Oriented, Affect-appropriate, Distress- none acute, +stocky build Skin- rash-none, lesions- none, excoriation- none Lymphadenopathy- none Head- atraumatic            Eyes- Gross vision intact, PERRLA, conjunctivae and secretions clear            Ears- Hearing, canals-normal            Nose- Clear, no-Septal dev, mucus, polyps, erosion, perforation             Throat- Mallampati IV , mucosa clear , drainage- none, tonsils- atrophic Neck- flexible , trachea midline, no stridor , thyroid nl, carotid no bruit Chest - symmetrical excursion , unlabored  Heart/CV- RRR , no murmur , no gallop  , no rub, nl s1 s2                           - JVD- none , edema- none, stasis changes- none, varices- none           Lung- clear to P&A, wheeze- none, cough- none , dullness-none, rub- none           Chest wall-  Abd-  Br/ Gen/ Rectal- Not done, not indicated Extrem- cyanosis- none, clubbing, none, atrophy- none, strength- nl Neuro- grossly intact to observation, no nystagmus

## 2022-04-13 ENCOUNTER — Encounter: Payer: Self-pay | Admitting: Cardiovascular Disease

## 2022-04-13 ENCOUNTER — Encounter: Payer: Self-pay | Admitting: Internal Medicine

## 2022-04-13 ENCOUNTER — Ambulatory Visit: Payer: Medicare PPO | Admitting: Internal Medicine

## 2022-04-13 DIAGNOSIS — R42 Dizziness and giddiness: Secondary | ICD-10-CM

## 2022-04-13 DIAGNOSIS — I2510 Atherosclerotic heart disease of native coronary artery without angina pectoris: Secondary | ICD-10-CM

## 2022-04-13 DIAGNOSIS — G4733 Obstructive sleep apnea (adult) (pediatric): Secondary | ICD-10-CM | POA: Diagnosis not present

## 2022-04-13 NOTE — Assessment & Plan Note (Signed)
Benefits from CPAP and reports good compliance and control with good sleep quality. Plan-continue auto 5-15

## 2022-04-13 NOTE — Patient Instructions (Signed)
We can continue CPAP auto 5-15. Ok to get an adapter if needed for travel.  You might try otc meclizine to see if it helps your vertigo  (Sold for motion sickness. It's like Dramamine, but causes less drowsiness.)  Please call if we can help

## 2022-04-13 NOTE — Assessment & Plan Note (Signed)
Managed by cardiology.  He denies any recent acute changes.

## 2022-04-13 NOTE — Assessment & Plan Note (Signed)
Suggested trial of meclizine

## 2022-04-13 NOTE — Progress Notes (Unsigned)
[name] Date of Birth  05-31-1956 [facility] [address]    [place], [place]  [address] [phone]  Fax  [phone]   Problems: 1. CAD - s/p CABG, 2. Chronic systolic CHF 3. Hyperlipidemia   [name]  Is a 66 y.o. gentleman with a history of coronary artery disease. Status post coronary artery bypass grafting. He has a history of congestive heart failure. His most recent ejection fraction was around 40% - 45%.  He denies episodes of chest pain or shortness of breath. He has had some episodes of dizziness that have occurred in the middle of the night. They resolved spontaneously.  Oct. 24, 2014:  [name] is doing well.  He has been having episodes of sneezing when he eats as he get full . No CP , no dyspnea.  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 26, 2015:  [name] is doing well.  He still quite busy working at [employer]. He's not having episodes of chest pain or shortness of breath.  He sneezes when he eats.  He has not had any chest pain or dyspnea.   Oct. 28, 2016:  [name] is feeling well.  Following his last visit we performed an echo card gram that showed that his left ventricular systolic function had decreased slightly to 30-35%.   We added Aldactone 12. 5 mg a day. Still sneezing while he is eating/when he is full. He's not having quite as much shortness of breath. His wife states that he seems to be breathing more comfortably at night.   Jan. 10, 2017: Doing about the same. We increased Altace during the last visit.  Has not noticed any significant difference.   December 27, 2015: [name] is doing well. His echo shows improvement of his LV function .  He needs to have left knee surgery  Also needs to have a colonoscopy   He is at low risk for both procedures.   Nov. 3, 2017:  [name] is doing well today , seen with wife [name]  Echocardiogram from 12/23/2015  shows slightly improved left ventricle systolic function to an ejection fraction of 40%. He has grade 2 diastolic dysfunction.  [name] and broke his shoulder in May .  Doing well , very active, no CP or dyspnea  Has been active , works out at [employer]. [name] had gastric bypass   Feb 23, 2017:  Has had knee surgery since I last saw him. No cardiac complications  No CP or dyspnea.    Dec. 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 Is not walking as much.  He retired in June   Sep 13, 2020 [name] is seen today for follow up of his CAD, CHF,  ( seen with wife, [name] )  Had some dizziness / vertigo in July. Symptoms are c/w vertigo  - room spinning with twisting his head )  Not much exercise now.    [name] has noticed some DOE  Will get an echo   Nov. 22, 2022 [name] is seen for follow up of his CHF and CAD  Seen with wife  [name]  No CP or dyspnea.  Echo from 12/21 shows EF 45-50% Is active.  Not as much exercise as he should   April 14, 2022  [name] is seen today for follow up of his CAD, [name]    Current Outpatient Medications on File Prior to Visit  Medication Sig Dispense Refill   aspirin EC 81 MG tablet Take 1 tablet (81 mg total) by mouth daily.     atorvastatin (LIPITOR) 80 MG tablet TAKE ONE TABLET BY MOUTH DAILY IN THE EVENING AT 6PM 90 tablet 3   carvedilol (COREG) 12.5 MG tablet TAKE ONE TABLET BY MOUTH TWICE DAILY with a meal 180 tablet 3   furosemide (LASIX) 40 MG tablet TAKE ONE TABLET BY MOUTH ONE TIME DAILY 90 tablet 3   meclizine (ANTIVERT) 25 MG tablet Take 1 tablet (25 mg total) by mouth Once PRN for up to 15 doses for dizziness. 15 tablet 0   Multiple Vitamin (MULTIVITAMIN WITH MINERALS) TABS tablet Take 1 tablet by mouth  daily.     potassium chloride SA (KLOR-CON M) 20 MEQ tablet TAKE ONE TABLET BY MOUTH ONE TIME DAILY 90 tablet 2   ramipril (ALTACE) 5 MG capsule TAKE ONE CAPSULE BY MOUTH ONE TIME DAILY 90 capsule 3   spironolactone (ALDACTONE) 25 MG tablet TAKE HALF TABLET BY MOUTH DAILY 45 tablet 3   No current facility-administered medications on file prior to visit.    No Known Allergies  Past Medical History:  Diagnosis Date   Allergic rhinitis    Allergy    Bradycardia    He has a hx of   Congestive heart failure (HCC)    EF equals 40%   Coronary artery disease    Status post previous anterior wall myocardial infarction.    Dyslipidemia    He has a hx   GERD (gastroesophageal reflux disease)    History of gastroesophageal reflux (GERD)    Hypercholesterolemia    He has a hx of    Hypertension    Myocardial infarction ([place]) 2008   OSA on CPAP    Sleep apnea    wears c-pap    Past Surgical History:  Procedure Laterality Date   APPENDECTOMY  Age 71   CARDIAC CATHETERIZATION     His EF is 40-45%. This has increased from 30% at the time of  last year(2008-9)   CORONARY ARTERY BYPASS GRAFT     KNEE SURGERY  2006   SHOULDER SURGERY     left   TONSILLECTOMY     US ECHOCARDIOGRAPHY  10-25-2007   Est EF 35-40%    Social History   Tobacco Use  Smoking Status Never  Smokeless Tobacco Never    Social History   Substance and Sexual Activity  Alcohol Use No   Alcohol/week: 0.0 standard drinks of alcohol   Comment: rare    Family History  Problem Relation Age of Onset   Heart attack Father    Kidney cancer Mother    Colon cancer Neg Hx    Esophageal cancer Neg Hx    Pancreatic cancer Neg Hx    Prostate cancer Neg Hx    Rectal cancer Neg Hx    Stomach cancer Neg Hx     Reviw of Systems:  Reviewed in the HPI.  All other systems are negative.   Physical Exam: There were no vitals taken for this visit.  GEN:  Well nourished, well developed in no acute distress HEENT:  Normal NECK: No  JVD; No carotid bruits LYMPHATICS: No lymphadenopathy CARDIAC: RRR ***, no murmurs, rubs, gallops RESPIRATORY:  Clear to auscultation without rales, wheezing or rhonchi  ABDOMEN: Soft, non-tender, non-distended MUSCULOSKELETAL:  No edema; No deformity  SKIN: Warm and dry NEUROLOGIC:  Alert and oriented x 3    ECG:      Assessment / Plan:   1. CAD - s/p CABG, - He had an echocardiogram in December, 2021 which revealed mildly depressed left ventricular systolic function with an EF of 45 to 50%.    2. Chronic systolic CHF-      3. Hyperlipidemia -    Mertie Moores, MD  04/13/2022 9:16 PM    [address],  [place], [place]  [phone] Pager [phone] Phone: [phone]; Fax: [phone]

## 2022-04-14 ENCOUNTER — Ambulatory Visit: Payer: Medicare PPO | Admitting: Cardiovascular Disease

## 2022-04-14 ENCOUNTER — Encounter: Payer: Self-pay | Admitting: Cardiovascular Disease

## 2022-04-14 VITALS — BP 122/76 | HR 71 | Ht 66.0 in | Wt 208.4 lb

## 2022-04-14 DIAGNOSIS — I5043 Acute on chronic combined systolic (congestive) and diastolic (congestive) heart failure: Secondary | ICD-10-CM

## 2022-04-14 DIAGNOSIS — I2510 Atherosclerotic heart disease of native coronary artery without angina pectoris: Secondary | ICD-10-CM | POA: Diagnosis not present

## 2022-04-14 LAB — BASIC METABOLIC PANEL
BUN/Creatinine Ratio: 17 (ref 10–24)
BUN: 17 mg/dL (ref 8–27)
CO2: 22 mmol/L (ref 20–29)
Calcium: 9.6 mg/dL (ref 8.6–10.2)
Chloride: 102 mmol/L (ref 96–106)
Creatinine, Ser: 0.99 mg/dL (ref 0.76–1.27)
Glucose: 99 mg/dL (ref 70–99)
Potassium: 4.2 mmol/L (ref 3.5–5.2)
Sodium: 139 mmol/L (ref 134–144)
eGFR: 84 mL/min/{1.73_m2} (ref >59)

## 2022-04-14 LAB — LIPID PANEL
Chol/HDL Ratio: 3.6 ratio (ref 0.0–5.0)
Cholesterol, Total: 134 mg/dL (ref 100–199)
HDL: 37 mg/dL — ABNORMAL LOW (ref >39)
LDL Chol Calc (NIH): 71 mg/dL (ref 0–99)
Triglycerides: 150 mg/dL — ABNORMAL HIGH (ref 0–149)
VLDL Cholesterol Cal: 26 mg/dL (ref 5–40)

## 2022-04-14 LAB — ALT: ALT: 30 IU/L (ref 0–44)

## 2022-10-03 ENCOUNTER — Ambulatory Visit (INDEPENDENT_AMBULATORY_CARE_PROVIDER_SITE_OTHER): Payer: Medicare PPO | Admitting: *Deleted

## 2022-10-03 DIAGNOSIS — Z0000 Encounter for general adult medical examination without abnormal findings: Secondary | ICD-10-CM

## 2022-10-03 NOTE — Patient Instructions (Signed)
Health Maintenance, Male Adopting a healthy lifestyle and getting preventive care are important in promoting health and wellness. Ask your health care provider about: The right schedule for you to have regular tests and exams. Things you can do on your own to prevent diseases and keep yourself healthy. What should I know about diet, weight, and exercise? Eat a healthy diet  Eat a diet that includes plenty of vegetables, fruits, low-fat dairy products, and lean protein. Do not eat a lot of foods that are high in solid fats, added sugars, or sodium. Maintain a healthy weight Body mass index (BMI) is a measurement that can be used to identify possible weight problems. It estimates body fat based on height and weight. Your health care provider can help determine your BMI and help you achieve or maintain a healthy weight. Get regular exercise Get regular exercise. This is one of the most important things you can do for your health. Most adults should: Exercise for at least 150 minutes each week. The exercise should increase your heart rate and make you sweat (moderate-intensity exercise). Do strengthening exercises at least twice a week. This is in addition to the moderate-intensity exercise. Spend less time sitting. Even light physical activity can be beneficial. Watch cholesterol and blood lipids Have your blood tested for lipids and cholesterol at 66 years of age, then have this test every 5 years. You may need to have your cholesterol levels checked more often if: Your lipid or cholesterol levels are high. You are older than 66 years of age. You are at high risk for heart disease. What should I know about cancer screening? Many types of cancers can be detected early and may often be prevented. Depending on your health history and family history, you may need to have cancer screening at various ages. This may include screening for: Colorectal cancer. Prostate cancer. Skin cancer. Lung  cancer. What should I know about heart disease, diabetes, and high blood pressure? Blood pressure and heart disease High blood pressure causes heart disease and increases the risk of stroke. This is more likely to develop in people who have high blood pressure readings or are overweight. Talk with your health care provider about your target blood pressure readings. Have your blood pressure checked: Every 3-5 years if you are 18-39 years of age. Every year if you are 40 years old or older. If you are between the ages of 65 and 75 and are a current or former smoker, ask your health care provider if you should have a one-time screening for abdominal aortic aneurysm (AAA). Diabetes Have regular diabetes screenings. This checks your fasting blood sugar level. Have the screening done: Once every three years after age 45 if you are at a normal weight and have a low risk for diabetes. More often and at a younger age if you are overweight or have a high risk for diabetes. What should I know about preventing infection? Hepatitis B If you have a higher risk for hepatitis B, you should be screened for this virus. Talk with your health care provider to find out if you are at risk for hepatitis B infection. Hepatitis C Blood testing is recommended for: Everyone born from 1945 through 1965. Anyone with known risk factors for hepatitis C. Sexually transmitted infections (STIs) You should be screened each year for STIs, including gonorrhea and chlamydia, if: You are sexually active and are younger than 66 years of age. You are older than 66 years of age and your   health care provider tells you that you are at risk for this type of infection. Your sexual activity has changed since you were last screened, and you are at increased risk for chlamydia or gonorrhea. Ask your health care provider if you are at risk. Ask your health care provider about whether you are at high risk for HIV. Your health care provider  may recommend a prescription medicine to help prevent HIV infection. If you choose to take medicine to prevent HIV, you should first get tested for HIV. You should then be tested every 3 months for as long as you are taking the medicine. Follow these instructions at home: Alcohol use Do not drink alcohol if your health care provider tells you not to drink. If you drink alcohol: Limit how much you have to 0-2 drinks a day. Know how much alcohol is in your drink. In the [place] one drink equals one 12 oz bottle of beer (355 mL), one 5 oz glass of Kimara Bencomo (148 mL), or one 1 oz glass of hard liquor (44 mL). Lifestyle Do not use any products that contain nicotine or tobacco. These products include cigarettes, chewing tobacco, and vaping devices, such as e-cigarettes. If you need help quitting, ask your health care provider. Do not use street drugs. Do not share needles. Ask your health care provider for help if you need support or information about quitting drugs. General instructions Schedule regular health, dental, and eye exams. Stay current with your vaccines. Tell your health care provider if: You often feel depressed. You have ever been abused or do not feel safe at home. Summary Adopting a healthy lifestyle and getting preventive care are important in promoting health and wellness. Follow your health care provider's instructions about healthy diet, exercising, and getting tested or screened for diseases. Follow your health care provider's instructions on monitoring your cholesterol and blood pressure. This information is not intended to replace advice given to you by your health care provider. Make sure you discuss any questions you have with your health care provider. Document Revised: 02/28/2021 Document Reviewed: 02/28/2021 Elsevier Patient Education  2023 Elsevier Inc.  

## 2022-10-03 NOTE — Progress Notes (Signed)
Subjective:   [name] is a 66 y.o. male who presents for an Initial Medicare Annual Wellness Visit. I connected with  [name] on 10/03/22 by a audio enabled telemedicine application and verified that I am speaking with the correct person using two identifiers.  Patient Location: Home  Provider Location: Home Office  I discussed the limitations of evaluation and management by telemedicine. The patient expressed understanding and agreed to proceed.  Review of Systems    Deferred to PCP       Objective:    There were no vitals filed for this visit. There is no height or weight on file to calculate BMI.     10/03/2022    2:58 PM 05/04/2020    6:08 PM 03/13/2016   11:42 AM 12/02/2015    2:34 PM 03/05/2015    9:19 AM  Advanced Directives  Does Patient Have a Medical Advance Directive? Yes No No No No  Type of Paramedic of [place];Living will      Does patient want to make changes to medical advance directive? No - Patient declined      Copy of [place] in Chart? No - copy requested      Would patient like information on creating a medical advance directive?  No - Patient declined  No - patient declined information     Current Medications (verified) Outpatient Encounter Medications as of 10/03/2022  Medication Sig   aspirin EC 81 MG tablet Take 1 tablet (81 mg total) by mouth daily.   atorvastatin (LIPITOR) 80 MG tablet TAKE ONE TABLET BY MOUTH DAILY IN THE EVENING AT 6PM   carvedilol (COREG) 12.5 MG tablet TAKE ONE TABLET BY MOUTH TWICE DAILY with a meal   furosemide (LASIX) 40 MG tablet TAKE ONE TABLET BY MOUTH ONE TIME DAILY   meclizine (ANTIVERT) 25 MG tablet Take 1 tablet (25 mg total) by mouth Once PRN for up to 15 doses for dizziness.   Multiple Vitamin (MULTIVITAMIN WITH MINERALS) TABS tablet Take 1 tablet by mouth daily.   potassium chloride SA (KLOR-CON M) 20 MEQ tablet TAKE ONE TABLET BY MOUTH ONE TIME  DAILY   ramipril (ALTACE) 5 MG capsule TAKE ONE CAPSULE BY MOUTH ONE TIME DAILY   spironolactone (ALDACTONE) 25 MG tablet TAKE HALF TABLET BY MOUTH DAILY   No facility-administered encounter medications on file as of 10/03/2022.    Allergies (verified) Patient has no known allergies.   History: Past Medical History:  Diagnosis Date   Allergic rhinitis    Allergy    Bradycardia    He has a hx of   Congestive heart failure (HCC)    EF equals 40%   Coronary artery disease    Status post previous anterior wall myocardial infarction.    Dyslipidemia    He has a hx   GERD (gastroesophageal reflux disease)    History of gastroesophageal reflux (GERD)    Hypercholesterolemia    He has a hx of    Hypertension    Myocardial infarction ([place]) 2008   OSA on CPAP    Sleep apnea    wears c-pap   Past Surgical History:  Procedure Laterality Date   APPENDECTOMY  Age 21   CARDIAC CATHETERIZATION     His EF is 40-45%. This has increased from 30% at the time of  last year(2008-9)   CORONARY ARTERY BYPASS GRAFT     KNEE SURGERY  2006   SHOULDER SURGERY  left   TONSILLECTOMY     US ECHOCARDIOGRAPHY  10-25-2007   Est EF 35-40%   Family History  Problem Relation Age of Onset   Heart attack Father    Kidney cancer Mother    Colon cancer Neg Hx    Esophageal cancer Neg Hx    Pancreatic cancer Neg Hx    Prostate cancer Neg Hx    Rectal cancer Neg Hx    Stomach cancer Neg Hx    Social History   Socioeconomic History   Marital status: Married    Spouse name: [name]   Number of children: 1   Years of education: Not on file   Highest education level: Not on file  Occupational History   Occupation: high school custodian  Tobacco Use   Smoking status: Never   Smokeless tobacco: Never  Vaping Use   Vaping Use: Never used  Substance and Sexual Activity   Alcohol use: No    Alcohol/week: 0.0 standard drinks of alcohol    Comment: rare   Drug use: No   Sexual activity: Not  Currently  Other Topics Concern   Not on file  Social History Narrative   Not on file   Social Determinants of Health   Financial Resource Strain: Low Risk  (10/03/2022)   Overall Financial Resource Strain (CARDIA)    Difficulty of Paying Living Expenses: Not hard at all  Food Insecurity: No Food Insecurity (10/03/2022)   Hunger Vital Sign    Worried About Running Out of Food in the Last Year: Never true    [place] in the Last Year: Never true  Transportation Needs: No Transportation Needs (10/03/2022)   PRAPARE - Hydrologist (Medical): No    Lack of Transportation (Non-Medical): No  Physical Activity: Insufficiently Active (10/03/2022)   Exercise Vital Sign    Days of Exercise per Week: 3 days    Minutes of Exercise per Session: 40 min  Stress: No Stress Concern Present (10/03/2022)   [place]    Feeling of Stress : Not at all  Social Connections: [place] (10/03/2022)   Social Connection and Isolation Panel [NHANES]    Frequency of Communication with Friends and Family: More than three times a week    Frequency of Social Gatherings with Friends and Family: More than three times a week    Attends Religious Services: More than 4 times per year    Active Member of [employer] or Organizations: Yes    Attends Music therapist: More than 4 times per year    Marital Status: Married    Tobacco Counseling Counseling given: Not Answered   Clinical Intake:  Pre-visit preparation completed: Yes  Pain : No/denies pain     Nutritional Status: BMI > 30  Obese Nutritional Risks: None Diabetes: No  How often do you need to have someone help you when you read instructions, pamphlets, or other written materials from your doctor or pharmacy?: 1 - Never  Diabetic?No  Interpreter Needed?: No  Information entered by :: Emelia Loron RN   Activities of Daily  Living    10/03/2022    2:57 PM  In your present state of health, do you have any difficulty performing the following activities:  Hearing? 0  Vision? 0    Patient Care Team: Horald Pollen, MD as PCP - General (Internal Medicine) Nahser, Wonda Cheng, MD as PCP - Cardiology (  Cardiology)  Indicate any recent Medical Services you may have received from other than [facility] providers in the past year (date may be approximate).     Assessment:   This is a routine wellness examination for Zigmund.  Hearing/Vision screen No results found.  Dietary issues and exercise activities discussed:     Goals Addressed             This Visit's Progress    Patient Stated       Increase my physical activity by walking more routinely.      Depression Screen    10/03/2022    2:54 PM 04/04/2022    8:03 AM 09/28/2021    8:21 AM  PHQ 2/9 Scores  PHQ - 2 Score 0 0 0    Fall Risk    10/03/2022    2:59 PM 04/04/2022    8:03 AM 09/28/2021    8:21 AM  [place] in the past year? 0 0 0  Number falls in past yr: 0 0 0  Injury with Fall? 0 0 0  Risk for fall due to : No Fall Risks    Follow up Falls evaluation completed      FALL RISK PREVENTION PERTAINING TO THE HOME:  Any stairs in or around the home? Yes  If so, are there any without handrails? No  Home free of loose throw rugs in walkways, pet beds, electrical cords, etc? Yes  Adequate lighting in your home to reduce risk of falls? Yes   ASSISTIVE DEVICES UTILIZED TO PREVENT FALLS:  Life alert? No  Use of a cane, walker or w/c? No  Grab bars in the bathroom? No  Shower chair or bench in shower? No  Elevated toilet seat or a handicapped toilet? No   Cognitive Function:        10/03/2022    3:00 PM  6CIT Screen  What Year? 0 points  What month? 0 points  What time? 0 points  Count back from 20 0 points  Months in reverse 0 points  Repeat phrase 4 points  Total Score 4 points     Immunizations Immunization History  Administered Date(s) Administered   Influenza Split 07/24/2011, 07/23/2013, 07/24/2015, 07/23/2017, 08/23/2020   Influenza,inj,Quad PF,6+ Mos 07/23/2014, 07/23/2016, 08/21/2018, 08/27/2019, 08/30/2020   Influenza-Unspecified 07/24/2011, 07/23/2013, 07/23/2014, 07/24/2015, 07/23/2016, 07/23/2016, 07/23/2017, 07/23/2021   Moderna Sars-Covid-2 Vaccination 01/26/2020, 03/15/2020   Pneumococcal Polysaccharide-23 09/28/2016, 09/28/2016   Tdap 04/04/2022    TDAP status: Up to date  Flu Vaccine status: Due, Education has been provided regarding the importance of this vaccine. Advised may receive this vaccine at local pharmacy or Health Dept. Aware to provide a copy of the vaccination record if obtained from local pharmacy or Health Dept. Verbalized acceptance and understanding.  Pneumococcal vaccine status: Due, Education has been provided regarding the importance of this vaccine. Advised may receive this vaccine at local pharmacy or Health Dept. Aware to provide a copy of the vaccination record if obtained from local pharmacy or Health Dept. Verbalized acceptance and understanding.  Covid-19 vaccine status: Information provided on how to obtain vaccines.   Qualifies for Shingles Vaccine? Yes   Zostavax completed No   Shingrix Completed?: No.    Education has been provided regarding the importance of this vaccine. Patient has been advised to call insurance company to determine out of pocket expense if they have not yet received this vaccine. Advised may also receive vaccine at local pharmacy or Health Dept. Verbalized  acceptance and understanding.  Screening Tests Health Maintenance  Topic Date Due   Hepatitis C Screening  Never done   Zoster Vaccines- Shingrix (1 of 2) Never done   Pneumonia Vaccine 50+ Years old (2 - PCV) 03/18/2021   COVID-19 Vaccine (3 - 2023-24 season) 06/23/2022   INFLUENZA VACCINE  01/21/2023 (Originally 05/23/2022)   Medicare  Annual Wellness (AWV)  10/04/2023   COLONOSCOPY (Pts 45-58yr Insurance coverage will need to be confirmed)  01/17/2026   DTaP/Tdap/Td (2 - Td or Tdap) 04/04/2032   HPV VACCINES  Aged Out    Health Maintenance  Health Maintenance Due  Topic Date Due   Hepatitis C Screening  Never done   Zoster Vaccines- Shingrix (1 of 2) Never done   Pneumonia Vaccine 66 Years old (2 - PCV) 03/18/2021   COVID-19 Vaccine (3 - 2023-24 season) 06/23/2022    Colorectal cancer screening: Type of screening: Colonoscopy. Completed 01/18/16. Repeat every 10 years  Lung Cancer Screening: (Low Dose CT Chest recommended if Age 66-80years, 30 pack-year currently smoking OR have quit w/in 15years.) does not qualify.   Additional Screening:  Hepatitis C Screening: does qualify; Completed education provided  Vision Screening: Recommended annual ophthalmology exams for early detection of glaucoma and other disorders of the eye. Is the patient up to date with their annual eye exam?  Yes  Who is the provider or what is the name of the office in which the patient attends annual eye exams? Dr. PPhineas DouglasIf pt is not established with a provider, would they like to be referred to a provider to establish care?  N/A .   Dental Screening: Recommended annual dental exams for proper oral hygiene  Community Resource Referral / Chronic Care Management: CRR required this visit?  No   CCM required this visit?  No      Plan:     I have personally reviewed and noted the following in the patient's chart:   Medical and social history Use of alcohol, tobacco or illicit drugs  Current medications and supplements including opioid prescriptions. Patient is not currently taking opioid prescriptions. Functional ability and status Nutritional status Physical activity Advanced directives List of other physicians Hospitalizations, surgeries, and ER visits in previous 12 months Vitals Screenings to include cognitive,  depression, and falls Referrals and appointments  In addition, I have reviewed and discussed with patient certain preventive protocols, quality metrics, and best practice recommendations. A written personalized care plan for preventive services as well as general preventive health recommendations were provided to patient.     JMichiel Cowboy RN   10/03/2022   Nurse Notes:  Mr. CMcniel, Thank you for taking time to come for your Medicare Wellness Visit. I appreciate your ongoing commitment to your health goals. Please review the following plan we discussed and let me know if I can assist you in the future.   These are the goals we discussed:  Goals      Patient Stated     Increase my physical activity by walking more routinely.        This is a list of the screening recommended for you and due dates:  Health Maintenance  Topic Date Due   Hepatitis C Screening: USPSTF Recommendation to screen - Ages 191-79yo.  Never done   Zoster (Shingles) Vaccine (1 of 2) Never done   Pneumonia Vaccine (2 - PCV) 03/18/2021   COVID-19 Vaccine (3 - 2023-24 season) 06/23/2022   Flu Shot  01/21/2023*  Medicare Annual Wellness Visit  10/04/2023   Colon Cancer Screening  01/17/2026   DTaP/Tdap/Td vaccine (2 - Td or Tdap) 04/04/2032   HPV Vaccine  Aged Out  *Topic was postponed. The date shown is not the original due date.

## 2022-10-04 ENCOUNTER — Ambulatory Visit: Payer: Medicare PPO | Admitting: Emergency Medicine

## 2022-10-04 ENCOUNTER — Encounter: Payer: Self-pay | Admitting: Emergency Medicine

## 2022-10-04 VITALS — BP 118/70 | HR 60 | Temp 97.7°F | Ht 66.0 in | Wt 212.0 lb

## 2022-10-04 DIAGNOSIS — Z951 Presence of aortocoronary bypass graft: Secondary | ICD-10-CM

## 2022-10-04 DIAGNOSIS — I2510 Atherosclerotic heart disease of native coronary artery without angina pectoris: Secondary | ICD-10-CM

## 2022-10-04 DIAGNOSIS — I5022 Chronic systolic (congestive) heart failure: Secondary | ICD-10-CM | POA: Diagnosis not present

## 2022-10-04 DIAGNOSIS — E782 Mixed hyperlipidemia: Secondary | ICD-10-CM

## 2022-10-04 DIAGNOSIS — G4733 Obstructive sleep apnea (adult) (pediatric): Secondary | ICD-10-CM

## 2022-10-04 DIAGNOSIS — J988 Other specified respiratory disorders: Secondary | ICD-10-CM

## 2022-10-04 DIAGNOSIS — I10 Essential (primary) hypertension: Secondary | ICD-10-CM | POA: Diagnosis not present

## 2022-10-04 DIAGNOSIS — B9789 Other viral agents as the cause of diseases classified elsewhere: Secondary | ICD-10-CM | POA: Diagnosis not present

## 2022-10-04 NOTE — Assessment & Plan Note (Signed)
Well-controlled hypertension. Continue ramipril 5 mg daily and Aldactone 25 mg daily Cardiovascular risk associated with hypertension discussed Diet and nutrition discussed.

## 2022-10-04 NOTE — Assessment & Plan Note (Signed)
Clinically euvolemic.  Continue furosemide 40 mg daily.

## 2022-10-04 NOTE — Assessment & Plan Note (Signed)
Running its course without complications. No red flag signs or symptoms. Continue over-the-counter Mucinex DM and stay well-hydrated

## 2022-10-04 NOTE — Assessment & Plan Note (Signed)
Stable.  Diet and nutrition discussed. Continue atorvastatin 80 mg daily.

## 2022-10-04 NOTE — Progress Notes (Signed)
[name] 66 y.o.   Chief Complaint  Patient presents with   Follow-up    Had a cold     HISTORY OF PRESENT ILLNESS: This is a 66 y.o. male here for 34-monthfollow-up of chronic medical problems. Overall doing well. Dealing with flulike symptoms that started 2 weeks ago.  Slowly getting better.  No complications. No other complaints or medical concerns today. Most recent cardiologist office visit notes, assessment and plan as follows: Assessment / Plan:    1. CAD - s/p CABG, - He had an echocardiogram in December, 2021 which revealed mildly depressed left ventricular systolic function with an EF of 45 to 50%.     2. Chronic systolic CHF-   no CHF signs or symptoms      3. Hyperlipidemia - lipids have been well controlled.  Check labs today        PMertie Moores MD  04/14/2022 9:34 AM    [address]  [place]  [id number] [phone]: [phone] Fax: [phone] HPI   Prior to Admission medications   Medication Sig Start Date End Date Taking? Authorizing Provider  aspirin EC 81 MG tablet Take 1 tablet (81 mg total) by mouth daily. 08/17/14  Yes Nahser, PWonda Cheng MD  atorvastatin (LIPITOR) 80 MG tablet TAKE ONE TABLET BY MOUTH DAILY IN THE EVENING AT 6PM 11/08/21  Yes Nahser, PWonda Cheng MD  carvedilol (COREG) 12.5 MG tablet TAKE ONE TABLET BY MOUTH TWICE DAILY with a meal 11/08/21  Yes Nahser, PWonda Cheng MD  furosemide (LASIX) 40 MG tablet TAKE ONE TABLET BY MOUTH ONE TIME DAILY 11/08/21  Yes Nahser, PWonda Cheng MD  meclizine (ANTIVERT) 25 MG tablet Take 1 tablet (25 mg total) by mouth Once PRN for up to 15 doses for dizziness. 05/04/20  Yes FMarcello Fennel PA-C  Multiple Vitamin (MULTIVITAMIN WITH MINERALS) TABS tablet Take 1 tablet by mouth daily.   Yes [provider]  potassium chloride SA (KLOR-CON M) 20 MEQ tablet TAKE ONE TABLET BY MOUTH ONE TIME DAILY 02/03/22  Yes Nahser, PWonda Cheng MD  ramipril (ALTACE) 5 MG capsule TAKE ONE CAPSULE BY MOUTH ONE TIME DAILY 11/08/21  Yes Nahser, PWonda Cheng MD  spironolactone (ALDACTONE) 25 MG tablet TAKE HALF TABLET BY MOUTH DAILY 11/08/21  Yes Nahser, PWonda Cheng MD    No Known Allergies  Patient Active Problem List   Diagnosis Date Noted   Sensorineural hearing loss (SNHL) of left ear with unrestricted hearing of right ear 05/11/2021   Vertigo 109/73/5329  Chronic systolic heart failure ([place] 08/17/2014   ED (erectile dysfunction) of organic origin 10/21/2013   S/P CABG x 3 10/21/2013   Hyperlipidemia 11/26/2007   Essential hypertension 11/26/2007   Coronary atherosclerosis 11/26/2007   Perennial allergic rhinitis with seasonal variation 11/26/2007   Obstructive sleep apnea syndrome 11/26/2007    Past Medical History:  Diagnosis Date   Allergic rhinitis    Allergy    Bradycardia    He has a hx of   Congestive heart failure (HCC)    EF equals 40%   Coronary artery disease    Status post previous anterior wall myocardial infarction.    Dyslipidemia    He has a hx   GERD (gastroesophageal reflux disease)    History of gastroesophageal reflux (GERD)    Hypercholesterolemia    He has a hx of    Hypertension  Myocardial infarction ([place]) 2008   OSA on CPAP    Sleep apnea    wears c-pap    Past Surgical History:  Procedure Laterality Date   APPENDECTOMY  Age 23   CARDIAC CATHETERIZATION     His EF is 40-45%. This has increased from 30% at the time of  last year(2008-9)   CORONARY ARTERY BYPASS GRAFT     KNEE SURGERY  2006   SHOULDER SURGERY     left   TONSILLECTOMY     US ECHOCARDIOGRAPHY  10-25-2007   Est EF 35-40%    Social History   Socioeconomic History   Marital status: Married    Spouse name: [name]   Number of children: 1   Years of education: Not on file   Highest education level: Not on file  Occupational History   Occupation: high school custodian  Tobacco Use   Smoking status: Never    Smokeless tobacco: Never  Vaping Use   Vaping Use: Never used  Substance and Sexual Activity   Alcohol use: No    Alcohol/week: 0.0 standard drinks of alcohol    Comment: rare   Drug use: No   Sexual activity: Not Currently  Other Topics Concern   Not on file  Social History Narrative   Not on file   Social Determinants of Health   Financial Resource Strain: Low Risk  (10/03/2022)   Overall Financial Resource Strain (CARDIA)    Difficulty of Paying Living Expenses: Not hard at all  Food Insecurity: No Food Insecurity (10/03/2022)   Hunger Vital Sign    Worried About Running Out of Food in the Last Year: Never true    [place] in the Last Year: Never true  Transportation Needs: No Transportation Needs (10/03/2022)   PRAPARE - Hydrologist (Medical): No    Lack of Transportation (Non-Medical): No  Physical Activity: Insufficiently Active (10/03/2022)   Exercise Vital Sign    Days of Exercise per Week: 3 days    Minutes of Exercise per Session: 40 min  Stress: No Stress Concern Present (10/03/2022)   [place]    Feeling of Stress : Not at all  Social Connections: [place] (10/03/2022)   Social Connection and Isolation Panel [NHANES]    Frequency of Communication with Friends and Family: More than three times a week    Frequency of Social Gatherings with Friends and Family: More than three times a week    Attends Religious Services: More than 4 times per year    Active Member of [employer] or Organizations: Yes    Attends Music therapist: More than 4 times per year    Marital Status: Married  Human resources officer Violence: Not At Risk (10/03/2022)   Humiliation, Afraid, Rape, and Kick questionnaire    Fear of Current or Ex-Partner: No    Emotionally Abused: No    Physically Abused: No    Sexually Abused: No    Family History  Problem Relation Age of  Onset   Heart attack Father    Kidney cancer Mother    Colon cancer Neg Hx    Esophageal cancer Neg Hx    Pancreatic cancer Neg Hx    Prostate cancer Neg Hx    Rectal cancer Neg Hx    Stomach cancer Neg Hx      Review of Systems  Constitutional: Negative.  Negative for chills and fever.  HENT:  Positive for congestion.   Respiratory:  Positive for cough.   Cardiovascular: Negative.  Negative for chest pain and palpitations.  Gastrointestinal:  Negative for abdominal pain, nausea and vomiting.  Skin: Negative.  Negative for rash.  Neurological:  Negative for dizziness and headaches.  All other systems reviewed and are negative.   Today's Vitals   10/04/22 0807  BP: 118/70  Pulse: 60  Temp: 97.7 F (36.5 C)  TempSrc: Oral  SpO2: 98%  Weight: 212 lb (96.2 kg)  Height: '5\' 6"'$  (1.676 m)   Body mass index is 34.22 kg/m.  Physical Exam Vitals reviewed.  Constitutional:      Appearance: Normal appearance.  HENT:     Right Ear: Tympanic membrane, ear canal and external ear normal.     Left Ear: Tympanic membrane, ear canal and external ear normal.     Mouth/Throat:     Mouth: Mucous membranes are moist.     Pharynx: Oropharynx is clear.  Eyes:     Extraocular Movements: Extraocular movements intact.     Conjunctiva/sclera: Conjunctivae normal.     Pupils: Pupils are equal, round, and reactive to light.  Cardiovascular:     Rate and Rhythm: Normal rate and regular rhythm.     Pulses: Normal pulses.     Heart sounds: Normal heart sounds.  Pulmonary:     Effort: Pulmonary effort is normal.     Breath sounds: Normal breath sounds.  Abdominal:     Palpations: Abdomen is soft.     Tenderness: There is no abdominal tenderness.  Musculoskeletal:     Cervical back: No tenderness.  Lymphadenopathy:     Cervical: No cervical adenopathy.  Skin:    General: Skin is warm and dry.  Neurological:     General: No focal deficit present.     Mental Status: He is alert and  oriented to person, place, and time.  Psychiatric:        Mood and Affect: Mood normal.        Behavior: Behavior normal.      ASSESSMENT & PLAN: A total of 45 minutes was spent with the patient and counseling/coordination of care regarding preparing for this visit, review of most recent office visit notes, review of most recent cardiologist office visit notes, review of multiple chronic medical conditions and their management, review of all medications, education on nutrition, review of most recent blood work results, prognosis, documentation, and need for follow-up  Problem List Items Addressed This Visit       Cardiovascular and Mediastinum   Essential hypertension - Primary    Well-controlled hypertension. Continue ramipril 5 mg daily and Aldactone 25 mg daily Cardiovascular risk associated with hypertension discussed Diet and nutrition discussed.      Coronary atherosclerosis    Stable.  No recent anginal episodes. Continue beta-blocker with carvedilol 12.5 mg twice a day and daily baby aspirin      Chronic systolic heart failure (HCC)    Clinically euvolemic.  Continue furosemide 40 mg daily.        Respiratory   Obstructive sleep apnea syndrome    Stable on CPAP treatment.      Viral respiratory infection    Running its course without complications. No red flag signs or symptoms. Continue over-the-counter Mucinex DM and stay well-hydrated        Other   Hyperlipidemia    Stable.  Diet and nutrition discussed. Continue atorvastatin 80 mg daily.  S/P CABG x 3   Patient Instructions  Health Maintenance After Age 20 After age 23, you are at a higher risk for certain long-term diseases and infections as well as injuries from falls. Falls are a major cause of broken bones and head injuries in people who are older than age 57. Getting regular preventive care can help to keep you healthy and well. Preventive care includes getting regular testing and making  lifestyle changes as recommended by your health care provider. Talk with your health care provider about: Which screenings and tests you should have. A screening is a test that checks for a disease when you have no symptoms. A diet and exercise plan that is right for you. What should I know about screenings and tests to prevent falls? Screening and testing are the best ways to find a health problem early. Early diagnosis and treatment give you the best chance of managing medical conditions that are common after age 69. Certain conditions and lifestyle choices may make you more likely to have a fall. Your health care provider may recommend: Regular vision checks. Poor vision and conditions such as cataracts can make you more likely to have a fall. If you wear glasses, make sure to get your prescription updated if your vision changes. Medicine review. Work with your health care provider to regularly review all of the medicines you are taking, including over-the-counter medicines. Ask your health care provider about any side effects that may make you more likely to have a fall. Tell your health care provider if any medicines that you take make you feel dizzy or sleepy. Strength and balance checks. Your health care provider may recommend certain tests to check your strength and balance while standing, walking, or changing positions. Foot health exam. Foot pain and numbness, as well as not wearing proper footwear, can make you more likely to have a fall. Screenings, including: Osteoporosis screening. Osteoporosis is a condition that causes the bones to get weaker and break more easily. Blood pressure screening. Blood pressure changes and medicines to control blood pressure can make you feel dizzy. Depression screening. You may be more likely to have a fall if you have a fear of falling, feel depressed, or feel unable to do activities that you used to do. Alcohol use screening. Using too much alcohol can  affect your balance and may make you more likely to have a fall. Follow these instructions at home: Lifestyle Do not drink alcohol if: Your health care provider tells you not to drink. If you drink alcohol: Limit how much you have to: 0-1 drink a day for women. 0-2 drinks a day for men. Know how much alcohol is in your drink. In the [place] one drink equals one 12 oz bottle of beer (355 mL), one 5 oz glass of wine (148 mL), or one 1 oz glass of hard liquor (44 mL). Do not use any products that contain nicotine or tobacco. These products include cigarettes, chewing tobacco, and vaping devices, such as e-cigarettes. If you need help quitting, ask your health care provider. Activity  Follow a regular exercise program to stay fit. This will help you maintain your balance. Ask your health care provider what types of exercise are appropriate for you. If you need a cane or walker, use it as recommended by your health care provider. Wear supportive shoes that have nonskid soles. Safety  Remove any tripping hazards, such as rugs, cords, and clutter. Install safety equipment such as grab bars in  bathrooms and safety rails on stairs. Keep rooms and walkways well-lit. General instructions Talk with your health care provider about your risks for falling. Tell your health care provider if: You fall. Be sure to tell your health care provider about all falls, even ones that seem minor. You feel dizzy, tiredness (fatigue), or off-balance. Take over-the-counter and prescription medicines only as told by your health care provider. These include supplements. Eat a healthy diet and maintain a healthy weight. A healthy diet includes low-fat dairy products, low-fat (lean) meats, and fiber from whole grains, beans, and lots of fruits and vegetables. Stay current with your vaccines. Schedule regular health, dental, and eye exams. Summary Having a healthy lifestyle and getting preventive care can help to protect  your health and wellness after age 85. Screening and testing are the best way to find a health problem early and help you avoid having a fall. Early diagnosis and treatment give you the best chance for managing medical conditions that are more common for people who are older than age 75. Falls are a major cause of broken bones and head injuries in people who are older than age 29. Take precautions to prevent a fall at home. Work with your health care provider to learn what changes you can make to improve your health and wellness and to prevent falls. This information is not intended to replace advice given to you by your health care provider. Make sure you discuss any questions you have with your health care provider. Document Revised: 02/28/2021 Document Reviewed: 02/28/2021 Elsevier Patient Education  [place], MD [facility]

## 2022-10-04 NOTE — Assessment & Plan Note (Signed)
Stable.  No recent anginal episodes. Continue beta-blocker with carvedilol 12.5 mg twice a day and daily baby aspirin

## 2022-10-04 NOTE — Assessment & Plan Note (Signed)
Stable on CPAP treatment.

## 2022-10-04 NOTE — Patient Instructions (Signed)
Health Maintenance After Age 66 After age 65, you are at a higher risk for certain long-term diseases and infections as well as injuries from falls. Falls are a major cause of broken bones and head injuries in people who are older than age 66. Getting regular preventive care can help to keep you healthy and well. Preventive care includes getting regular testing and making lifestyle changes as recommended by your health care provider. Talk with your health care provider about: Which screenings and tests you should have. A screening is a test that checks for a disease when you have no symptoms. A diet and exercise plan that is right for you. What should I know about screenings and tests to prevent falls? Screening and testing are the best ways to find a health problem early. Early diagnosis and treatment give you the best chance of managing medical conditions that are common after age 66. Certain conditions and lifestyle choices may make you more likely to have a fall. Your health care provider may recommend: Regular vision checks. Poor vision and conditions such as cataracts can make you more likely to have a fall. If you wear glasses, make sure to get your prescription updated if your vision changes. Medicine review. Work with your health care provider to regularly review all of the medicines you are taking, including over-the-counter medicines. Ask your health care provider about any side effects that may make you more likely to have a fall. Tell your health care provider if any medicines that you take make you feel dizzy or sleepy. Strength and balance checks. Your health care provider may recommend certain tests to check your strength and balance while standing, walking, or changing positions. Foot health exam. Foot pain and numbness, as well as not wearing proper footwear, can make you more likely to have a fall. Screenings, including: Osteoporosis screening. Osteoporosis is a condition that causes  the bones to get weaker and break more easily. Blood pressure screening. Blood pressure changes and medicines to control blood pressure can make you feel dizzy. Depression screening. You may be more likely to have a fall if you have a fear of falling, feel depressed, or feel unable to do activities that you used to do. Alcohol use screening. Using too much alcohol can affect your balance and may make you more likely to have a fall. Follow these instructions at home: Lifestyle Do not drink alcohol if: Your health care provider tells you not to drink. If you drink alcohol: Limit how much you have to: 0-1 drink a day for women. 0-2 drinks a day for men. Know how much alcohol is in your drink. In the [place] one drink equals one 12 oz bottle of beer (355 mL), one 5 oz glass of wine (148 mL), or one 1 oz glass of hard liquor (44 mL). Do not use any products that contain nicotine or tobacco. These products include cigarettes, chewing tobacco, and vaping devices, such as e-cigarettes. If you need help quitting, ask your health care provider. Activity  Follow a regular exercise program to stay fit. This will help you maintain your balance. Ask your health care provider what types of exercise are appropriate for you. If you need a cane or walker, use it as recommended by your health care provider. Wear supportive shoes that have nonskid soles. Safety  Remove any tripping hazards, such as rugs, cords, and clutter. Install safety equipment such as grab bars in bathrooms and safety rails on stairs. Keep rooms and walkways   well-lit. General instructions Talk with your health care provider about your risks for falling. Tell your health care provider if: You fall. Be sure to tell your health care provider about all falls, even ones that seem minor. You feel dizzy, tiredness (fatigue), or off-balance. Take over-the-counter and prescription medicines only as told by your health care provider. These include  supplements. Eat a healthy diet and maintain a healthy weight. A healthy diet includes low-fat dairy products, low-fat (lean) meats, and fiber from whole grains, beans, and lots of fruits and vegetables. Stay current with your vaccines. Schedule regular health, dental, and eye exams. Summary Having a healthy lifestyle and getting preventive care can help to protect your health and wellness after age 66. Screening and testing are the best way to find a health problem early and help you avoid having a fall. Early diagnosis and treatment give you the best chance for managing medical conditions that are more common for people who are older than age 66. Falls are a major cause of broken bones and head injuries in people who are older than age 66. Take precautions to prevent a fall at home. Work with your health care provider to learn what changes you can make to improve your health and wellness and to prevent falls. This information is not intended to replace advice given to you by your health care provider. Make sure you discuss any questions you have with your health care provider. Document Revised: 02/28/2021 Document Reviewed: 02/28/2021 Elsevier Patient Education  2023 Elsevier Inc.  

## 2022-10-25 ENCOUNTER — Other Ambulatory Visit: Payer: Self-pay | Admitting: Cardiovascular Disease

## 2023-02-20 DIAGNOSIS — G4733 Obstructive sleep apnea (adult) (pediatric): Secondary | ICD-10-CM | POA: Diagnosis not present

## 2023-04-05 ENCOUNTER — Ambulatory Visit: Payer: Medicare PPO | Admitting: Emergency Medicine

## 2023-04-10 ENCOUNTER — Ambulatory Visit: Payer: Medicare PPO | Admitting: Emergency Medicine

## 2023-04-11 ENCOUNTER — Encounter: Payer: Self-pay | Admitting: Emergency Medicine

## 2023-04-11 ENCOUNTER — Ambulatory Visit: Payer: Medicare PPO | Admitting: Emergency Medicine

## 2023-04-11 VITALS — BP 116/78 | HR 56 | Temp 97.6°F | Ht 66.0 in | Wt 209.4 lb

## 2023-04-11 DIAGNOSIS — I5022 Chronic systolic (congestive) heart failure: Secondary | ICD-10-CM | POA: Diagnosis not present

## 2023-04-11 DIAGNOSIS — E785 Hyperlipidemia, unspecified: Secondary | ICD-10-CM | POA: Diagnosis not present

## 2023-04-11 DIAGNOSIS — I2510 Atherosclerotic heart disease of native coronary artery without angina pectoris: Secondary | ICD-10-CM

## 2023-04-11 DIAGNOSIS — G4733 Obstructive sleep apnea (adult) (pediatric): Secondary | ICD-10-CM

## 2023-04-11 DIAGNOSIS — I10 Essential (primary) hypertension: Secondary | ICD-10-CM | POA: Diagnosis not present

## 2023-04-11 LAB — COMPREHENSIVE METABOLIC PANEL
ALT: 27 U/L (ref 0–53)
AST: 23 U/L (ref 0–37)
Albumin: 4.4 g/dL (ref 3.5–5.2)
Alkaline Phosphatase: 66 U/L (ref 39–117)
BUN: 17 mg/dL (ref 6–23)
CO2: 26 mEq/L (ref 19–32)
Calcium: 9.2 mg/dL (ref 8.4–10.5)
Chloride: 103 mEq/L (ref 96–112)
Creatinine, Ser: 0.95 mg/dL (ref 0.40–1.50)
GFR: 83.08 mL/min (ref >60.00)
Glucose, Bld: 97 mg/dL (ref 70–99)
Potassium: 4 mEq/L (ref 3.5–5.1)
Sodium: 137 mEq/L (ref 135–145)
Total Bilirubin: 1.1 mg/dL (ref 0.2–1.2)
Total Protein: 7.4 g/dL (ref 6.0–8.3)

## 2023-04-11 LAB — LIPID PANEL
Cholesterol: 118 mg/dL (ref 0–200)
HDL: 34 mg/dL — ABNORMAL LOW (ref >39.00)
LDL Cholesterol: 54 mg/dL (ref 0–99)
NonHDL: 84.01
Total CHOL/HDL Ratio: 3
Triglycerides: 150 mg/dL — ABNORMAL HIGH (ref 0.0–149.0)
VLDL: 30 mg/dL (ref 0.0–40.0)

## 2023-04-11 LAB — CBC WITH DIFFERENTIAL/PLATELET
Basophils Absolute: 0 10*3/uL (ref 0.0–0.1)
Basophils Relative: 0.5 % (ref 0.0–3.0)
Eosinophils Absolute: 0.2 10*3/uL (ref 0.0–0.7)
Eosinophils Relative: 2.7 % (ref 0.0–5.0)
HCT: 42.8 % (ref 39.0–52.0)
Hemoglobin: 14.6 g/dL (ref 13.0–17.0)
Lymphocytes Relative: 36.7 % (ref 12.0–46.0)
Lymphs Abs: 2.1 10*3/uL (ref 0.7–4.0)
MCHC: 34 g/dL (ref 30.0–36.0)
MCV: 87.8 fl (ref 78.0–100.0)
Monocytes Absolute: 0.4 10*3/uL (ref 0.1–1.0)
Monocytes Relative: 6.9 % (ref 3.0–12.0)
Neutro Abs: 3 10*3/uL (ref 1.4–7.7)
Neutrophils Relative %: 53.2 % (ref 43.0–77.0)
Platelets: 236 10*3/uL (ref 150.0–400.0)
RBC: 4.88 Mil/uL (ref 4.22–5.81)
RDW: 13.4 % (ref 11.5–15.5)
WBC: 5.6 10*3/uL (ref 4.0–10.5)

## 2023-04-11 NOTE — Assessment & Plan Note (Signed)
Stable.  On CPAP treatment. 

## 2023-04-11 NOTE — Assessment & Plan Note (Signed)
No signs or symptoms of acute congestive heart failure Not retaining fluid.  Euvolemic Wt Readings from Last 3 Encounters:  04/11/23 209 lb 6 oz (95 kg)  10/04/22 212 lb (96.2 kg)  04/14/22 208 lb 6.4 oz (94.5 kg)  Continues furosemide 40 mg daily, ramipril 5 mg daily and Aldactone 12.5 mg daily Also on carvedilol 12.5 mg twice a day

## 2023-04-11 NOTE — Patient Instructions (Signed)
Hypertension, Adult High blood pressure (hypertension) is when the force of blood pumping through the arteries is too strong. The arteries are the blood vessels that carry blood from the heart throughout the body. Hypertension forces the heart to work harder to pump blood and may cause arteries to become narrow or stiff. Untreated or uncontrolled hypertension can lead to a heart attack, heart failure, a stroke, kidney disease, and other problems. A blood pressure reading consists of a higher number over a lower number. Ideally, your blood pressure should be below 120/80. The first ("top") number is called the systolic pressure. It is a measure of the pressure in your arteries as your heart beats. The second ("bottom") number is called the diastolic pressure. It is a measure of the pressure in your arteries as the heart relaxes. What are the causes? The exact cause of this condition is not known. There are some conditions that result in high blood pressure. What increases the risk? Certain factors may make you more likely to develop high blood pressure. Some of these risk factors are under your control, including: Smoking. Not getting enough exercise or physical activity. Being overweight. Having too much fat, sugar, calories, or salt (sodium) in your diet. Drinking too much alcohol. Other risk factors include: Having a personal history of heart disease, diabetes, high cholesterol, or kidney disease. Stress. Having a family history of high blood pressure and high cholesterol. Having obstructive sleep apnea. Age. The risk increases with age. What are the signs or symptoms? High blood pressure may not cause symptoms. Very high blood pressure (hypertensive crisis) may cause: Headache. Fast or irregular heartbeats (palpitations). Shortness of breath. Nosebleed. Nausea and vomiting. Vision changes. Severe chest pain, dizziness, and seizures. How is this diagnosed? This condition is diagnosed by  measuring your blood pressure while you are seated, with your arm resting on a flat surface, your legs uncrossed, and your feet flat on the floor. The cuff of the blood pressure monitor will be placed directly against the skin of your upper arm at the level of your heart. Blood pressure should be measured at least twice using the same arm. Certain conditions can cause a difference in blood pressure between your right and left arms. If you have a high blood pressure reading during one visit or you have normal blood pressure with other risk factors, you may be asked to: Return on a different day to have your blood pressure checked again. Monitor your blood pressure at home for 1 week or longer. If you are diagnosed with hypertension, you may have other blood or imaging tests to help your health care provider understand your overall risk for other conditions. How is this treated? This condition is treated by making healthy lifestyle changes, such as eating healthy foods, exercising more, and reducing your alcohol intake. You may be referred for counseling on a healthy diet and physical activity. Your health care provider may prescribe medicine if lifestyle changes are not enough to get your blood pressure under control and if: Your systolic blood pressure is above 130. Your diastolic blood pressure is above 80. Your personal target blood pressure may vary depending on your medical conditions, your age, and other factors. Follow these instructions at home: Eating and drinking  Eat a diet that is high in fiber and potassium, and low in sodium, added sugar, and fat. An example of this eating plan is called the DASH diet. DASH stands for Dietary Approaches to Stop Hypertension. To eat this way: Eat   plenty of fresh fruits and vegetables. Try to fill one half of your plate at each meal with fruits and vegetables. Eat whole grains, such as whole-wheat pasta, brown rice, or whole-grain bread. Fill about one  fourth of your plate with whole grains. Eat or drink low-fat dairy products, such as skim milk or low-fat yogurt. Avoid fatty cuts of meat, processed or cured meats, and poultry with skin. Fill about one fourth of your plate with lean proteins, such as fish, chicken without skin, beans, eggs, or tofu. Avoid pre-made and processed foods. These tend to be higher in sodium, added sugar, and fat. Reduce your daily sodium intake. Many people with hypertension should eat less than 1,500 mg of sodium a day. Do not drink alcohol if: Your health care provider tells you not to drink. You are pregnant, may be pregnant, or are planning to become pregnant. If you drink alcohol: Limit how much you have to: 0-1 drink a day for women. 0-2 drinks a day for men. Know how much alcohol is in your drink. In the [place] one drink equals one 12 oz bottle of beer (355 mL), one 5 oz glass of wine (148 mL), or one 1 oz glass of hard liquor (44 mL). Lifestyle  Work with your health care provider to maintain a healthy body weight or to lose weight. Ask what an ideal weight is for you. Get at least 30 minutes of exercise that causes your heart to beat faster (aerobic exercise) most days of the week. Activities may include walking, swimming, or biking. Include exercise to strengthen your muscles (resistance exercise), such as Pilates or lifting weights, as part of your weekly exercise routine. Try to do these types of exercises for 30 minutes at least 3 days a week. Do not use any products that contain nicotine or tobacco. These products include cigarettes, chewing tobacco, and vaping devices, such as e-cigarettes. If you need help quitting, ask your health care provider. Monitor your blood pressure at home as told by your health care provider. Keep all follow-up visits. This is important. Medicines Take over-the-counter and prescription medicines only as told by your health care provider. Follow directions carefully. Blood  pressure medicines must be taken as prescribed. Do not skip doses of blood pressure medicine. Doing this puts you at risk for problems and can make the medicine less effective. Ask your health care provider about side effects or reactions to medicines that you should watch for. Contact a health care provider if you: Think you are having a reaction to a medicine you are taking. Have headaches that keep coming back (recurring). Feel dizzy. Have swelling in your ankles. Have trouble with your vision. Get help right away if you: Develop a severe headache or confusion. Have unusual weakness or numbness. Feel faint. Have severe pain in your chest or abdomen. Vomit repeatedly. Have trouble breathing. These symptoms may be an emergency. Get help right away. Call 911. Do not wait to see if the symptoms will go away. Do not drive yourself to the hospital. Summary Hypertension is when the force of blood pumping through your arteries is too strong. If this condition is not controlled, it may put you at risk for serious complications. Your personal target blood pressure may vary depending on your medical conditions, your age, and other factors. For most people, a normal blood pressure is less than 120/80. Hypertension is treated with lifestyle changes, medicines, or a combination of both. Lifestyle changes include losing weight, eating a healthy,   low-sodium diet, exercising more, and limiting alcohol. This information is not intended to replace advice given to you by your health care provider. Make sure you discuss any questions you have with your health care provider. Document Revised: 08/16/2021 Document Reviewed: 08/16/2021 [employer] Patient Education  2024 [employer]  

## 2023-04-11 NOTE — Assessment & Plan Note (Signed)
Stable.  No recent anginal episodes. Continues daily baby aspirin along with beta-blocker

## 2023-04-11 NOTE — Progress Notes (Signed)
[name] [place] 67 y.o.   Chief Complaint  Patient presents with   Medical Management of Chronic Issues    f/u appt, no concerns     HISTORY OF PRESENT ILLNESS: This is a 67 y.o. male A1A here for follow-up of chronic medical conditions including hypertension, congestive heart failure, coronary artery disease, and dyslipidemia Overall doing well.  Has no complaints or medical concerns today.  BP Readings from Last 3 Encounters:  04/11/23 116/78  10/04/22 118/70  04/14/22 122/76   Wt Readings from Last 3 Encounters:  04/11/23 209 lb 6 oz (95 kg)  10/04/22 212 lb (96.2 kg)  04/14/22 208 lb 6.4 oz (94.5 kg)   Most recent cardiologist office visit notes assessment and plan as follows: Assessment / Plan:    1. CAD - s/p CABG, - He had an echocardiogram in December, 2021 which revealed mildly depressed left ventricular systolic function with an EF of 45 to 50%.     2. Chronic systolic CHF-   no CHF signs or symptoms      3. Hyperlipidemia - lipids have been well controlled.  Check labs today        Kristeen Miss, MD  04/14/2022 9:34 AM    [facility] [address] [place],  [address] [place], [place]  [address] Pager [phone] Phone: [phone]; Fax: [phone]   HPI   Prior to Admission medications   Medication Sig Start Date End Date Taking? Authorizing Provider  aspirin EC 81 MG tablet Take 1 tablet (81 mg total) by mouth daily. 08/17/14  Yes Nahser, Deloris Ping, MD  atorvastatin (LIPITOR) 80 MG tablet TAKE ONE TABLET BY MOUTH DAILY IN THE EVENING at 6pm 10/25/22  Yes Nahser, Deloris Ping, MD  carvedilol (COREG) 12.5 MG tablet TAKE ONE TABLET BY MOUTH TWICE DAILY with a meal 10/25/22  Yes Nahser, Deloris Ping, MD  furosemide (LASIX) 40 MG tablet TAKE ONE TABLET BY MOUTH ONE TIME DAILY 10/25/22  Yes Nahser, Deloris Ping, MD  meclizine (ANTIVERT) 25 MG tablet Take 1 tablet (25 mg total) by mouth Once PRN for up to 15 doses for dizziness. 05/04/20  Yes  Carroll Sage, PA-C  Multiple Vitamin (MULTIVITAMIN WITH MINERALS) TABS tablet Take 1 tablet by mouth daily.   Yes [provider]  potassium chloride SA (KLOR-CON M) 20 MEQ tablet TAKE ONE TABLET BY MOUTH ONE TIME DAILY 10/25/22  Yes Nahser, Deloris Ping, MD  ramipril (ALTACE) 5 MG capsule TAKE ONE CAPSULE BY MOUTH ONE TIME DAILY 10/25/22  Yes Nahser, Deloris Ping, MD  spironolactone (ALDACTONE) 25 MG tablet take one-half tablet by mouth daily 10/25/22  Yes Nahser, Deloris Ping, MD    No Known Allergies  Patient Active Problem List   Diagnosis Date Noted   Sensorineural hearing loss (SNHL) of left ear with unrestricted hearing of right ear 05/11/2021   Chronic systolic heart failure (HCC) 08/17/2014   ED (erectile dysfunction) of organic origin 10/21/2013   S/P CABG x 3 10/21/2013   Dyslipidemia 11/26/2007   Essential hypertension 11/26/2007   Coronary atherosclerosis 11/26/2007   Perennial allergic rhinitis with seasonal variation 11/26/2007   Obstructive sleep apnea syndrome 11/26/2007    Past Medical History:  Diagnosis Date   Allergic rhinitis    Allergy    Bradycardia    He has a hx of   Congestive heart failure (HCC)    EF equals 40%   Coronary artery disease    Status post previous  anterior wall myocardial infarction.    Dyslipidemia    He has a hx   GERD (gastroesophageal reflux disease)    History of gastroesophageal reflux (GERD)    Hypercholesterolemia    He has a hx of    Hypertension    Myocardial infarction (HCC) 2008   OSA on CPAP    Sleep apnea    wears c-pap    Past Surgical History:  Procedure Laterality Date   APPENDECTOMY  Age 67   CARDIAC CATHETERIZATION     His EF is 40-45%. This has increased from 30% at the time of  last year(2008-9)   CORONARY ARTERY BYPASS GRAFT     KNEE SURGERY  2006   SHOULDER SURGERY     left   TONSILLECTOMY     US ECHOCARDIOGRAPHY  10-25-2007   Est EF 35-40%    Social History   Socioeconomic History   Marital  status: Married    Spouse name: [name]   Number of children: 1   Years of education: Not on file   Highest education level: Not on file  Occupational History   Occupation: high school custodian  Tobacco Use   Smoking status: Never   Smokeless tobacco: Never  Vaping Use   Vaping Use: Never used  Substance and Sexual Activity   Alcohol use: No    Alcohol/week: 0.0 standard drinks of alcohol    Comment: rare   Drug use: No   Sexual activity: Not Currently  Other Topics Concern   Not on file  Social History Narrative   Not on file   Social Determinants of Health   Financial Resource Strain: Low Risk  (10/03/2022)   Overall Financial Resource Strain (CARDIA)    Difficulty of Paying Living Expenses: Not hard at all  Food Insecurity: No Food Insecurity (10/03/2022)   Hunger Vital Sign    Worried About Running Out of Food in the Last Year: Never true    Ran Out of Food in the Last Year: Never true  Transportation Needs: No Transportation Needs (10/03/2022)   PRAPARE - Administrator, Civil Service (Medical): No    Lack of Transportation (Non-Medical): No  Physical Activity: Insufficiently Active (10/03/2022)   Exercise Vital Sign    Days of Exercise per Week: 3 days    Minutes of Exercise per Session: 40 min  Stress: No Stress Concern Present (10/03/2022)   [employer] of Occupational Health - Occupational Stress Questionnaire    Feeling of Stress : Not at all  Social Connections: Socially Integrated (10/03/2022)   Social Connection and Isolation Panel [NHANES]    Frequency of Communication with Friends and Family: More than three times a week    Frequency of Social Gatherings with Friends and Family: More than three times a week    Attends Religious Services: More than 4 times per year    Active Member of [employer] or Organizations: Yes    Attends Engineer, structural: More than 4 times per year    Marital Status: Married  Catering manager Violence:  Not At Risk (10/03/2022)   Humiliation, Afraid, Rape, and Kick questionnaire    Fear of Current or Ex-Partner: No    Emotionally Abused: No    Physically Abused: No    Sexually Abused: No    Family History  Problem Relation Age of Onset   Heart attack Father    Kidney cancer Mother    Colon cancer Neg Hx    Esophageal  cancer Neg Hx    Pancreatic cancer Neg Hx    Prostate cancer Neg Hx    Rectal cancer Neg Hx    Stomach cancer Neg Hx      Review of Systems  Constitutional: Negative.  Negative for chills and fever.  HENT: Negative.  Negative for congestion and sore throat.   Respiratory: Negative.  Negative for cough and shortness of breath.   Cardiovascular: Negative.  Negative for chest pain and palpitations.  Gastrointestinal: Negative.  Negative for abdominal pain, diarrhea, nausea and vomiting.  Genitourinary: Negative.  Negative for dysuria and hematuria.  Skin: Negative.  Negative for rash.  Neurological: Negative.  Negative for dizziness and headaches.  All other systems reviewed and are negative.   Vitals:   04/11/23 0934  BP: 116/78  Pulse: (!) 56  Temp: 97.6 F (36.4 C)  SpO2: 97%    Physical Exam Vitals reviewed.  Constitutional:      Appearance: Normal appearance.  HENT:     Head: Normocephalic.  Eyes:     Extraocular Movements: Extraocular movements intact.  Cardiovascular:     Rate and Rhythm: Normal rate and regular rhythm.     Pulses: Normal pulses.     Heart sounds: Normal heart sounds.  Pulmonary:     Effort: Pulmonary effort is normal.     Breath sounds: Normal breath sounds.  Abdominal:     Palpations: Abdomen is soft.     Tenderness: There is no abdominal tenderness.  Musculoskeletal:     Cervical back: No tenderness.     Right lower leg: No edema.     Left lower leg: No edema.  Lymphadenopathy:     Cervical: No cervical adenopathy.  Skin:    General: Skin is warm and dry.     Capillary Refill: Capillary refill takes less than 2  seconds.  Neurological:     General: No focal deficit present.     Mental Status: He is alert and oriented to person, place, and time.  Psychiatric:        Mood and Affect: Mood normal.        Behavior: Behavior normal.      ASSESSMENT & PLAN: A total of 42 minutes was spent with the patient and counseling/coordination of care regarding preparing for this visit, review of most recent office visit notes, review of multiple chronic medical conditions and their management, review of all medications, review of most recent blood work results, review of most recent myocardial perfusion imaging report from December 2022, review of most recent cardiologist office visit notes, education on nutrition, prognosis, documentation and need for follow-up.  Problem List Items Addressed This Visit       Cardiovascular and Mediastinum   Essential hypertension - Primary    Well-controlled hypertension Continue ramipril 5 mg daily, Aldactone 12.5 mg daily, furosemide 40 mg daily and carvedilol 12.5 mg twice a day      Relevant Orders   CBC with Differential/Platelet   Comprehensive metabolic panel   Coronary atherosclerosis    Stable.  No recent anginal episodes. Continues daily baby aspirin along with beta-blocker      Chronic systolic heart failure (HCC)    No signs or symptoms of acute congestive heart failure Not retaining fluid.  Euvolemic Wt Readings from Last 3 Encounters:  04/11/23 209 lb 6 oz (95 kg)  10/04/22 212 lb (96.2 kg)  04/14/22 208 lb 6.4 oz (94.5 kg)  Continues furosemide 40 mg daily, ramipril 5 mg daily  and Aldactone 12.5 mg daily Also on carvedilol 12.5 mg twice a day       Relevant Orders   CBC with Differential/Platelet     Respiratory   Obstructive sleep apnea syndrome    Stable.  On CPAP treatment.        Other   Dyslipidemia    Chronic stable condition Continues atorvastatin 80 mg daily Diet and nutrition discussed      Relevant Orders   Comprehensive  metabolic panel   Lipid panel   Patient Instructions  Hypertension, Adult High blood pressure (hypertension) is when the force of blood pumping through the arteries is too strong. The arteries are the blood vessels that carry blood from the heart throughout the body. Hypertension forces the heart to work harder to pump blood and may cause arteries to become narrow or stiff. Untreated or uncontrolled hypertension can lead to a heart attack, heart failure, a stroke, kidney disease, and other problems. A blood pressure reading consists of a higher number over a lower number. Ideally, your blood pressure should be below 120/80. The first ("top") number is called the systolic pressure. It is a measure of the pressure in your arteries as your heart beats. The second ("bottom") number is called the diastolic pressure. It is a measure of the pressure in your arteries as the heart relaxes. What are the causes? The exact cause of this condition is not known. There are some conditions that result in high blood pressure. What increases the risk? Certain factors may make you more likely to develop high blood pressure. Some of these risk factors are under your control, including: Smoking. Not getting enough exercise or physical activity. Being overweight. Having too much fat, sugar, calories, or salt (sodium) in your diet. Drinking too much alcohol. Other risk factors include: Having a personal history of heart disease, diabetes, high cholesterol, or kidney disease. Stress. Having a family history of high blood pressure and high cholesterol. Having obstructive sleep apnea. Age. The risk increases with age. What are the signs or symptoms? High blood pressure may not cause symptoms. Very high blood pressure (hypertensive crisis) may cause: Headache. Fast or irregular heartbeats (palpitations). Shortness of breath. Nosebleed. Nausea and vomiting. Vision changes. Severe chest pain, dizziness, and  seizures. How is this diagnosed? This condition is diagnosed by measuring your blood pressure while you are seated, with your arm resting on a flat surface, your legs uncrossed, and your feet flat on the floor. The cuff of the blood pressure monitor will be placed directly against the skin of your upper arm at the level of your heart. Blood pressure should be measured at least twice using the same arm. Certain conditions can cause a difference in blood pressure between your right and left arms. If you have a high blood pressure reading during one visit or you have normal blood pressure with other risk factors, you may be asked to: Return on a different day to have your blood pressure checked again. Monitor your blood pressure at home for 1 week or longer. If you are diagnosed with hypertension, you may have other blood or imaging tests to help your health care provider understand your overall risk for other conditions. How is this treated? This condition is treated by making healthy lifestyle changes, such as eating healthy foods, exercising more, and reducing your alcohol intake. You may be referred for counseling on a healthy diet and physical activity. Your health care provider may prescribe medicine if lifestyle changes are  not enough to get your blood pressure under control and if: Your systolic blood pressure is above 130. Your diastolic blood pressure is above 80. Your personal target blood pressure may vary depending on your medical conditions, your age, and other factors. Follow these instructions at home: Eating and drinking  Eat a diet that is high in fiber and potassium, and low in sodium, added sugar, and fat. An example of this eating plan is called the DASH diet. DASH stands for Dietary Approaches to Stop Hypertension. To eat this way: Eat plenty of fresh fruits and vegetables. Try to fill one half of your plate at each meal with fruits and vegetables. Eat whole grains, such as  whole-wheat pasta, brown rice, or whole-grain bread. Fill about one fourth of your plate with whole grains. Eat or drink low-fat dairy products, such as skim milk or low-fat yogurt. Avoid fatty cuts of meat, processed or cured meats, and poultry with skin. Fill about one fourth of your plate with lean proteins, such as fish, chicken without skin, beans, eggs, or tofu. Avoid pre-made and processed foods. These tend to be higher in sodium, added sugar, and fat. Reduce your daily sodium intake. Many people with hypertension should eat less than 1,500 mg of sodium a day. Do not drink alcohol if: Your health care provider tells you not to drink. You are pregnant, may be pregnant, or are planning to become pregnant. If you drink alcohol: Limit how much you have to: 0-1 drink a day for women. 0-2 drinks a day for men. Know how much alcohol is in your drink. In the [place] one drink equals one 12 oz bottle of beer (355 mL), one 5 oz glass of wine (148 mL), or one 1 oz glass of hard liquor (44 mL). Lifestyle  Work with your health care provider to maintain a healthy body weight or to lose weight. Ask what an ideal weight is for you. Get at least 30 minutes of exercise that causes your heart to beat faster (aerobic exercise) most days of the week. Activities may include walking, swimming, or biking. Include exercise to strengthen your muscles (resistance exercise), such as Pilates or lifting weights, as part of your weekly exercise routine. Try to do these types of exercises for 30 minutes at least 3 days a week. Do not use any products that contain nicotine or tobacco. These products include cigarettes, chewing tobacco, and vaping devices, such as e-cigarettes. If you need help quitting, ask your health care provider. Monitor your blood pressure at home as told by your health care provider. Keep all follow-up visits. This is important. Medicines Take over-the-counter and prescription medicines only as  told by your health care provider. Follow directions carefully. Blood pressure medicines must be taken as prescribed. Do not skip doses of blood pressure medicine. Doing this puts you at risk for problems and can make the medicine less effective. Ask your health care provider about side effects or reactions to medicines that you should watch for. Contact a health care provider if you: Think you are having a reaction to a medicine you are taking. Have headaches that keep coming back (recurring). Feel dizzy. Have swelling in your ankles. Have trouble with your vision. Get help right away if you: Develop a severe headache or confusion. Have unusual weakness or numbness. Feel faint. Have severe pain in your chest or abdomen. Vomit repeatedly. Have trouble breathing. These symptoms may be an emergency. Get help right away. Call 911. Do not wait  to see if the symptoms will go away. Do not drive yourself to the hospital. Summary Hypertension is when the force of blood pumping through your arteries is too strong. If this condition is not controlled, it may put you at risk for serious complications. Your personal target blood pressure may vary depending on your medical conditions, your age, and other factors. For most people, a normal blood pressure is less than 120/80. Hypertension is treated with lifestyle changes, medicines, or a combination of both. Lifestyle changes include losing weight, eating a healthy, low-sodium diet, exercising more, and limiting alcohol. This information is not intended to replace advice given to you by your health care provider. Make sure you discuss any questions you have with your health care provider. Document Revised: 08/16/2021 Document Reviewed: 08/16/2021 Elsevier Patient Education  2024 Elsevier Inc.     Edwina Barth, MD [facility]

## 2023-04-11 NOTE — Assessment & Plan Note (Signed)
Well-controlled hypertension Continue ramipril 5 mg daily, Aldactone 12.5 mg daily, furosemide 40 mg daily and carvedilol 12.5 mg twice a day

## 2023-04-11 NOTE — Assessment & Plan Note (Signed)
Chronic stable condition Continues atorvastatin 80 mg daily Diet and nutrition discussed

## 2023-04-14 NOTE — Progress Notes (Signed)
HPI male never smoker followed for OSA, allergic rhinitis, complicated by HBP, CAD/ MI, CHF, GERD NPSG 02/22/11- AHI 24.2/ hr, desatutration to 86%, CPAP to 9, body weight 193 lbs  ----------------------------------------------------------------------------------   03/2222- 66 yoM never smoker followed for OSA, Allergic Rhinitis, complicated by HTN, CAD/MI/ CABG, sCHF, Gr 2 DD, Hyperlipidemia, GERD, Vertigo(ENT), CPAP auto 5-15/ Julaine Fusi his SD card Body weight today- Covid vax-3 Moderna  He reports being very comfortable and sleeping quite well with his CPAP machine.  He reports it is mechanically working fine with no concerns. Denies any problems with his breathing and denies any recent acute issues with his heart problems.  He has been evaluated recently for vertigo and I suggested he might try OTC meclizine.  04/16/23- 67 yoM never smoker followed for OSA, Allergic Rhinitis, complicated by HTN, CAD/MI/ CABG, sCHF, Gr 2 DD, Hyperlipidemia, GERD, Vertigo(ENT), CPAP auto 5-15/ [employer]-compliance- Body weight today-  ROS-see HPI + = positive Constitutional:   No-   weight loss, night sweats, fevers, chills, fatigue, lassitude. HEENT:   No-  headaches, difficulty swallowing, tooth/dental problems, sore throat,       No-  sneezing, itching, ear ache, + nasal congestion, post nasal drip,  CV:  No-   chest pain, orthopnea, PND, swelling in lower extremities, anasarca,            dizziness, palpitations Resp: No-   shortness of breath with exertion or at rest.              No-   productive cough,  No non-productive cough,  No- coughing up of blood.              No-   change in color of mucus.  No- wheezing.   Skin: No-   rash or lesions. GI:  No-   heartburn, indigestion, abdominal pain, nausea, vomiting,  GU:  MS:  No-   joint pain or swelling.  Neuro-     +vertigo Psych:  No- change in mood or affect. No depression or anxiety.  No memory loss.  OBJ- Physical Exam    stable baseline exam General- Alert, Oriented, Affect-appropriate, Distress- none acute, +stocky build Skin- rash-none, lesions- none, excoriation- none Lymphadenopathy- none Head- atraumatic            Eyes- Gross vision intact, PERRLA, conjunctivae and secretions clear            Ears- Hearing, canals-normal            Nose- Clear, no-Septal dev, mucus, polyps, erosion, perforation             Throat- Mallampati IV , mucosa clear , drainage- none, tonsils- atrophic Neck- flexible , trachea midline, no stridor , thyroid nl, carotid no bruit Chest - symmetrical excursion , unlabored           Heart/CV- RRR , no murmur , no gallop  , no rub, nl s1 s2                           - JVD- none , edema- none, stasis changes- none, varices- none           Lung- clear to P&A, wheeze- none, cough- none , dullness-none, rub- none           Chest wall-  Abd-  Br/ Gen/ Rectal- Not done, not indicated Extrem- cyanosis- none, clubbing, none, atrophy- none, strength- nl Neuro- grossly intact to observation,  no nystagmus

## 2023-04-16 ENCOUNTER — Encounter: Payer: Self-pay | Admitting: Internal Medicine

## 2023-04-16 ENCOUNTER — Ambulatory Visit: Payer: Medicare PPO | Admitting: Internal Medicine

## 2023-04-16 ENCOUNTER — Other Ambulatory Visit: Payer: Self-pay | Admitting: Cardiovascular Disease

## 2023-04-16 VITALS — BP 118/66 | HR 53 | Temp 98.0°F | Ht 66.0 in | Wt 209.6 lb

## 2023-04-16 DIAGNOSIS — I2510 Atherosclerotic heart disease of native coronary artery without angina pectoris: Secondary | ICD-10-CM | POA: Diagnosis not present

## 2023-04-16 DIAGNOSIS — G4733 Obstructive sleep apnea (adult) (pediatric): Secondary | ICD-10-CM | POA: Diagnosis not present

## 2023-04-16 NOTE — Assessment & Plan Note (Signed)
Benefits from CPAP and continues to use every night. Plan- continue auto 5-15

## 2023-04-16 NOTE — Patient Instructions (Signed)
We can continue CPAP auto 5-15 ° °Please call if we can help °

## 2023-04-16 NOTE — Assessment & Plan Note (Signed)
Continues managed by Cardiology

## 2023-04-22 ENCOUNTER — Encounter: Payer: Self-pay | Admitting: Cardiovascular Disease

## 2023-04-22 NOTE — Progress Notes (Unsigned)
[name] Date of Birth  [birth year] [facility] [address]    [address] [place], [place]  [address] [phone]  Fax  [phone]   Problems: 1. CAD - s/p CABG, 2. Chronic systolic CHF 3. Hyperlipidemia   [name]  Is a 67 y.o. gentleman with a history of coronary artery disease. Status post coronary artery bypass grafting. He has a history of congestive heart failure. His most recent ejection fraction was around 40% - 45%.  He denies episodes of chest pain or shortness of breath. He has had some episodes of dizziness that have occurred in the middle of the night. They resolved spontaneously.  Oct. 24, 2014:  [name] is doing well.  He has been having episodes of sneezing when he eats as he get full . No CP , no dyspnea.  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 26, 2015:  [name] is doing well.  He still quite busy working at [employer]. He's not having episodes of chest pain or shortness of breath.  He sneezes when he eats.  He has not had any chest pain or dyspnea.   Oct. 28, 2016:  [name] is feeling well.  Following his last visit we performed an echo card gram that showed that his left ventricular systolic function had decreased slightly to 30-35%.   We added Aldactone 12. 5 mg a day. Still sneezing while he is eating/when he is full. He's not having quite as much shortness of breath. His wife states that he seems to be breathing more comfortably at night.   Jan. 10, 2017: Doing about the same. We increased Altace during the last visit.  Has not noticed any significant difference.   December 27, 2015: [name] is doing well. His echo shows improvement of his LV function .  He needs to have left knee surgery  Also needs to have a colonoscopy   He is at low risk for both procedures.   Nov. 3, 2017:  [name] is doing well today , seen with wife [name]  Echocardiogram from 12/23/2015  shows slightly improved left ventricle systolic function to an ejection fraction of 40%. He has grade 2 diastolic dysfunction.  Larey Seat and broke his shoulder in May .  Doing well , very active, no CP or dyspnea  Has been active , works out at [employer] [name] had gastric bypass   Feb 23, 2017:  Has had knee surgery since I last saw him. No cardiac complications  No CP or dyspnea.    Dec. 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 Is not walking as much.  He retired in June   Sep 13, 2020 [name] is seen today for follow up of his CAD, CHF,  ( seen with wife, [name] )  Had some dizziness / vertigo in July. Symptoms are c/w vertigo  - room spinning with twisting his head )  Not much exercise now.    [name] has noticed some DOE  Will get an echo   Nov. 22, 2022 [name] is seen for follow up of his CHF and CAD  Seen with wife  [name]  No CP or dyspnea.  Echo from 12/21 shows EF 45-50% Is active.  Not as much exercise as he should   April 14, 2022  [name] is seen today for follow up of his CAD, CHF Not much exercise during the winter Retired 3 years ago  Still eating bacon and ham on occasion   Has some DOE.    April 23, 2023 [name] is seen for follow up of his CHF, CAD Doing well No CP , breathing is good  Not much exercise    We discussed the benefits of regular exercise     Current Outpatient Medications on File Prior to Visit  Medication Sig Dispense Refill   aspirin EC 81 MG tablet Take 1 tablet (81 mg total) by mouth daily.     atorvastatin (LIPITOR) 80 MG tablet Take 1 tablet by mouth daily in the evening at 6 PM 90 tablet 0   carvedilol (COREG) 12.5 MG tablet Take 1 tablet by mouth twice daily with a meal 180 tablet 0   furosemide  (LASIX) 40 MG tablet TAKE ONE TABLET BY MOUTH ONE TIME DAILY 90 tablet 0   meclizine (ANTIVERT) 25 MG tablet Take 1 tablet (25 mg total) by mouth Once PRN for up to 15 doses for dizziness. 15 tablet 0   Multiple Vitamin (MULTIVITAMIN WITH MINERALS) TABS tablet Take 1 tablet by mouth daily.     potassium chloride SA (KLOR-CON M) 20 MEQ tablet TAKE ONE TABLET BY MOUTH ONE TIME DAILY 90 tablet 0   ramipril (ALTACE) 5 MG capsule TAKE ONE CAPSULE BY MOUTH ONE TIME DAILY 90 capsule 0   spironolactone (ALDACTONE) 25 MG tablet Take one-half tablet by mouth daily 45 tablet 0   No current facility-administered medications on file prior to visit.    No Known Allergies  Past Medical History:  Diagnosis Date   Allergic rhinitis    Allergy    Bradycardia    He has a hx of   Congestive heart failure (HCC)    EF equals 40%   Coronary artery disease    Status post previous anterior wall myocardial infarction.    Dyslipidemia    He has a hx   GERD (gastroesophageal reflux disease)    History of gastroesophageal reflux (GERD)    Hypercholesterolemia    He has a hx of    Hypertension    Myocardial infarction (HCC) 2008   OSA on CPAP    Sleep apnea    wears c-pap    Past Surgical History:  Procedure Laterality Date   APPENDECTOMY  Age 66   CARDIAC CATHETERIZATION     His EF is 40-45%. This has increased from 30% at the time of  last year(2008-9)   CORONARY ARTERY BYPASS GRAFT     KNEE SURGERY  2006   SHOULDER SURGERY     left   TONSILLECTOMY     US ECHOCARDIOGRAPHY  10-25-2007   Est EF 35-40%    Social History   Tobacco Use  Smoking Status Never  Smokeless Tobacco Never    Social History   Substance and Sexual Activity  Alcohol Use No   Alcohol/week: 0.0 standard drinks of alcohol   Comment: rare    Family History  Problem Relation Age of Onset   Heart attack Father    Kidney cancer Mother    Colon cancer Neg Hx    Esophageal cancer Neg Hx    Pancreatic cancer Neg Hx  Prostate cancer Neg Hx    Rectal cancer Neg Hx    Stomach cancer Neg Hx     Reviw of Systems:  Reviewed in the HPI.  All other systems are negative.   Physical Exam: Blood pressure 122/78, pulse (!) 57, height 5\' 6"  (1.676 m), weight 211 lb (95.7 kg), SpO2 96 %.       GEN:  Well nourished, well developed in no acute distress HEENT: Normal NECK: No JVD; No carotid bruits LYMPHATICS: No lymphadenopathy CARDIAC: RRR ,  extensive keloid scaring of his  sternotomy   RESPIRATORY:  Clear to auscultation without rales, wheezing or rhonchi  ABDOMEN: Soft, non-tender, non-distended MUSCULOSKELETAL:  No edema; No deformity  SKIN: Warm and dry NEUROLOGIC:  Alert and oriented x 3     ECG:      Assessment / Plan:   1. CAD - s/p CABG, -   no angina .   Advised more exercise   2. Chronic systolic CHF-    cont current meds .  Well controlled at this time .    3. Hyperlipidemia - LDL is 54.   Cont current meds.      Kristeen Miss, MD  04/23/2023 9:19 AM    [facility] [address] [place],  [address] [place], [place]  [address] Pager [phone] Phone: [phone]; Fax: [phone]

## 2023-04-23 ENCOUNTER — Ambulatory Visit: Payer: Medicare PPO | Attending: Cardiovascular Disease | Admitting: Cardiovascular Disease

## 2023-04-23 ENCOUNTER — Encounter: Payer: Self-pay | Admitting: Cardiovascular Disease

## 2023-04-23 VITALS — BP 122/78 | HR 57 | Ht 66.0 in | Wt 211.0 lb

## 2023-04-23 DIAGNOSIS — I10 Essential (primary) hypertension: Secondary | ICD-10-CM

## 2023-04-23 DIAGNOSIS — I5022 Chronic systolic (congestive) heart failure: Secondary | ICD-10-CM

## 2023-04-23 DIAGNOSIS — I2510 Atherosclerotic heart disease of native coronary artery without angina pectoris: Secondary | ICD-10-CM | POA: Diagnosis not present

## 2023-04-23 NOTE — Patient Instructions (Signed)
Medication Instructions:  Your physician recommends that you continue on your current medications as directed. Please refer to the Current Medication list given to you today.  *If you need a refill on your cardiac medications before your next appointment, please call your pharmacy*   Lab Work: None  If you have labs (blood work) drawn today and your tests are completely normal, you will receive your results only by: MyChart Message (if you have MyChart) OR A paper copy in the mail If you have any lab test that is abnormal or we need to change your treatment, we will call you to review the results.   Testing/Procedures: None   Follow-Up: At [facility], you and your health needs are our priority.  As part of our continuing mission to provide you with exceptional heart care, we have created designated Provider Care Teams.  These Care Teams include your primary Cardiologist (physician) and Advanced Practice Providers (APPs -  Physician Assistants and Nurse Practitioners) who all work together to provide you with the care you need, when you need it.  We recommend signing up for the patient portal called "MyChart".  Sign up information is provided on this After Visit Summary.  MyChart is used to connect with patients for Virtual Visits (Telemedicine).  Patients are able to view lab/test results, encounter notes, upcoming appointments, etc.  Non-urgent messages can be sent to your provider as well.   To learn more about what you can do with MyChart, go to [email or web address].    Your next appointment:   1 year(s)  Provider:   Kristeen Miss, MD     Other Instructions None

## 2023-06-12 DIAGNOSIS — G4733 Obstructive sleep apnea (adult) (pediatric): Secondary | ICD-10-CM | POA: Diagnosis not present

## 2023-07-26 ENCOUNTER — Other Ambulatory Visit: Payer: Self-pay | Admitting: Cardiovascular Disease

## 2023-08-10 DIAGNOSIS — M25561 Pain in right knee: Secondary | ICD-10-CM | POA: Diagnosis not present

## 2023-09-12 DIAGNOSIS — G4733 Obstructive sleep apnea (adult) (pediatric): Secondary | ICD-10-CM | POA: Diagnosis not present

## 2023-10-08 ENCOUNTER — Ambulatory Visit (INDEPENDENT_AMBULATORY_CARE_PROVIDER_SITE_OTHER): Payer: Medicare PPO

## 2023-10-08 VITALS — Ht 66.0 in | Wt 211.0 lb

## 2023-10-08 DIAGNOSIS — Z0000 Encounter for general adult medical examination without abnormal findings: Secondary | ICD-10-CM | POA: Diagnosis not present

## 2023-10-08 NOTE — Patient Instructions (Addendum)
[name] , Thank you for taking time to come for your Medicare Wellness Visit. I appreciate your ongoing commitment to your health goals. Please review the following plan we discussed and let me know if I can assist you in the future.   Referrals/Orders/Follow-Ups/Clinician Recommendations: You are due for a PPSV23, which is your 2nd Pneumonia vaccine.  It was nice talking to you and hope you and your family have a very merry Christmas.  This is a list of the screening recommended for you and due dates:  Health Maintenance  Topic Date Due   Hepatitis C Screening  Never done   Pneumonia Vaccine (2 of 2 - PCV) 09/28/2017   COVID-19 Vaccine (4 - 2024-25 season) 10/24/2023*   Medicare Annual Wellness Visit  10/07/2024   Colon Cancer Screening  01/17/2026   DTaP/Tdap/Td vaccine (2 - Td or Tdap) 04/04/2032   Flu Shot  Completed   Zoster (Shingles) Vaccine  Completed   HPV Vaccine  Aged Out  *Topic was postponed. The date shown is not the original due date.    Advanced directives: (Copy Requested) Please bring a copy of your health care power of attorney and living will to the office to be added to your chart at your convenience.  Next Medicare Annual Wellness Visit scheduled for next year: Yes

## 2023-10-08 NOTE — Progress Notes (Signed)
Subjective:   [name] is a 67 y.o. male who presents for Medicare Annual/Subsequent preventive examination.  Visit Complete: Virtual I connected with  [name] on 10/08/23 by a audio enabled telemedicine application and verified that I am speaking with the correct person using two identifiers.  Patient Location: Home  Provider Location: Home Office  I discussed the limitations of evaluation and management by telemedicine. The patient expressed understanding and agreed to proceed.  Vital Signs: Because this visit was a virtual/telehealth visit, some criteria may be missing or patient reported. Any vitals not documented were not able to be obtained and vitals that have been documented are patient reported.   Cardiac Risk Factors include: advanced age (>54men, >23 women);male gender;hypertension;dyslipidemia;Other (see comment), Risk factor comments: OSA, Coronary atherosclerosis, CHF     Objective:    Today's Vitals   10/08/23 1030  Weight: 211 lb (95.7 kg)  Height: 5\' 6"  (1.676 m)   Body mass index is 34.06 kg/m.     10/08/2023   10:34 AM 10/03/2022    2:58 PM 05/04/2020    6:08 PM 03/13/2016   11:42 AM 12/02/2015    2:34 PM 03/05/2015    9:19 AM  Advanced Directives  Does Patient Have a Medical Advance Directive? Yes Yes No No No No  Type of Estate agent of [place];Living will Healthcare Power of [place];Living will      Does patient want to make changes to medical advance directive?  No - Patient declined      Copy of Healthcare Power of Attorney in Chart? No - copy requested No - copy requested      Would patient like information on creating a medical advance directive?   No - Patient declined  No - patient declined information     Current Medications (verified) Outpatient Encounter Medications as of 10/08/2023  Medication Sig   aspirin EC 81 MG tablet Take 1 tablet (81 mg total) by mouth daily.   atorvastatin (LIPITOR) 80 MG  tablet Take 1 tablet by mouth daily in the evening at 6 PM   carvedilol (COREG) 12.5 MG tablet Take 1 tablet by mouth twice daily with a meal   furosemide (LASIX) 40 MG tablet TAKE ONE TABLET BY MOUTH ONCE A DAY   meclizine (ANTIVERT) 25 MG tablet Take 1 tablet (25 mg total) by mouth Once PRN for up to 15 doses for dizziness.   Multiple Vitamin (MULTIVITAMIN WITH MINERALS) TABS tablet Take 1 tablet by mouth daily.   potassium chloride SA (KLOR-CON M) 20 MEQ tablet TAKE ONE TABLET BY MOUTH ONCE A DAY   ramipril (ALTACE) 5 MG capsule TAKE ONE CAPSULE BY MOUTH ONCE A DAY   spironolactone (ALDACTONE) 25 MG tablet Take one-half tablet by mouth daily   No facility-administered encounter medications on file as of 10/08/2023.    Allergies (verified) Patient has no known allergies.   History: Past Medical History:  Diagnosis Date   Allergic rhinitis    Allergy    Bradycardia    He has a hx of   Congestive heart failure (HCC)    EF equals 40%   Coronary artery disease    Status post previous anterior wall myocardial infarction.    Dyslipidemia    He has a hx   GERD (gastroesophageal reflux disease)    History of gastroesophageal reflux (GERD)    Hypercholesterolemia    He has a hx of    Hypertension    Myocardial infarction (HCC)  2008   OSA on CPAP    Sleep apnea    wears c-pap   Past Surgical History:  Procedure Laterality Date   APPENDECTOMY  Age 63   CARDIAC CATHETERIZATION     His EF is 40-45%. This has increased from 30% at the time of  last year(2008-9)   CORONARY ARTERY BYPASS GRAFT     KNEE SURGERY  2006   SHOULDER SURGERY     left   TONSILLECTOMY     US ECHOCARDIOGRAPHY  10-25-2007   Est EF 35-40%   Family History  Problem Relation Age of Onset   Heart attack Father    Kidney cancer Mother    Colon cancer Neg Hx    Esophageal cancer Neg Hx    Pancreatic cancer Neg Hx    Prostate cancer Neg Hx    Rectal cancer Neg Hx    Stomach cancer Neg Hx    Social History    Socioeconomic History   Marital status: Married    Spouse name: [name]   Number of children: 1   Years of education: Not on file   Highest education level: Not on file  Occupational History   Occupation: Retired/ high school custodian  Tobacco Use   Smoking status: Never   Smokeless tobacco: Never  Vaping Use   Vaping status: Never Used  Substance and Sexual Activity   Alcohol use: No    Alcohol/week: 0.0 standard drinks of alcohol    Comment: rare   Drug use: No   Sexual activity: Not Currently  Other Topics Concern   Not on file  Social History Narrative   Lives with wife   Social Drivers of Health   Financial Resource Strain: Low Risk  (10/08/2023)   Overall Financial Resource Strain (CARDIA)    Difficulty of Paying Living Expenses: Not hard at all  Food Insecurity: No Food Insecurity (10/08/2023)   Hunger Vital Sign    Worried About Running Out of Food in the Last Year: Never true    Ran Out of Food in the Last Year: Never true  Transportation Needs: No Transportation Needs (10/08/2023)   PRAPARE - Administrator, Civil Service (Medical): No    Lack of Transportation (Non-Medical): No  Physical Activity: Inactive (10/08/2023)   Exercise Vital Sign    Days of Exercise per Week: 0 days    Minutes of Exercise per Session: 0 min  Stress: No Stress Concern Present (10/08/2023)   [employer] of Occupational Health - Occupational Stress Questionnaire    Feeling of Stress : Not at all  Social Connections: Moderately Integrated (10/08/2023)   Social Connection and Isolation Panel [NHANES]    Frequency of Communication with Friends and Family: Twice a week    Frequency of Social Gatherings with Friends and Family: Three times a week    Attends Religious Services: More than 4 times per year    Active Member of Clubs or Organizations: No    Attends Banker Meetings: Never    Marital Status: Married    Tobacco Counseling Counseling  given: Not Answered   Clinical Intake:  Pre-visit preparation completed: Yes  Pain : No/denies pain     BMI - recorded: 34.06 Nutritional Status: BMI > 30  Obese Nutritional Risks: None Diabetes: No  How often do you need to have someone help you when you read instructions, pamphlets, or other written materials from your doctor or pharmacy?: 1 - Never  Interpreter Needed?: No  Activities of Daily Living    10/08/2023   10:33 AM  In your present state of health, do you have any difficulty performing the following activities:  Hearing? 0  Vision? 0  Difficulty concentrating or making decisions? 0  Walking or climbing stairs? 0  Dressing or bathing? 0  Doing errands, shopping? 0  Preparing Food and eating ? N  Using the Toilet? N  In the past six months, have you accidently leaked urine? N  Do you have problems with loss of bowel control? N  Managing your Medications? N  Managing your Finances? N  Housekeeping or managing your Housekeeping? N    Patient Care Team: Georgina Quint, MD as PCP - General (Internal Medicine) Nahser, Deloris Ping, MD as PCP - Cardiology (Cardiology)  Indicate any recent Medical Services you may have received from other than [facility] providers in the past year (date may be approximate).     Assessment:   This is a routine wellness examination for [name].  Hearing/Vision screen Hearing Screening - Comments:: Denies hearing difficulties   Vision Screening - Comments:: Denies vision issues   Goals Addressed             This Visit's Progress    Patient Stated   On track    Increase my physical activity by walking more routinely.      Depression Screen    10/08/2023   10:37 AM 04/11/2023    9:34 AM 10/04/2022    8:07 AM 10/03/2022    2:54 PM 04/04/2022    8:03 AM 09/28/2021    8:21 AM  PHQ 2/9 Scores  PHQ - 2 Score 0 0 0 0 0 0  PHQ- 9 Score 0  0       Fall Risk    10/08/2023   10:35 AM 04/11/2023    9:34 AM  10/04/2022    8:07 AM 10/03/2022    2:59 PM 04/04/2022    8:03 AM  Fall Risk   Falls in the past year? 0 0 0 0 0  Number falls in past yr: 0 0 0 0 0  Injury with Fall? 0 0 0 0 0  Risk for fall due to : No Fall Risks No Fall Risks No Fall Risks No Fall Risks   Follow up Falls prevention discussed;Falls evaluation completed Falls evaluation completed Falls evaluation completed Falls evaluation completed     MEDICARE RISK AT HOME: Medicare Risk at Home Any stairs in or around the home?: Yes If so, are there any without handrails?: Yes Home free of loose throw rugs in walkways, pet beds, electrical cords, etc?: Yes Adequate lighting in your home to reduce risk of falls?: Yes Life alert?: No Use of a cane, walker or w/c?: No Grab bars in the bathroom?: No Shower chair or bench in shower?: No Elevated toilet seat or a handicapped toilet?: No  TIMED UP AND GO:  Was the test performed?  No    Cognitive Function:        10/08/2023   11:01 AM 10/03/2022    3:00 PM  6CIT Screen  What Year? 0 points 0 points  What month? 0 points 0 points  What time? 0 points 0 points  Count back from 20 0 points 0 points  Months in reverse 0 points 0 points  Repeat phrase 0 points 4 points  Total Score 0 points 4 points    Immunizations Immunization History  Administered Date(s) Administered   Fluad Quad(high Dose  65+) 08/25/2022, 08/27/2023   Influenza Split 07/24/2011, 07/23/2013, 07/23/2014, 07/24/2015, 07/23/2016, 07/23/2017, 08/23/2020   Influenza, Seasonal, Injecte, Preservative Fre 07/24/2011, 07/23/2013, 07/24/2015   Influenza,inj,Quad PF,6+ Mos 07/23/2014, 07/23/2016, 08/21/2018, 08/27/2019, 08/30/2020   Influenza-Unspecified 07/24/2011, 07/23/2013, 07/23/2014, 07/24/2015, 07/23/2016, 07/23/2016, 07/23/2017, 07/23/2021   Moderna Covid-19 Vaccine Bivalent Booster 70yrs & up 08/01/2022   Moderna Sars-Covid-2 Vaccination 01/26/2020, 03/15/2020   Pneumococcal Polysaccharide-23  09/28/2016, 09/28/2016   Tdap 04/04/2022   Zoster Recombinant(Shingrix) 09/28/2021, 12/28/2021    TDAP status: Up to date  Flu Vaccine status: Up to date  Pneumococcal vaccine status: Due, Education has been provided regarding the importance of this vaccine. Advised may receive this vaccine at local pharmacy or Health Dept. Aware to provide a copy of the vaccination record if obtained from local pharmacy or Health Dept. Verbalized acceptance and understanding.  Covid-19 vaccine status: Declined, Education has been provided regarding the importance of this vaccine but patient still declined. Advised may receive this vaccine at local pharmacy or Health Dept.or vaccine clinic. Aware to provide a copy of the vaccination record if obtained from local pharmacy or Health Dept. Verbalized acceptance and understanding.  Qualifies for Shingles Vaccine? Yes   Zostavax completed Yes   Shingrix Completed?: Yes  Screening Tests Health Maintenance  Topic Date Due   Hepatitis C Screening  Never done   Pneumonia Vaccine 72+ Years old (2 of 2 - PCV) 09/28/2017   COVID-19 Vaccine (4 - 2024-25 season) 10/24/2023 (Originally 06/24/2023)   Medicare Annual Wellness (AWV)  10/07/2024   Colonoscopy  01/17/2026   DTaP/Tdap/Td (2 - Td or Tdap) 04/04/2032   INFLUENZA VACCINE  Completed   Zoster Vaccines- Shingrix  Completed   HPV VACCINES  Aged Out    Health Maintenance  Health Maintenance Due  Topic Date Due   Hepatitis C Screening  Never done   Pneumonia Vaccine 33+ Years old (2 of 2 - PCV) 09/28/2017    Colorectal cancer screening: Type of screening: Colonoscopy. Completed 01/18/2016. Repeat every 10 years  Lung Cancer Screening: (Low Dose CT Chest recommended if Age 27-80 years, 20 pack-year currently smoking OR have quit w/in 15years.) does not qualify.   Lung Cancer Screening Referral: N/A  Additional Screening:  Hepatitis C Screening: does qualify;   Vision Screening: Recommended annual  ophthalmology exams for early detection of glaucoma and other disorders of the eye. Is the patient up to date with their annual eye exam?  Yes  Who is the provider or what is the name of the office in which the patient attends annual eye exams? [facility] If pt is not established with a provider, would they like to be referred to a provider to establish care? No .   Dental Screening: Recommended annual dental exams for proper oral hygiene   Community Resource Referral / Chronic Care Management: CRR required this visit?  No   CCM required this visit?  No     Plan:     I have personally reviewed and noted the following in the patient's chart:   Medical and social history Use of alcohol, tobacco or illicit drugs  Current medications and supplements including opioid prescriptions. Patient is not currently taking opioid prescriptions. Functional ability and status Nutritional status Physical activity Advanced directives List of other physicians Hospitalizations, surgeries, and ER visits in previous 12 months Vitals Screenings to include cognitive, depression, and falls Referrals and appointments  In addition, I have reviewed and discussed with patient certain preventive protocols, quality metrics, and best practice recommendations.  A written personalized care plan for preventive services as well as general preventive health recommendations were provided to patient.     Aman Batley L Kayline Sheer, CMA   10/08/2023   After Visit Summary: (Mail) Due to this being a telephonic visit, the after visit summary with patients personalized plan was offered to patient via mail   Nurse Notes: Patient is due for a a Pneumonia 23 vaccine and  a Hep C screening.  Patient had no other concerns to address today.

## 2023-10-11 ENCOUNTER — Encounter: Payer: Self-pay | Admitting: Emergency Medicine

## 2023-10-11 ENCOUNTER — Ambulatory Visit: Payer: Medicare PPO | Admitting: Emergency Medicine

## 2023-10-11 VITALS — BP 120/80 | HR 62 | Temp 98.4°F | Ht 66.0 in | Wt 212.0 lb

## 2023-10-11 DIAGNOSIS — G4733 Obstructive sleep apnea (adult) (pediatric): Secondary | ICD-10-CM | POA: Diagnosis not present

## 2023-10-11 DIAGNOSIS — I5022 Chronic systolic (congestive) heart failure: Secondary | ICD-10-CM | POA: Diagnosis not present

## 2023-10-11 DIAGNOSIS — E785 Hyperlipidemia, unspecified: Secondary | ICD-10-CM

## 2023-10-11 DIAGNOSIS — I10 Essential (primary) hypertension: Secondary | ICD-10-CM | POA: Diagnosis not present

## 2023-10-11 NOTE — Progress Notes (Signed)
Hilbert [place] 67 y.o.   Chief Complaint  Patient presents with   Follow-up    6 month f/u. Patient woke up this morning with allergy or maybe sinus cold he not sure. Itchy eyes and runny nose. Patient was told by CMA Brendell to tell us that he is seeing Dr. Rubye Oaks for his eyes     HISTORY OF PRESENT ILLNESS: This is a 67 y.o. male here for 40-month follow-up of chronic medical conditions including hypertension and dyslipidemia Just started with cold symptoms today Overall doing well.  Has no other complaints or medical concerns today. BP Readings from Last 3 Encounters:  10/11/23 120/80  04/23/23 122/78  04/16/23 118/66   Wt Readings from Last 3 Encounters:  10/11/23 212 lb (96.2 kg)  10/08/23 211 lb (95.7 kg)  04/23/23 211 lb (95.7 kg)     HPI   Prior to Admission medications   Medication Sig Start Date End Date Taking? Authorizing Provider  aspirin EC 81 MG tablet Take 1 tablet (81 mg total) by mouth daily. 08/17/14  Yes Nahser, Deloris Ping, MD  atorvastatin (LIPITOR) 80 MG tablet Take 1 tablet by mouth daily in the evening at 6 PM 07/26/23  Yes Nahser, Deloris Ping, MD  carvedilol (COREG) 12.5 MG tablet Take 1 tablet by mouth twice daily with a meal 07/26/23  Yes Nahser, Deloris Ping, MD  furosemide (LASIX) 40 MG tablet TAKE ONE TABLET BY MOUTH ONCE A DAY 07/26/23  Yes Nahser, Deloris Ping, MD  meclizine (ANTIVERT) 25 MG tablet Take 1 tablet (25 mg total) by mouth Once PRN for up to 15 doses for dizziness. 05/04/20  Yes Carroll Sage, PA-C  Multiple Vitamin (MULTIVITAMIN WITH MINERALS) TABS tablet Take 1 tablet by mouth daily.   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 hearing of right ear 05/11/2021   Chronic systolic heart failure (HCC) 08/17/2014   ED (erectile dysfunction) of organic origin 10/21/2013   S/P CABG x 3 10/21/2013   Dyslipidemia 11/26/2007   Essential hypertension 11/26/2007   Coronary atherosclerosis 11/26/2007   Perennial allergic rhinitis with seasonal variation 11/26/2007   Obstructive sleep apnea syndrome 11/26/2007    Past Medical History:  Diagnosis Date   Allergic rhinitis    Allergy    Bradycardia    He has a hx of   Congestive heart failure (HCC)    EF equals 40%   Coronary artery disease    Status post previous anterior wall myocardial infarction.    Dyslipidemia    He has a hx   GERD (gastroesophageal reflux disease)    History of gastroesophageal reflux (GERD)    Hypercholesterolemia    He has a hx of    Hypertension    Myocardial infarction (HCC) 2008   OSA on CPAP    Sleep apnea    wears c-pap    Past Surgical History:  Procedure Laterality Date   APPENDECTOMY  Age 15   CARDIAC CATHETERIZATION  His EF is 40-45%. This has increased from 30% at the time of  last year(2008-9)   CORONARY ARTERY BYPASS GRAFT     KNEE SURGERY  2006   SHOULDER SURGERY     left   TONSILLECTOMY     US ECHOCARDIOGRAPHY  10-25-2007   Est EF 35-40%    Social History   Socioeconomic History   Marital status: Married    Spouse name: [name]   Number of children: 1   Years of education: Not on file   Highest education level: Not on file  Occupational History   Occupation: Retired/ high school custodian  Tobacco Use   Smoking status: Never   Smokeless tobacco: Never  Vaping Use   Vaping status: Never Used  Substance and Sexual Activity   Alcohol use: No    Alcohol/week: 0.0 standard drinks of alcohol    Comment: rare   Drug use: No   Sexual activity: Not Currently   Other Topics Concern   Not on file  Social History Narrative   Lives with wife   Social Drivers of Health   Financial Resource Strain: Low Risk  (10/08/2023)   Overall Financial Resource Strain (CARDIA)    Difficulty of Paying Living Expenses: Not hard at all  Food Insecurity: No Food Insecurity (10/08/2023)   Hunger Vital Sign    Worried About Running Out of Food in the Last Year: Never true    Ran Out of Food in the Last Year: Never true  Transportation Needs: No Transportation Needs (10/08/2023)   PRAPARE - Administrator, Civil Service (Medical): No    Lack of Transportation (Non-Medical): No  Physical Activity: Inactive (10/08/2023)   Exercise Vital Sign    Days of Exercise per Week: 0 days    Minutes of Exercise per Session: 0 min  Stress: No Stress Concern Present (10/08/2023)   [employer] of Occupational Health - Occupational Stress Questionnaire    Feeling of Stress : Not at all  Social Connections: Moderately Integrated (10/08/2023)   Social Connection and Isolation Panel [NHANES]    Frequency of Communication with Friends and Family: Twice a week    Frequency of Social Gatherings with Friends and Family: Three times a week    Attends Religious Services: More than 4 times per year    Active Member of Clubs or Organizations: No    Attends Banker Meetings: Never    Marital Status: Married  Catering manager Violence: Not At Risk (10/08/2023)   Humiliation, Afraid, Rape, and Kick questionnaire    Fear of Current or Ex-Partner: No    Emotionally Abused: No    Physically Abused: No    Sexually Abused: No    Family History  Problem Relation Age of Onset   Heart attack Father    Kidney cancer Mother    Colon cancer Neg Hx    Esophageal cancer Neg Hx    Pancreatic cancer Neg Hx    Prostate cancer Neg Hx    Rectal cancer Neg Hx    Stomach cancer Neg Hx      Review of Systems  Constitutional: Negative.  Negative for chills and  fever.  HENT:  Positive for congestion and sore throat.   Respiratory: Negative.  Negative for cough and shortness of breath.   Cardiovascular: Negative.  Negative for chest pain and palpitations.  Gastrointestinal:  Negative for abdominal pain, diarrhea, nausea and vomiting.  Genitourinary: Negative.  Negative for dysuria and hematuria.  Skin: Negative.  Negative for rash.  Neurological: Negative.  Negative for dizziness and headaches.  All other systems reviewed and are negative.   Vitals:   10/11/23 0901  BP: 120/80  Pulse: 62  Temp: 98.4 F (36.9 C)  SpO2: 97%    Physical Exam Vitals reviewed.  Constitutional:      Appearance: Normal appearance.  HENT:     Head: Normocephalic.     Mouth/Throat:     Mouth: Mucous membranes are moist.     Pharynx: Oropharynx is clear.  Eyes:     Extraocular Movements: Extraocular movements intact.     Conjunctiva/sclera: Conjunctivae normal.     Pupils: Pupils are equal, round, and reactive to light.  Cardiovascular:     Rate and Rhythm: Normal rate and regular rhythm.     Pulses: Normal pulses.     Heart sounds: Normal heart sounds.  Pulmonary:     Effort: Pulmonary effort is normal.     Breath sounds: Normal breath sounds.  Abdominal:     Palpations: Abdomen is soft.     Tenderness: There is no abdominal tenderness.  Musculoskeletal:     Cervical back: No tenderness.  Lymphadenopathy:     Cervical: No cervical adenopathy.  Skin:    General: Skin is warm and dry.     Capillary Refill: Capillary refill takes less than 2 seconds.  Neurological:     General: No focal deficit present.     Mental Status: He is alert and oriented to person, place, and time.  Psychiatric:        Mood and Affect: Mood normal.        Behavior: Behavior normal.      ASSESSMENT & PLAN: A total of 42 minutes was spent with the patient and counseling/coordination of care regarding preparing for this visit, review of most recent office visit notes,  review of most recent cardiologist office visit notes, review of multiple chronic medical conditions and their management, cardiovascular risks associated with hypertension and dyslipidemia, review of all medications, review of most recent bloodwork results, review of health maintenance items, education on nutrition, prognosis, documentation, and need for follow up.   Problem List Items Addressed This Visit       Cardiovascular and Mediastinum   Essential hypertension - Primary   Well-controlled hypertension Continue ramipril 5 mg daily, Aldactone 12.5 mg daily, furosemide 40 mg daily and carvedilol 12.5 mg twice a day      Chronic systolic heart failure (HCC)   No signs or symptoms of acute congestive heart failure Not retaining fluid.  Euvolemic Continues furosemide 40 mg daily, ramipril 5 mg daily and Aldactone 12.5 mg daily Also on carvedilol 12.5 mg twice a day Wt Readings from Last 3 Encounters:  10/11/23 212 lb (96.2 kg)  10/08/23 211 lb (95.7 kg)  04/23/23 211 lb (95.7 kg)           Respiratory   Obstructive sleep apnea syndrome   Benefits from CPAP and continues to use every night. Plan- continue auto 5-15        Other   Dyslipidemia   Chronic stable condition Continues atorvastatin 80 mg daily Diet and nutrition discussed      Patient Instructions  Health Maintenance After Age 30 After age 36, you are at a higher risk for certain long-term diseases and infections as well as injuries from falls. Falls are a major cause of broken bones and head injuries in people who are older than age  65. Getting regular preventive care can help to keep you healthy and well. Preventive care includes getting regular testing and making lifestyle changes as recommended by your health care provider. Talk with your health care provider about: Which screenings and tests you should have. A screening is a test that checks for a disease when you have no symptoms. A diet and exercise plan  that is right for you. What should I know about screenings and tests to prevent falls? Screening and testing are the best ways to find a health problem early. Early diagnosis and treatment give you the best chance of managing medical conditions that are common after age 77. Certain conditions and lifestyle choices may make you more likely to have a fall. Your health care provider may recommend: Regular vision checks. Poor vision and conditions such as cataracts can make you more likely to have a fall. If you wear glasses, make sure to get your prescription updated if your vision changes. Medicine review. Work with your health care provider to regularly review all of the medicines you are taking, including over-the-counter medicines. Ask your health care provider about any side effects that may make you more likely to have a fall. Tell your health care provider if any medicines that you take make you feel dizzy or sleepy. Strength and balance checks. Your health care provider may recommend certain tests to check your strength and balance while standing, walking, or changing positions. Foot health exam. Foot pain and numbness, as well as not wearing proper footwear, can make you more likely to have a fall. Screenings, including: Osteoporosis screening. Osteoporosis is a condition that causes the bones to get weaker and break more easily. Blood pressure screening. Blood pressure changes and medicines to control blood pressure can make you feel dizzy. Depression screening. You may be more likely to have a fall if you have a fear of falling, feel depressed, or feel unable to do activities that you used to do. Alcohol use screening. Using too much alcohol can affect your balance and may make you more likely to have a fall. Follow these instructions at home: Lifestyle Do not drink alcohol if: Your health care provider tells you not to drink. If you drink alcohol: Limit how much you have to: 0-1 drink a  day for women. 0-2 drinks a day for men. Know how much alcohol is in your drink. In the [place] one drink equals one 12 oz bottle of beer (355 mL), one 5 oz glass of wine (148 mL), or one 1 oz glass of hard liquor (44 mL). Do not use any products that contain nicotine or tobacco. These products include cigarettes, chewing tobacco, and vaping devices, such as e-cigarettes. If you need help quitting, ask your health care provider. Activity  Follow a regular exercise program to stay fit. This will help you maintain your balance. Ask your health care provider what types of exercise are appropriate for you. If you need a cane or walker, use it as recommended by your health care provider. Wear supportive shoes that have nonskid soles. Safety  Remove any tripping hazards, such as rugs, cords, and clutter. Install safety equipment such as grab bars in bathrooms and safety rails on stairs. Keep rooms and walkways well-lit. General instructions Talk with your health care provider about your risks for falling. Tell your health care provider if: You fall. Be sure to tell your health care provider about all falls, even ones that seem minor. You feel dizzy, tiredness (fatigue),  or off-balance. Take over-the-counter and prescription medicines only as told by your health care provider. These include supplements. Eat a healthy diet and maintain a healthy weight. A healthy diet includes low-fat dairy products, low-fat (lean) meats, and fiber from whole grains, beans, and lots of fruits and vegetables. Stay current with your vaccines. Schedule regular health, dental, and eye exams. Summary Having a healthy lifestyle and getting preventive care can help to protect your health and wellness after age 80. Screening and testing are the best way to find a health problem early and help you avoid having a fall. Early diagnosis and treatment give you the best chance for managing medical conditions that are more common  for people who are older than age 64. Falls are a major cause of broken bones and head injuries in people who are older than age 54. Take precautions to prevent a fall at home. Work with your health care provider to learn what changes you can make to improve your health and wellness and to prevent falls. This information is not intended to replace advice given to you by your health care provider. Make sure you discuss any questions you have with your health care provider. Document Revised: 02/28/2021 Document Reviewed: 02/28/2021 Elsevier Patient Education  2024 Elsevier Inc.      Edwina Barth, MD [facility]

## 2023-10-11 NOTE — Assessment & Plan Note (Signed)
Chronic stable condition Continues atorvastatin 80 mg daily Diet and nutrition discussed

## 2023-10-11 NOTE — Patient Instructions (Signed)
Health Maintenance After Age 67 After age 67, you are at a higher risk for certain long-term diseases and infections as well as injuries from falls. Falls are a major cause of broken bones and head injuries in people who are older than age 67. Getting regular preventive care can help to keep you healthy and well. Preventive care includes getting regular testing and making lifestyle changes as recommended by your health care provider. Talk with your health care provider about: Which screenings and tests you should have. A screening is a test that checks for a disease when you have no symptoms. A diet and exercise plan that is right for you. What should I know about screenings and tests to prevent falls? Screening and testing are the best ways to find a health problem early. Early diagnosis and treatment give you the best chance of managing medical conditions that are common after age 67. Certain conditions and lifestyle choices may make you more likely to have a fall. Your health care provider may recommend: Regular vision checks. Poor vision and conditions such as cataracts can make you more likely to have a fall. If you wear glasses, make sure to get your prescription updated if your vision changes. Medicine review. Work with your health care provider to regularly review all of the medicines you are taking, including over-the-counter medicines. Ask your health care provider about any side effects that may make you more likely to have a fall. Tell your health care provider if any medicines that you take make you feel dizzy or sleepy. Strength and balance checks. Your health care provider may recommend certain tests to check your strength and balance while standing, walking, or changing positions. Foot health exam. Foot pain and numbness, as well as not wearing proper footwear, can make you more likely to have a fall. Screenings, including: Osteoporosis screening. Osteoporosis is a condition that causes  the bones to get weaker and break more easily. Blood pressure screening. Blood pressure changes and medicines to control blood pressure can make you feel dizzy. Depression screening. You may be more likely to have a fall if you have a fear of falling, feel depressed, or feel unable to do activities that you used to do. Alcohol use screening. Using too much alcohol can affect your balance and may make you more likely to have a fall. Follow these instructions at home: Lifestyle Do not drink alcohol if: Your health care provider tells you not to drink. If you drink alcohol: Limit how much you have to: 0-1 drink a day for women. 0-2 drinks a day for men. Know how much alcohol is in your drink. In the [place] one drink equals one 12 oz bottle of beer (355 mL), one 5 oz glass of wine (148 mL), or one 1 oz glass of hard liquor (44 mL). Do not use any products that contain nicotine or tobacco. These products include cigarettes, chewing tobacco, and vaping devices, such as e-cigarettes. If you need help quitting, ask your health care provider. Activity  Follow a regular exercise program to stay fit. This will help you maintain your balance. Ask your health care provider what types of exercise are appropriate for you. If you need a cane or walker, use it as recommended by your health care provider. Wear supportive shoes that have nonskid soles. Safety  Remove any tripping hazards, such as rugs, cords, and clutter. Install safety equipment such as grab bars in bathrooms and safety rails on stairs. Keep rooms and walkways   well-lit. General instructions Talk with your health care provider about your risks for falling. Tell your health care provider if: You fall. Be sure to tell your health care provider about all falls, even ones that seem minor. You feel dizzy, tiredness (fatigue), or off-balance. Take over-the-counter and prescription medicines only as told by your health care provider. These include  supplements. Eat a healthy diet and maintain a healthy weight. A healthy diet includes low-fat dairy products, low-fat (lean) meats, and fiber from whole grains, beans, and lots of fruits and vegetables. Stay current with your vaccines. Schedule regular health, dental, and eye exams. Summary Having a healthy lifestyle and getting preventive care can help to protect your health and wellness after age 67. Screening and testing are the best way to find a health problem early and help you avoid having a fall. Early diagnosis and treatment give you the best chance for managing medical conditions that are more common for people who are older than age 67. Falls are a major cause of broken bones and head injuries in people who are older than age 67. Take precautions to prevent a fall at home. Work with your health care provider to learn what changes you can make to improve your health and wellness and to prevent falls. This information is not intended to replace advice given to you by your health care provider. Make sure you discuss any questions you have with your health care provider. Document Revised: 02/28/2021 Document Reviewed: 02/28/2021 Elsevier Patient Education  2024 Elsevier Inc.  

## 2023-10-11 NOTE — Assessment & Plan Note (Signed)
No signs or symptoms of acute congestive heart failure Not retaining fluid.  Euvolemic Continues furosemide 40 mg daily, ramipril 5 mg daily and Aldactone 12.5 mg daily Also on carvedilol 12.5 mg twice a day Wt Readings from Last 3 Encounters:  10/11/23 212 lb (96.2 kg)  10/08/23 211 lb (95.7 kg)  04/23/23 211 lb (95.7 kg)

## 2023-10-11 NOTE — Assessment & Plan Note (Signed)
Well-controlled hypertension Continue ramipril 5 mg daily, Aldactone 12.5 mg daily, furosemide 40 mg daily and carvedilol 12.5 mg twice a day

## 2023-10-11 NOTE — Assessment & Plan Note (Signed)
Benefits from CPAP and continues to use every night. Plan- continue auto 5-15

## 2023-10-22 ENCOUNTER — Other Ambulatory Visit: Payer: Self-pay | Admitting: Cardiovascular Disease

## 2023-12-14 ENCOUNTER — Ambulatory Visit: Payer: Self-pay | Admitting: Emergency Medicine

## 2023-12-14 NOTE — Telephone Encounter (Signed)
Copied from CRM [id number]. Topic: Clinical - Red Word Triage >> Dec 14, 2023 12:59 PM Irine Seal wrote: [facility] that prompted transfer to Nurse Triage: patients wife states that patient has all of the symptoms of shingles, except no blisters, but is having a lot of pain and burning. She states its a belt of pain around his chest, back and armpits, symptoms present 1 week , wants acute visit  Chief Complaint: pain, burning, belt of pain around chest back and armpits Symptoms: pain and burning Frequency: constant Pertinent Negatives: Patient denies rash, blisters, fever Disposition: [] ED /[] Urgent Care (no appt availability in office) / [x] Appointment(In office/virtual)/ []  [facility]/ [] Home Care/ [] Refused Recommended Disposition /[] [facility]/ []  Follow-up with PCP Additional Notes: per protocol patient apt made for Monday.  Instructed to go to [facility] if becomes worse.  Reason for Disposition  SEVERE pain (e.g., excruciating)  Answer Assessment - Initial Assessment Questions 1. APPEARANCE of RASH: "Describe the rash."      No rash at all 2. LOCATION: "Where is the rash located?"      Belt of pain around his chest back and armpits, symptoms present 1 week 3. ONSET: "When did the rash start?"      1 week 4. ITCHING: "Does the rash itch?" If Yes, ask: "How bad is the itch?"  (Scale 1-10; or mild, moderate, severe)     denies 5. PAIN: "Does the rash hurt?" If Yes, ask: "How bad is the pain?"  (Scale 0-10; or none, mild, moderate, severe)    - NONE (0): no pain    - MILD (1-3): doesn't interfere with normal activities     - MODERATE (4-7): interferes with normal activities or awakens from sleep     - SEVERE (8-10): excruciating pain, unable to do any normal activities     6/10 6. OTHER SYMPTOMS: "Do you have any other symptoms?" (e.g., fever)     denies 7. PREGNANCY: "Is there any chance you are pregnant?" "When was your last menstrual period?"     na  Protocols used:  Shingles (Zoster)-A-AH

## 2023-12-16 NOTE — Progress Notes (Unsigned)
    Subjective:    Patient ID: Key[place]a [place], male    DOB: 1956/06/10, 68 y.o.   MRN: [id number]      HPI [place] is here for No chief complaint on file.    Shingles pain -     Medications and allergies reviewed with patient and updated if appropriate.  Current Outpatient Medications on File Prior to Visit  Medication Sig Dispense Refill   aspirin EC 81 MG tablet Take 1 tablet (81 mg total) by mouth daily.     atorvastatin (LIPITOR) 80 MG tablet TAKE ONE TABLET BY MOUTH DAILY IN THE EVENING AT 6PM 90 tablet 1   carvedilol (COREG) 12.5 MG tablet TAKE ONE TABLET BY MOUTH TWICE DAILY WITH A MEAL 180 tablet 1   furosemide (LASIX) 40 MG tablet TAKE ONE TABLET BY MOUTH ONCE A DAY 90 tablet 1   meclizine (ANTIVERT) 25 MG tablet Take 1 tablet (25 mg total) by mouth Once PRN for up to 15 doses for dizziness. 15 tablet 0   Multiple Vitamin (MULTIVITAMIN WITH MINERALS) TABS tablet Take 1 tablet by mouth daily.     potassium chloride SA (KLOR-CON M) 20 MEQ tablet TAKE ONE TABLET BY MOUTH ONCE A DAY 90 tablet 1   ramipril (ALTACE) 5 MG capsule TAKE ONE CAPSULE BY MOUTH ONCE A DAY 90 capsule 1   spironolactone (ALDACTONE) 25 MG tablet TAKE ONE-HALF TABLET BY MOUTH DAILY 45 tablet 1   No current facility-administered medications on file prior to visit.    Review of Systems     Objective:  There were no vitals filed for this visit. BP Readings from Last 3 Encounters:  10/11/23 120/80  04/23/23 122/78  04/16/23 118/66   Wt Readings from Last 3 Encounters:  10/11/23 212 lb (96.2 kg)  10/08/23 211 lb (95.7 kg)  04/23/23 211 lb (95.7 kg)   There is no height or weight on file to calculate BMI.    Physical Exam         Assessment & Plan:    See Problem List for Assessment and Plan of chronic medical problems.

## 2023-12-17 ENCOUNTER — Ambulatory Visit (INDEPENDENT_AMBULATORY_CARE_PROVIDER_SITE_OTHER): Payer: Medicare PPO

## 2023-12-17 ENCOUNTER — Encounter: Payer: Self-pay | Admitting: Internal Medicine

## 2023-12-17 ENCOUNTER — Ambulatory Visit: Payer: Medicare PPO | Admitting: Internal Medicine

## 2023-12-17 VITALS — BP 120/78 | HR 75 | Temp 97.7°F | Ht 66.0 in

## 2023-12-17 DIAGNOSIS — M5134 Other intervertebral disc degeneration, thoracic region: Secondary | ICD-10-CM | POA: Diagnosis not present

## 2023-12-17 DIAGNOSIS — M5414 Radiculopathy, thoracic region: Secondary | ICD-10-CM

## 2023-12-17 DIAGNOSIS — M40204 Unspecified kyphosis, thoracic region: Secondary | ICD-10-CM | POA: Diagnosis not present

## 2023-12-17 DIAGNOSIS — M542 Cervicalgia: Secondary | ICD-10-CM | POA: Diagnosis not present

## 2023-12-17 DIAGNOSIS — M50322 Other cervical disc degeneration at C5-C6 level: Secondary | ICD-10-CM | POA: Diagnosis not present

## 2023-12-17 NOTE — Patient Instructions (Addendum)
      Have xrays done downstairs    Medications changes include :   None     Return if symptoms worsen or fail to improve.

## 2023-12-18 ENCOUNTER — Telehealth: Payer: Self-pay | Admitting: Internal Medicine

## 2023-12-18 DIAGNOSIS — M5414 Radiculopathy, thoracic region: Secondary | ICD-10-CM

## 2023-12-18 DIAGNOSIS — M419 Scoliosis, unspecified: Secondary | ICD-10-CM

## 2023-12-18 DIAGNOSIS — M40204 Unspecified kyphosis, thoracic region: Secondary | ICD-10-CM

## 2023-12-18 NOTE — Telephone Encounter (Signed)
 Referral ordered

## 2023-12-18 NOTE — Telephone Encounter (Signed)
 Please call him regarding results of x-rays.  X-rays of the neck and middle back did not show any acute abnormalities.  He does have some curvature of his spine which is chronic, but has progressed.  This is 1 thing they could be pinching the nerve that is causing the sensation he is getting around the chest.  I think it would be a good idea for him to see a spine specialist to see if there is anything we can do to prevent this from getting worse and possibly help with his symptoms.  Let me know if he is okay with the referral and I will order it.  They will call him to schedule the appointment.

## 2023-12-27 DIAGNOSIS — M50123 Cervical disc disorder at C6-C7 level with radiculopathy: Secondary | ICD-10-CM | POA: Diagnosis not present

## 2023-12-27 DIAGNOSIS — M47814 Spondylosis without myelopathy or radiculopathy, thoracic region: Secondary | ICD-10-CM | POA: Diagnosis not present

## 2023-12-27 DIAGNOSIS — G4733 Obstructive sleep apnea (adult) (pediatric): Secondary | ICD-10-CM | POA: Diagnosis not present

## 2024-04-10 ENCOUNTER — Ambulatory Visit: Payer: Medicare PPO | Admitting: Emergency Medicine

## 2024-04-10 ENCOUNTER — Encounter: Payer: Self-pay | Admitting: Emergency Medicine

## 2024-04-10 ENCOUNTER — Ambulatory Visit: Payer: Self-pay | Admitting: Emergency Medicine

## 2024-04-10 VITALS — BP 118/80 | HR 62 | Temp 98.4°F | Ht 66.0 in | Wt 211.0 lb

## 2024-04-10 DIAGNOSIS — I5022 Chronic systolic (congestive) heart failure: Secondary | ICD-10-CM | POA: Diagnosis not present

## 2024-04-10 DIAGNOSIS — I10 Essential (primary) hypertension: Secondary | ICD-10-CM | POA: Diagnosis not present

## 2024-04-10 DIAGNOSIS — I110 Hypertensive heart disease with heart failure: Secondary | ICD-10-CM

## 2024-04-10 DIAGNOSIS — I2510 Atherosclerotic heart disease of native coronary artery without angina pectoris: Secondary | ICD-10-CM | POA: Diagnosis not present

## 2024-04-10 DIAGNOSIS — G4733 Obstructive sleep apnea (adult) (pediatric): Secondary | ICD-10-CM | POA: Diagnosis not present

## 2024-04-10 DIAGNOSIS — E785 Hyperlipidemia, unspecified: Secondary | ICD-10-CM | POA: Diagnosis not present

## 2024-04-10 LAB — CBC WITH DIFFERENTIAL/PLATELET
Basophils Absolute: 0 10*3/uL (ref 0.0–0.1)
Basophils Relative: 0.5 % (ref 0.0–3.0)
Eosinophils Absolute: 0.2 10*3/uL (ref 0.0–0.7)
Eosinophils Relative: 2.7 % (ref 0.0–5.0)
HCT: 41.6 % (ref 39.0–52.0)
Hemoglobin: 14.5 g/dL (ref 13.0–17.0)
Lymphocytes Relative: 36.9 % (ref 12.0–46.0)
Lymphs Abs: 2.1 10*3/uL (ref 0.7–4.0)
MCHC: 34.9 g/dL (ref 30.0–36.0)
MCV: 87 fl (ref 78.0–100.0)
Monocytes Absolute: 0.4 10*3/uL (ref 0.1–1.0)
Monocytes Relative: 6.9 % (ref 3.0–12.0)
Neutro Abs: 3.1 10*3/uL (ref 1.4–7.7)
Neutrophils Relative %: 53 % (ref 43.0–77.0)
Platelets: 251 10*3/uL (ref 150.0–400.0)
RBC: 4.78 Mil/uL (ref 4.22–5.81)
RDW: 13.3 % (ref 11.5–15.5)
WBC: 5.8 10*3/uL (ref 4.0–10.5)

## 2024-04-10 LAB — COMPREHENSIVE METABOLIC PANEL WITH GFR
ALT: 26 U/L (ref 0–53)
AST: 25 U/L (ref 0–37)
Albumin: 4.4 g/dL (ref 3.5–5.2)
Alkaline Phosphatase: 67 U/L (ref 39–117)
BUN: 15 mg/dL (ref 6–23)
CO2: 27 meq/L (ref 19–32)
Calcium: 9.2 mg/dL (ref 8.4–10.5)
Chloride: 103 meq/L (ref 96–112)
Creatinine, Ser: 0.94 mg/dL (ref 0.40–1.50)
GFR: 83.56 mL/min (ref >60.00)
Glucose, Bld: 98 mg/dL (ref 70–99)
Potassium: 4 meq/L (ref 3.5–5.1)
Sodium: 137 meq/L (ref 135–145)
Total Bilirubin: 0.9 mg/dL (ref 0.2–1.2)
Total Protein: 7.4 g/dL (ref 6.0–8.3)

## 2024-04-10 LAB — LIPID PANEL
Cholesterol: 106 mg/dL (ref 0–200)
HDL: 35.7 mg/dL — ABNORMAL LOW (ref >39.00)
LDL Cholesterol: 47 mg/dL (ref 0–99)
NonHDL: 70.59
Total CHOL/HDL Ratio: 3
Triglycerides: 118 mg/dL (ref 0.0–149.0)
VLDL: 23.6 mg/dL (ref 0.0–40.0)

## 2024-04-10 LAB — HEMOGLOBIN A1C: Hgb A1c MFr Bld: 5.1 % (ref 4.6–6.5)

## 2024-04-10 NOTE — Progress Notes (Signed)
 [name] Federation 68 y.o.   Chief Complaint  Patient presents with   Follow-up    Patient here for 6 month f/u for HTN. No other concerns.     HISTORY OF PRESENT ILLNESS: This is a 68 y.o. male A1A here for 59-month follow-up of chronic medical conditions including hypertension and chronic CHF Accompanied by wife.  Overall doing well.  Has no complaints or medical concerns today.  HPI   Prior to Admission medications   Medication Sig Start Date End Date Taking? Authorizing Provider  aspirin  EC 81 MG tablet Take 1 tablet (81 mg total) by mouth daily. 08/17/14  Yes Nahser, [name] Purple, MD  atorvastatin  (LIPITOR) 80 MG tablet TAKE ONE TABLET BY MOUTH DAILY IN THE EVENING AT 6PM 10/22/23  Yes Nahser, [name] Purple, MD  carvedilol  (COREG ) 12.5 MG tablet TAKE ONE TABLET BY MOUTH TWICE DAILY WITH A MEAL 10/22/23  Yes Nahser, [name] Purple, MD  furosemide  (LASIX ) 40 MG tablet TAKE ONE TABLET BY MOUTH ONCE A DAY 10/22/23  Yes Nahser, [name] Purple, MD  meclizine  (ANTIVERT ) 25 MG tablet Take 1 tablet (25 mg total) by mouth Once PRN for up to 15 doses for dizziness. 05/04/20  Yes Volney Grumbles, PA-C  Multiple Vitamin (MULTIVITAMIN WITH MINERALS) TABS tablet Take 1 tablet by mouth daily.   Yes [provider]  potassium chloride  SA (KLOR-CON  M) 20 MEQ tablet TAKE ONE TABLET BY MOUTH ONCE A DAY 10/22/23  Yes Nahser, [name] Purple, MD  ramipril  (ALTACE ) 5 MG capsule TAKE ONE CAPSULE BY MOUTH ONCE A DAY 10/22/23  Yes Nahser, [name] Purple, MD  spironolactone  (ALDACTONE ) 25 MG tablet TAKE ONE-HALF TABLET BY MOUTH DAILY 10/22/23  Yes Nahser, [name] Purple, MD    No Known Allergies  Patient Active Problem List   Diagnosis Date Noted   Sensorineural hearing loss (SNHL) of left ear with unrestricted hearing of right ear 05/11/2021   Chronic systolic heart failure (HCC) 08/17/2014   ED (erectile dysfunction) of organic origin 10/21/2013   S/P CABG x 3 10/21/2013   Dyslipidemia 11/26/2007   Essential hypertension  11/26/2007   Coronary atherosclerosis 11/26/2007   Perennial allergic rhinitis with seasonal variation 11/26/2007   Obstructive sleep apnea syndrome 11/26/2007    Past Medical History:  Diagnosis Date   Allergic rhinitis    Allergy    Bradycardia    He has a hx of   Congestive heart failure (HCC)    EF equals 40%   Coronary artery disease    Status post previous anterior wall myocardial infarction.    Dyslipidemia    He has a hx   GERD (gastroesophageal reflux disease)    History of gastroesophageal reflux (GERD)    Hypercholesterolemia    He has a hx of    Hypertension    Myocardial infarction (HCC) 2008   OSA on CPAP    Sleep apnea    wears c-pap    Past Surgical History:  Procedure Laterality Date   APPENDECTOMY  Age 55   CARDIAC CATHETERIZATION     His EF is 40-45%. This has increased from 30% at the time of  last year(2008-9)   CORONARY ARTERY BYPASS GRAFT     KNEE SURGERY  2006   SHOULDER SURGERY     left   TONSILLECTOMY     US  ECHOCARDIOGRAPHY  10-25-2007   Est EF 35-40%    Social History   Socioeconomic History   Marital status: Married    Spouse name: Regis Captain  Number of children: 1   Years of education: Not on file   Highest education level: Not on file  Occupational History   Occupation: Retired/ high school custodian  Tobacco Use   Smoking status: Never   Smokeless tobacco: Never  Vaping Use   Vaping status: Never Used  Substance and Sexual Activity   Alcohol use: No    Alcohol/week: 0.0 standard drinks of alcohol    Comment: rare   Drug use: No   Sexual activity: Not Currently  Other Topics Concern   Not on file  Social History Narrative   Lives with wife   Social Drivers of Health   Financial Resource Strain: Low Risk  (10/08/2023)   Overall Financial Resource Strain (CARDIA)    Difficulty of Paying Living Expenses: Not hard at all  Food Insecurity: No Food Insecurity (10/08/2023)   Hunger Vital Sign    Worried About Running Out  of Food in the Last Year: Never true    Ran Out of Food in the Last Year: Never true  Transportation Needs: No Transportation Needs (10/08/2023)   PRAPARE - Administrator, Civil Service (Medical): No    Lack of Transportation (Non-Medical): No  Physical Activity: Inactive (10/08/2023)   Exercise Vital Sign    Days of Exercise per Week: 0 days    Minutes of Exercise per Session: 0 min  Stress: No Stress Concern Present (10/08/2023)   [employer] of Occupational Health - Occupational Stress Questionnaire    Feeling of Stress : Not at all  Social Connections: Moderately Integrated (10/08/2023)   Social Connection and Isolation Panel    Frequency of Communication with Friends and Family: Twice a week    Frequency of Social Gatherings with Friends and Family: Three times a week    Attends Religious Services: More than 4 times per year    Active Member of Clubs or Organizations: No    Attends Banker Meetings: Never    Marital Status: Married  Catering manager Violence: Not At Risk (10/08/2023)   Humiliation, Afraid, Rape, and Kick questionnaire    Fear of Current or Ex-Partner: No    Emotionally Abused: No    Physically Abused: No    Sexually Abused: No    Family History  Problem Relation Age of Onset   Heart attack Father    Kidney cancer Mother    Colon cancer Neg Hx    Esophageal cancer Neg Hx    Pancreatic cancer Neg Hx    Prostate cancer Neg Hx    Rectal cancer Neg Hx    Stomach cancer Neg Hx      Review of Systems  Constitutional: Negative.  Negative for chills and fever.  HENT: Negative.  Negative for congestion and sore throat.   Respiratory: Negative.  Negative for cough and shortness of breath.   Cardiovascular: Negative.  Negative for chest pain and palpitations.  Gastrointestinal:  Negative for abdominal pain, diarrhea, nausea and vomiting.  Genitourinary: Negative.  Negative for dysuria and hematuria.  Skin: Negative.  Negative  for rash.  Neurological: Negative.  Negative for dizziness and headaches.  All other systems reviewed and are negative.   Vitals:   04/10/24 0900  BP: 118/80  Pulse: 62  Temp: 98.4 F (36.9 C)  SpO2: 95%    Physical Exam Vitals reviewed.  Constitutional:      Appearance: Normal appearance.  HENT:     Head: Normocephalic.   Eyes:  Extraocular Movements: Extraocular movements intact.     Pupils: Pupils are equal, round, and reactive to light.    Cardiovascular:     Rate and Rhythm: Normal rate and regular rhythm.     Pulses: Normal pulses.     Heart sounds: Normal heart sounds.  Pulmonary:     Effort: Pulmonary effort is normal.     Breath sounds: Normal breath sounds.   Musculoskeletal:     Cervical back: No tenderness.  Lymphadenopathy:     Cervical: No cervical adenopathy.   Skin:    General: Skin is warm and dry.     Capillary Refill: Capillary refill takes less than 2 seconds.   Neurological:     General: No focal deficit present.     Mental Status: He is alert and oriented to person, place, and time.   Psychiatric:        Mood and Affect: Mood normal.        Behavior: Behavior normal.      ASSESSMENT & PLAN: A total of 44 minutes was spent with the patient and counseling/coordination of care regarding preparing for this visit, review of most recent office visit notes, review of multiple chronic medical conditions and their management, review of all medications, review of most recent bloodwork results, review of health maintenance items, education on nutrition, prognosis, documentation, and need for follow up.  Problem List Items Addressed This Visit       Cardiovascular and Mediastinum   Essential hypertension   Well-controlled hypertension Continue ramipril  5 mg daily, Aldactone  12.5 mg daily, furosemide  40 mg daily and carvedilol  12.5 mg twice a day      Relevant Orders   Comprehensive metabolic panel with GFR   CBC with  Differential/Platelet   Hemoglobin A1c   Lipid panel   Coronary atherosclerosis   Clinically stable.  No recent anginal episodes. Continues daily baby aspirin       Chronic systolic heart failure (HCC) - Primary   BP Readings from Last 3 Encounters:  04/10/24 118/80  12/17/23 120/78  10/11/23 120/80  No signs or symptoms of acute congestive heart failure Not retaining fluid.  Euvolemic Continues furosemide  40 mg daily, ramipril  5 mg daily and Aldactone  12.5 mg daily Also on carvedilol  12.5 mg twice a day       Relevant Orders   Comprehensive metabolic panel with GFR   CBC with Differential/Platelet   Hemoglobin A1c   Lipid panel     Respiratory   Obstructive sleep apnea syndrome   Benefits from CPAP and continues to use every night. Plan- continue auto 5-15        Other   Dyslipidemia   Chronic stable condition Continues atorvastatin  80 mg daily Diet and nutrition discussed      Relevant Orders   Comprehensive metabolic panel with GFR   CBC with Differential/Platelet   Hemoglobin A1c   Lipid panel   Patient Instructions  Health Maintenance After Age 51 After age 30, you are at a higher risk for certain long-term diseases and infections as well as injuries from falls. Falls are a major cause of broken bones and head injuries in people who are older than age 20. Getting regular preventive care can help to keep you healthy and well. Preventive care includes getting regular testing and making lifestyle changes as recommended by your health care provider. Talk with your health care provider about: Which screenings and tests you should have. A screening is a test that checks for a  disease when you have no symptoms. A diet and exercise plan that is right for you. What should I know about screenings and tests to prevent falls? Screening and testing are the best ways to find a health problem early. Early diagnosis and treatment give you the best chance of managing medical  conditions that are common after age 57. Certain conditions and lifestyle choices may make you more likely to have a fall. Your health care provider may recommend: Regular vision checks. Poor vision and conditions such as cataracts can make you more likely to have a fall. If you wear glasses, make sure to get your prescription updated if your vision changes. Medicine review. Work with your health care provider to regularly review all of the medicines you are taking, including over-the-counter medicines. Ask your health care provider about any side effects that may make you more likely to have a fall. Tell your health care provider if any medicines that you take make you feel dizzy or sleepy. Strength and balance checks. Your health care provider may recommend certain tests to check your strength and balance while standing, walking, or changing positions. Foot health exam. Foot pain and numbness, as well as not wearing proper footwear, can make you more likely to have a fall. Screenings, including: Osteoporosis screening. Osteoporosis is a condition that causes the bones to get weaker and break more easily. Blood pressure screening. Blood pressure changes and medicines to control blood pressure can make you feel dizzy. Depression screening. You may be more likely to have a fall if you have a fear of falling, feel depressed, or feel unable to do activities that you used to do. Alcohol use screening. Using too much alcohol can affect your balance and may make you more likely to have a fall. Follow these instructions at home: Lifestyle Do not drink alcohol if: Your health care provider tells you not to drink. If you drink alcohol: Limit how much you have to: 0-1 drink a day for women. 0-2 drinks a day for men. Know how much alcohol is in your drink. In the [place] one drink equals one 12 oz bottle of beer (355 mL), one 5 oz glass of wine (148 mL), or one 1 oz glass of hard liquor (44 mL). Do not use  any products that contain nicotine or tobacco. These products include cigarettes, chewing tobacco, and vaping devices, such as e-cigarettes. If you need help quitting, ask your health care provider. Activity  Follow a regular exercise program to stay fit. This will help you maintain your balance. Ask your health care provider what types of exercise are appropriate for you. If you need a cane or walker, use it as recommended by your health care provider. Wear supportive shoes that have nonskid soles. Safety  Remove any tripping hazards, such as rugs, cords, and clutter. Install safety equipment such as grab bars in bathrooms and safety rails on stairs. Keep rooms and walkways well-lit. General instructions Talk with your health care provider about your risks for falling. Tell your health care provider if: You fall. Be sure to tell your health care provider about all falls, even ones that seem minor. You feel dizzy, tiredness (fatigue), or off-balance. Take over-the-counter and prescription medicines only as told by your health care provider. These include supplements. Eat a healthy diet and maintain a healthy weight. A healthy diet includes low-fat dairy products, low-fat (lean) meats, and fiber from whole grains, beans, and lots of fruits and vegetables. Stay current with your  vaccines. Schedule regular health, dental, and eye exams. Summary Having a healthy lifestyle and getting preventive care can help to protect your health and wellness after age 96. Screening and testing are the best way to find a health problem early and help you avoid having a fall. Early diagnosis and treatment give you the best chance for managing medical conditions that are more common for people who are older than age 65. Falls are a major cause of broken bones and head injuries in people who are older than age 29. Take precautions to prevent a fall at home. Work with your health care provider to learn what changes  you can make to improve your health and wellness and to prevent falls. This information is not intended to replace advice given to you by your health care provider. Make sure you discuss any questions you have with your health care provider. Document Revised: 02/28/2021 Document Reviewed: 02/28/2021 Elsevier Patient Education  2024 Elsevier Inc.     Maryagnes Small, MD [facility] at [facility]

## 2024-04-10 NOTE — Assessment & Plan Note (Signed)
Well-controlled hypertension Continue ramipril 5 mg daily, Aldactone 12.5 mg daily, furosemide 40 mg daily and carvedilol 12.5 mg twice a day

## 2024-04-10 NOTE — Assessment & Plan Note (Signed)
Benefits from CPAP and continues to use every night. Plan- continue auto 5-15

## 2024-04-10 NOTE — Patient Instructions (Signed)
 Health Maintenance After Age 68 After age 4, you are at a higher risk for certain long-term diseases and infections as well as injuries from falls. Falls are a major cause of broken bones and head injuries in people who are older than age 47. Getting regular preventive care can help to keep you healthy and well. Preventive care includes getting regular testing and making lifestyle changes as recommended by your health care provider. Talk with your health care provider about: Which screenings and tests you should have. A screening is a test that checks for a disease when you have no symptoms. A diet and exercise plan that is right for you. What should I know about screenings and tests to prevent falls? Screening and testing are the best ways to find a health problem early. Early diagnosis and treatment give you the best chance of managing medical conditions that are common after age 37. Certain conditions and lifestyle choices may make you more likely to have a fall. Your health care provider may recommend: Regular vision checks. Poor vision and conditions such as cataracts can make you more likely to have a fall. If you wear glasses, make sure to get your prescription updated if your vision changes. Medicine review. Work with your health care provider to regularly review all of the medicines you are taking, including over-the-counter medicines. Ask your health care provider about any side effects that may make you more likely to have a fall. Tell your health care provider if any medicines that you take make you feel dizzy or sleepy. Strength and balance checks. Your health care provider may recommend certain tests to check your strength and balance while standing, walking, or changing positions. Foot health exam. Foot pain and numbness, as well as not wearing proper footwear, can make you more likely to have a fall. Screenings, including: Osteoporosis screening. Osteoporosis is a condition that causes  the bones to get weaker and break more easily. Blood pressure screening. Blood pressure changes and medicines to control blood pressure can make you feel dizzy. Depression screening. You may be more likely to have a fall if you have a fear of falling, feel depressed, or feel unable to do activities that you used to do. Alcohol use screening. Using too much alcohol can affect your balance and may make you more likely to have a fall. Follow these instructions at home: Lifestyle Do not drink alcohol if: Your health care provider tells you not to drink. If you drink alcohol: Limit how much you have to: 0-1 drink a day for women. 0-2 drinks a day for men. Know how much alcohol is in your drink. In the [place] one drink equals one 12 oz bottle of beer (355 mL), one 5 oz glass of wine (148 mL), or one 1 oz glass of hard liquor (44 mL). Do not use any products that contain nicotine or tobacco. These products include cigarettes, chewing tobacco, and vaping devices, such as e-cigarettes. If you need help quitting, ask your health care provider. Activity  Follow a regular exercise program to stay fit. This will help you maintain your balance. Ask your health care provider what types of exercise are appropriate for you. If you need a cane or walker, use it as recommended by your health care provider. Wear supportive shoes that have nonskid soles. Safety  Remove any tripping hazards, such as rugs, cords, and clutter. Install safety equipment such as grab bars in bathrooms and safety rails on stairs. Keep rooms and walkways  well-lit. General instructions Talk with your health care provider about your risks for falling. Tell your health care provider if: You fall. Be sure to tell your health care provider about all falls, even ones that seem minor. You feel dizzy, tiredness (fatigue), or off-balance. Take over-the-counter and prescription medicines only as told by your health care provider. These include  supplements. Eat a healthy diet and maintain a healthy weight. A healthy diet includes low-fat dairy products, low-fat (lean) meats, and fiber from whole grains, beans, and lots of fruits and vegetables. Stay current with your vaccines. Schedule regular health, dental, and eye exams. Summary Having a healthy lifestyle and getting preventive care can help to protect your health and wellness after age 11. Screening and testing are the best way to find a health problem early and help you avoid having a fall. Early diagnosis and treatment give you the best chance for managing medical conditions that are more common for people who are older than age 28. Falls are a major cause of broken bones and head injuries in people who are older than age 48. Take precautions to prevent a fall at home. Work with your health care provider to learn what changes you can make to improve your health and wellness and to prevent falls. This information is not intended to replace advice given to you by your health care provider. Make sure you discuss any questions you have with your health care provider. Document Revised: 02/28/2021 Document Reviewed: 02/28/2021 Elsevier Patient Education  2024 [employer].

## 2024-04-10 NOTE — Assessment & Plan Note (Signed)
 Clinically stable.  No recent anginal episodes. Continues daily baby aspirin 

## 2024-04-10 NOTE — Assessment & Plan Note (Signed)
Chronic stable condition Continues atorvastatin 80 mg daily Diet and nutrition discussed

## 2024-04-10 NOTE — Assessment & Plan Note (Signed)
 BP Readings from Last 3 Encounters:  04/10/24 118/80  12/17/23 120/78  10/11/23 120/80  No signs or symptoms of acute congestive heart failure Not retaining fluid.  Euvolemic Continues furosemide  40 mg daily, ramipril  5 mg daily and Aldactone  12.5 mg daily Also on carvedilol  12.5 mg twice a day

## 2024-04-13 NOTE — Progress Notes (Unsigned)
 HPI male never smoker followed for OSA, allergic rhinitis, complicated by HBP, CAD/ MI, CHF, GERD NPSG 02/22/11- AHI 24.2/ hr, desatutration to 86%, CPAP to 9, body weight 193 lbs  ----------------------------------------------------------------------------------  04/16/23- 67 yoM never smoker followed for OSA, Allergic Rhinitis, complicated by HTN, CAD/MI/ CABG, sCHF, Gr 2 DD, Hyperlipidemia, GERD, Vertigo(ENT), CPAP auto 5-15/ [facility] [employer]-compliance- No SD card Body weight today-209 lbs He reports doing well with CPAP with no problems. This machine is about 68 years old. Heart status reported stable, pending cardiology f/u. Breathing is fine.  04/15/24- 68 yoM never smoker followed for OSA, Allergic Rhinitis, complicated by HTN, CAD/MI/ CABG, sCHF, Gr 2 DD, Hyperlipidemia, GERD, Vertigo(ENT), CPAP auto 5-15/ [employer]-compliance- No SD card Body weight today-  ROS-see HPI + = positive Constitutional:   No-   weight loss, night sweats, fevers, chills, fatigue, lassitude. HEENT:   No-  headaches, difficulty swallowing, tooth/dental problems, sore throat,       No-  sneezing, itching, ear ache, + nasal congestion, post nasal drip,  CV:  No-   chest pain, orthopnea, PND, swelling in lower extremities, anasarca,             dizziness, palpitations Resp: No-   shortness of breath with exertion or at rest.              No-   productive cough,  No non-productive cough,  No- coughing up of blood.              No-   change in color of mucus.  No- wheezing.   Skin: No-   rash or lesions. GI:  No-   heartburn, indigestion, abdominal pain, nausea, vomiting,  GU:  MS:  No-   joint pain or swelling.  Neuro-     +vertigo Psych:  No- change in mood or affect. No depression or anxiety.  No memory loss.  OBJ- Physical Exam   stable baseline exam General- Alert, Oriented, Affect-appropriate, Distress- none acute, +stocky build Skin- rash-none, lesions- none, excoriation- none Lymphadenopathy-  none Head- atraumatic            Eyes- Gross vision intact, PERRLA, conjunctivae and secretions clear            Ears- Hearing, canals-normal            Nose- Clear, no-Septal dev, mucus, polyps, erosion, perforation             Throat- Mallampati IV , mucosa clear , drainage- none, tonsils- atrophic Neck- flexible , trachea midline, no stridor , thyroid nl, carotid no bruit Chest - symmetrical excursion , unlabored           Heart/CV- RRR , no murmur , no gallop  , no rub, nl s1 s2                           - JVD- none , edema- none, stasis changes- none, varices- none           Lung- clear to P&A, wheeze- none, cough- none , dullness-none, rub- none           Chest wall-  Abd-  Br/ Gen/ Rectal- Not done, not indicated Extrem- cyanosis- none, clubbing, none, atrophy- none, strength- nl Neuro- grossly intact to observation, no nystagmus

## 2024-04-14 ENCOUNTER — Other Ambulatory Visit: Payer: Self-pay | Admitting: Cardiovascular Disease

## 2024-04-15 ENCOUNTER — Ambulatory Visit: Payer: Medicare PPO | Admitting: Internal Medicine

## 2024-04-15 ENCOUNTER — Encounter: Payer: Self-pay | Admitting: Internal Medicine

## 2024-04-15 VITALS — BP 130/71 | HR 57 | Temp 98.1°F | Resp 18 | Ht 66.0 in | Wt 212.4 lb

## 2024-04-15 DIAGNOSIS — G4733 Obstructive sleep apnea (adult) (pediatric): Secondary | ICD-10-CM | POA: Diagnosis not present

## 2024-04-15 NOTE — Patient Instructions (Addendum)
 Glad you are doing well. We can continue CPAP auto 5-20. Please let us  know if we can help.

## 2024-04-17 DIAGNOSIS — G4733 Obstructive sleep apnea (adult) (pediatric): Secondary | ICD-10-CM | POA: Diagnosis not present

## 2024-05-04 NOTE — Progress Notes (Unsigned)
 No chief complaint on file.  History of Present Illness: 68 yo male with history of CAD s/p 3V CABG, HTN, HLD, non-ischemic cardiomyopathy, chronic systolic CHF and sleep apnea who is here today for follow up. He has been followed by Dr. Alveta. He had 3V CABG in 2008 (LIMA to LAD, SVG to OM1/OM2). Echo 2021 with LVEF=45-50% but his EF has been as low as 30% in the past. Mild MR, mild AI. Stress myoview  in 2022 with scar but no ischemia.   He is here today for follow up. The patient denies any chest pain, dyspnea, palpitations, lower extremity edema, orthopnea, PND, dizziness, near syncope or syncope.    Primary Care Physician: Sean Emil Schanz, MD   Past Medical History:  Diagnosis Date   Allergic rhinitis    Allergy    Bradycardia    He has a hx of   Congestive heart failure (HCC)    EF equals 40%   Coronary artery disease    Status post previous anterior wall myocardial infarction.    Dyslipidemia    He has a hx   GERD (gastroesophageal reflux disease)    History of gastroesophageal reflux (GERD)    Hypercholesterolemia    He has a hx of    Hypertension    Myocardial infarction (HCC) 2008   OSA on CPAP    Sleep apnea    wears c-pap    Past Surgical History:  Procedure Laterality Date   APPENDECTOMY  Age 18   CARDIAC CATHETERIZATION     His EF is 40-45%. This has increased from 30% at the time of  last year(2008-9)   CORONARY ARTERY BYPASS GRAFT     KNEE SURGERY  2006   SHOULDER SURGERY     left   TONSILLECTOMY     US  ECHOCARDIOGRAPHY  10-25-2007   Est EF 35-40%    Current Outpatient Medications  Medication Sig Dispense Refill   aspirin  EC 81 MG tablet Take 1 tablet (81 mg total) by mouth daily.     atorvastatin  (LIPITOR) 80 MG tablet Take 1 tablet (80 mg total) by mouth daily. PLEASE KEEP UPCOMING APPOINTMENT IN JULY TO RECEIVE ADDITIONAL REFILLS. THANK YOU! 30 tablet 1   carvedilol  (COREG ) 12.5 MG tablet Take 1 tablet (12.5 mg total) by mouth 2 (two)  times daily with a meal. PLEASE KEEP UPCOMING APPOINTMENT IN JULY TO RECEIVE ADDITIONAL REFILLS. THANK YOU! 60 tablet 1   furosemide  (LASIX ) 40 MG tablet Take 1 tablet (40 mg total) by mouth daily. PLEASE KEEP UPCOMING APPOINTMENT IN JULY TO RECEIVE ADDITIONAL REFILLS. THANK YOU! 30 tablet 1   meclizine  (ANTIVERT ) 25 MG tablet Take 1 tablet (25 mg total) by mouth Once PRN for up to 15 doses for dizziness. 15 tablet 0   Multiple Vitamin (MULTIVITAMIN WITH MINERALS) TABS tablet Take 1 tablet by mouth daily.     potassium chloride  SA (KLOR-CON  M) 20 MEQ tablet Take 1 tablet (20 mEq total) by mouth daily. PLEASE KEEP UPCOMING APPOINTMENT IN JULY TO RECEIVE ADDITIONAL REFILLS. THANK YOU! 30 tablet 1   ramipril  (ALTACE ) 5 MG capsule Take 1 capsule (5 mg total) by mouth daily. PLEASE KEEP UPCOMING APPOINTMENT IN JULY TO RECEIVE ADDITIONAL REFILLS. THANK YOU! 30 capsule 1   spironolactone  (ALDACTONE ) 25 MG tablet Take 0.5 tablets (12.5 mg total) by mouth daily. PLEASE KEEP UPCOMING APPOINTMENT IN JULY TO RECEIVE ADDITIONAL REFILLS. THANK YOU! 45 tablet 1   No current facility-administered medications for this visit.  No Known Allergies  Social History   Socioeconomic History   Marital status: Married    Spouse name: [name]   Number of children: 1   Years of education: Not on file   Highest education level: Not on file  Occupational History   Occupation: Retired/ high school custodian  Tobacco Use   Smoking status: Never   Smokeless tobacco: Never  Vaping Use   Vaping status: Never Used  Substance and Sexual Activity   Alcohol use: No    Alcohol/week: 0.0 standard drinks of alcohol    Comment: rare   Drug use: No   Sexual activity: Not Currently  Other Topics Concern   Not on file  Social History Narrative   Lives with wife   Social Drivers of Health   Financial Resource Strain: Low Risk  (10/08/2023)   Overall Financial Resource Strain (CARDIA)    Difficulty of Paying Living  Expenses: Not hard at all  Food Insecurity: No Food Insecurity (10/08/2023)   Hunger Vital Sign    Worried About Running Out of Food in the Last Year: Never true    Ran Out of Food in the Last Year: Never true  Transportation Needs: No Transportation Needs (10/08/2023)   PRAPARE - Administrator, Civil Service (Medical): No    Lack of Transportation (Non-Medical): No  Physical Activity: Inactive (10/08/2023)   Exercise Vital Sign    Days of Exercise per Week: 0 days    Minutes of Exercise per Session: 0 min  Stress: No Stress Concern Present (10/08/2023)   [employer] of Occupational Health - Occupational Stress Questionnaire    Feeling of Stress : Not at all  Social Connections: Moderately Integrated (10/08/2023)   Social Connection and Isolation Panel    Frequency of Communication with Friends and Family: Twice a week    Frequency of Social Gatherings with Friends and Family: Three times a week    Attends Religious Services: More than 4 times per year    Active Member of Clubs or Organizations: No    Attends Banker Meetings: Never    Marital Status: Married  Catering manager Violence: Not At Risk (10/08/2023)   Humiliation, Afraid, Rape, and Kick questionnaire    Fear of Current or Ex-Partner: No    Emotionally Abused: No    Physically Abused: No    Sexually Abused: No    Family History  Problem Relation Age of Onset   Heart attack Father    Kidney cancer Mother    Colon cancer Neg Hx    Esophageal cancer Neg Hx    Pancreatic cancer Neg Hx    Prostate cancer Neg Hx    Rectal cancer Neg Hx    Stomach cancer Neg Hx     Review of Systems:  As stated in the HPI and otherwise negative.   There were no vitals taken for this visit.  Physical Examination: General: Well developed, well nourished, NAD  HEENT: OP clear, mucus membranes moist  SKIN: warm, dry. No rashes. Neuro: No focal deficits  Musculoskeletal: Muscle strength 5/5 all ext   Psychiatric: Mood and affect normal  Neck: No JVD, no carotid bruits, no thyromegaly, no lymphadenopathy.  Lungs:Clear bilaterally, no wheezes, rhonci, crackles Cardiovascular: Regular rate and rhythm. No murmurs, gallops or rubs. Abdomen:Soft. Bowel sounds present. Non-tender.  Extremities: No lower extremity edema. Pulses are 2 + in the bilateral DP/PT.  EKG:  EKG {ACTION; IS/IS WNU:[id number]} ordered today. The ekg ordered today  demonstrates ***  Recent Labs: 04/10/2024: ALT 26; BUN 15; Creatinine, Ser 0.94; Hemoglobin 14.5; Platelets 251.0; Potassium 4.0; Sodium 137   Lipid Panel    Component Value Date/Time   CHOL 106 04/10/2024 0958   CHOL 134 04/14/2022 0936   TRIG 118.0 04/10/2024 0958   HDL 35.70 (L) 04/10/2024 0958   HDL 37 (L) 04/14/2022 0936   CHOLHDL 3 04/10/2024 0958   VLDL 23.6 04/10/2024 0958   LDLCALC 47 04/10/2024 0958   LDLCALC 71 04/14/2022 0936     Wt Readings from Last 3 Encounters:  04/15/24 212 lb 6.4 oz (96.3 kg)  04/10/24 211 lb (95.7 kg)  10/11/23 212 lb (96.2 kg)      Assessment and Plan:   1. CAD s/p CABG: No chest pain. Continue ASA, statin and beta blocker.   2. Non-ischemic cardiomyopathy/Chronic systolic CHF: EF 54-49% in 2021. Wt is stable. NO volume overload on exam. Continue Coreg , Ramipril , aldactone  and Lasix .   3. Hyperlipidemia: LDL 47 in June 2025. Continue Lipitor.   4. HTN: BP is well controlled. Continue current therapy  5. Sleep apnea: *** CPAP  Labs/ tests ordered today include:  No orders of the defined types were placed in this encounter.    Disposition:   F/U with me in ***    Signed, Sean Cash, MD, [facility] 05/04/2024 10:22 AM    [facility] [address] [place], [place], [place]  [address] Phone: [phone]; Fax: [phone]

## 2024-05-05 ENCOUNTER — Encounter: Payer: Self-pay | Admitting: Cardiovascular Disease

## 2024-05-05 ENCOUNTER — Ambulatory Visit: Attending: Cardiovascular Disease | Admitting: Cardiovascular Disease

## 2024-05-05 VITALS — BP 118/72 | HR 59 | Ht 60.0 in | Wt 211.0 lb

## 2024-05-05 DIAGNOSIS — I351 Nonrheumatic aortic (valve) insufficiency: Secondary | ICD-10-CM

## 2024-05-05 DIAGNOSIS — I5022 Chronic systolic (congestive) heart failure: Secondary | ICD-10-CM

## 2024-05-05 DIAGNOSIS — I2510 Atherosclerotic heart disease of native coronary artery without angina pectoris: Secondary | ICD-10-CM

## 2024-05-05 DIAGNOSIS — I10 Essential (primary) hypertension: Secondary | ICD-10-CM | POA: Diagnosis not present

## 2024-05-05 DIAGNOSIS — E7800 Pure hypercholesterolemia, unspecified: Secondary | ICD-10-CM

## 2024-05-05 NOTE — Patient Instructions (Addendum)
 Medication Instructions:  No changes.  *If you need a refill on your cardiac medications before your next appointment, please call your pharmacy*  Testing/Procedures: Your physician has requested that you have an echocardiogram. Echocardiography is a painless test that uses sound waves to create images of your heart. It provides your doctor with information about the size and shape of your heart and how well your heart's chambers and valves are working. This procedure takes approximately one hour. There are no restrictions for this procedure. Please do NOT wear cologne, perfume, aftershave, or lotions (deodorant is allowed). Please arrive 15 minutes prior to your appointment time.  Please note: We ask at that you not bring children with you during ultrasound (echo/ vascular) testing. Due to room size and safety concerns, children are not allowed in the ultrasound rooms during exams. Our front office staff cannot provide observation of children in our lobby area while testing is being conducted. An adult accompanying a patient to their appointment will only be allowed in the ultrasound room at the discretion of the ultrasound technician under special circumstances. We apologize for any inconvenience.   Follow-Up: At [facility], you and your health needs are our priority.  As part of our continuing mission to provide you with exceptional heart care, our providers are all part of one team.  This team includes your primary Cardiologist (physician) and Advanced Practice Providers or APPs (Physician Assistants and Nurse Practitioners) who all work together to provide you with the care you need, when you need it.  Your next appointment:   1 year(s)  Provider:   Lonni Cash, MD    We recommend signing up for the patient portal called MyChart.  Sign up information is provided on this After Visit Summary.  MyChart is used to connect with patients for Virtual Visits (Telemedicine).   Patients are able to view lab/test results, encounter notes, upcoming appointments, etc.  Non-urgent messages can be sent to your provider as well.   To learn more about what you can do with MyChart, go to [email or web address].

## 2024-05-12 ENCOUNTER — Other Ambulatory Visit: Payer: Self-pay

## 2024-05-12 MED ORDER — CARVEDILOL 12.5 MG PO TABS: 12.5000 mg | ORAL_TABLET | Freq: Two times a day (BID) | ORAL | 3 refills | Status: AC

## 2024-05-12 MED ORDER — SPIRONOLACTONE 25 MG PO TABS: 12.5000 mg | ORAL_TABLET | Freq: Every day | ORAL | 3 refills | Status: AC

## 2024-05-12 MED ORDER — RAMIPRIL 5 MG PO CAPS: 5.0000 mg | ORAL_CAPSULE | Freq: Every day | ORAL | 3 refills | Status: AC

## 2024-05-12 MED ORDER — FUROSEMIDE 40 MG PO TABS
40.0000 mg | ORAL_TABLET | Freq: Every day | ORAL | 3 refills | Status: AC
Start: 2024-05-12 — End: ?

## 2024-05-12 MED ORDER — POTASSIUM CHLORIDE CRYS ER 20 MEQ PO TBCR: 20.0000 meq | EXTENDED_RELEASE_TABLET | Freq: Every day | ORAL | 3 refills | Status: AC

## 2024-05-12 MED ORDER — ATORVASTATIN CALCIUM 80 MG PO TABS: 80.0000 mg | ORAL_TABLET | Freq: Every day | ORAL | 3 refills | Status: AC

## 2024-06-17 ENCOUNTER — Ambulatory Visit (HOSPITAL_COMMUNITY)
Admission: RE | Admit: 2024-06-17 | Discharge: 2024-06-17 | Disposition: A | Discharge status: Home / Self Care | Source: Ambulatory Visit | Attending: Cardiovascular Disease | Admitting: Cardiovascular Disease

## 2024-06-17 DIAGNOSIS — I351 Nonrheumatic aortic (valve) insufficiency: Secondary | ICD-10-CM | POA: Diagnosis not present

## 2024-06-17 LAB — ECHOCARDIOGRAM COMPLETE
Area-P 1/2: 3.77 cm2
S' Lateral: 5.3 cm

## 2024-06-17 MED ORDER — PERFLUTREN LIPID MICROSPHERE
1.0000 mL | INTRAVENOUS | Status: AC | PRN
  Administered 2024-06-17: 2 mL via INTRAVENOUS

## 2024-06-18 ENCOUNTER — Ambulatory Visit: Payer: Self-pay | Admitting: Cardiovascular Disease

## 2024-10-08 ENCOUNTER — Ambulatory Visit: Payer: Medicare PPO

## 2024-10-08 VITALS — Ht 66.0 in | Wt 211.0 lb

## 2024-10-08 DIAGNOSIS — Z1159 Encounter for screening for other viral diseases: Secondary | ICD-10-CM

## 2024-10-08 DIAGNOSIS — Z0000 Encounter for general adult medical examination without abnormal findings: Secondary | ICD-10-CM | POA: Diagnosis not present

## 2024-10-08 NOTE — Progress Notes (Signed)
 Chief Complaint  Patient presents with   Medicare Wellness     Subjective:   [name] is a 68 y.o. male who presents for a Medicare Annual Wellness Visit.  Visit info / Clinical Intake: Medicare Wellness Visit Type:: Subsequent Annual Wellness Visit Persons participating in visit and providing information:: patient Medicare Wellness Visit Mode:: Telephone If telephone:: video declined Since this visit was completed virtually, some vitals may be partially provided or unavailable. Missing vitals are due to the limitations of the virtual format.: Documented vitals are patient reported If Telephone or Video please confirm:: I connected with patient using audio/video enable telemedicine. I verified patient identity with two identifiers, discussed telehealth limitations, and patient agreed to proceed. Patient Location:: Home Provider Location:: Office Interpreter Needed?: No Pre-visit prep was completed: yes AWV questionnaire completed by patient prior to visit?: no Living arrangements:: lives with spouse/significant other Patient's Overall Health Status Rating: good Typical amount of pain: none  Dietary Habits and Nutritional Risks How many meals a day?: 2 Eats fruit and vegetables daily?: yes Most meals are obtained by: preparing own meals; eating out In the last 2 weeks, have you had any of the following?: none Diabetic:: no  Functional Status Activities of Daily Living (to include ambulation/medication): Independent Ambulation: Independent with device- listed below Home Assistive Devices/Equipment: Eyeglasses Medication Administration: Independent Home Management (perform basic housework or laundry): Independent Manage your own finances?: yes Primary transportation is: driving Concerns about vision?: no *vision screening is required for WTM* Concerns about hearing?: no  Fall Screening Falls in the past year?: 0 Number of falls in past year: 0 Was there an injury  with Fall?: 0 Fall Risk Category Calculator: 0 Patient Fall Risk Level: Low Fall Risk  Fall Risk Patient at Risk for Falls Due to: No Fall Risks Fall risk Follow up: Falls evaluation completed; Falls prevention discussed  Home and Transportation Safety: All rugs have non-skid backing?: N/A, no rugs All stairs or steps have railings?: yes Grab bars in the bathtub or shower?: yes Have non-skid surface in bathtub or shower?: yes Good home lighting?: yes Regular seat belt use?: yes Hospital stays in the last year:: no  Cognitive Assessment Difficulty concentrating, remembering, or making decisions? : no Will 6CIT or Mini Cog be Completed: yes What year is it?: 0 points What month is it?: 0 points Give patient an address phrase to remember (5 components): [address] [place] About what time is it?: 0 points Count backwards from 20 to 1: 0 points Say the months of the year in reverse: 0 points Repeat the address phrase from earlier: 0 points 6 CIT Score: 0 points  Advance Directives (For Healthcare) Does Patient Have a Medical Advance Directive?: Yes Does patient want to make changes to medical advance directive?: Yes (Inpatient - patient requests chaplain consult to change a medical advance directive) Type of Advance Directive: Healthcare Power of [place]; Living will Copy of Healthcare Power of Attorney in Chart?: No - copy requested Copy of Living Will in Chart?: No - copy requested  Reviewed/Updated  Reviewed/Updated: Reviewed All (Medical, Surgical, Family, Medications, Allergies, Care Teams, Patient Goals)    Allergies (verified) Patient has no known allergies.   Current Medications (verified) Outpatient Encounter Medications as of 10/08/2024  Medication Sig   aspirin  EC 81 MG tablet Take 1 tablet (81 mg total) by mouth daily.   atorvastatin  (LIPITOR) 80 MG tablet Take 1 tablet (80 mg total) by mouth daily.   carvedilol  (COREG ) 12.5 MG  tablet Take 1 tablet  (12.5 mg total) by mouth 2 (two) times daily with a meal.   furosemide  (LASIX ) 40 MG tablet Take 1 tablet (40 mg total) by mouth daily.   meclizine  (ANTIVERT ) 25 MG tablet Take 1 tablet (25 mg total) by mouth Once PRN for up to 15 doses for dizziness.   Multiple Vitamin (MULTIVITAMIN WITH MINERALS) TABS tablet Take 1 tablet by mouth daily.   potassium chloride  SA (KLOR-CON  M) 20 MEQ tablet Take 1 tablet (20 mEq total) by mouth daily.   ramipril  (ALTACE ) 5 MG capsule Take 1 capsule (5 mg total) by mouth daily.   spironolactone  (ALDACTONE ) 25 MG tablet Take 0.5 tablets (12.5 mg total) by mouth daily.   No facility-administered encounter medications on file as of 10/08/2024.    History: Past Medical History:  Diagnosis Date   Allergic rhinitis    Allergy    Bradycardia    He has a hx of   Congestive heart failure (HCC)    EF equals 40%   Coronary artery disease    Status post previous anterior wall myocardial infarction.    Dyslipidemia    He has a hx   GERD (gastroesophageal reflux disease)    History of gastroesophageal reflux (GERD)    Hypercholesterolemia    He has a hx of    Hypertension    Myocardial infarction (HCC) 2008   OSA on CPAP    Sleep apnea    wears c-pap   Past Surgical History:  Procedure Laterality Date   APPENDECTOMY  Age 69   CARDIAC CATHETERIZATION     His EF is 40-45%. This has increased from 30% at the time of  last year(2008-9)   CORONARY ARTERY BYPASS GRAFT     KNEE SURGERY  2006   SHOULDER SURGERY     left   TONSILLECTOMY     US  ECHOCARDIOGRAPHY  10-25-2007   Est EF 35-40%   Family History  Problem Relation Age of Onset   Heart attack Father    Kidney cancer Mother    Colon cancer Neg Hx    Esophageal cancer Neg Hx    Pancreatic cancer Neg Hx    Prostate cancer Neg Hx    Rectal cancer Neg Hx    Stomach cancer Neg Hx    Social History   Occupational History   Occupation: Retired/ high school custodian  Tobacco Use   Smoking status:  Never   Smokeless tobacco: Never  Vaping Use   Vaping status: Never Used  Substance and Sexual Activity   Alcohol use: No    Alcohol/week: 0.0 standard drinks of alcohol    Comment: rare   Drug use: No   Sexual activity: Not Currently   Tobacco Counseling Counseling given: No  SDOH Screenings   Food Insecurity: No Food Insecurity (10/08/2024)  Housing: Unknown (10/08/2024)  Transportation Needs: No Transportation Needs (10/08/2024)  Utilities: Not At Risk (10/08/2024)  Alcohol Screen: Low Risk (10/08/2023)  Depression (PHQ2-9): Low Risk (10/08/2024)  Financial Resource Strain: Low Risk (10/08/2023)  Physical Activity: Insufficiently Active (10/08/2024)  Social Connections: Socially Integrated (10/08/2024)  Stress: No Stress Concern Present (10/08/2024)  Tobacco Use: Low Risk (10/08/2024)  Health Literacy: Adequate Health Literacy (10/08/2024)   See flowsheets for full screening details  Depression Screen PHQ 2 & 9 Depression Scale- Over the past 2 weeks, how often have you been bothered by any of the following problems? Little interest or pleasure in doing things: 0 Feeling down, depressed,  or hopeless (PHQ Adolescent also includes...irritable): 0 PHQ-2 Total Score: 0 Trouble falling or staying asleep, or sleeping too much: 0 Feeling tired or having little energy: 0 Poor appetite or overeating (PHQ Adolescent also includes...weight loss): 0 Feeling bad about yourself - or that you are a failure or have let yourself or your family down: 0 Trouble concentrating on things, such as reading the newspaper or watching television (PHQ Adolescent also includes...like school work): 0 Moving or speaking so slowly that other people could have noticed. Or the opposite - being so fidgety or restless that you have been moving around a lot more than usual: 0 Thoughts that you would be better off dead, or of hurting yourself in some way: 0 PHQ-9 Total Score: 0 If you checked off any  problems, how difficult have these problems made it for you to do your work, take care of things at home, or get along with other people?: Not difficult at all  Depression Treatment Depression Interventions/Treatment : EYV7-0 Score <4 Follow-up Not Indicated     Goals Addressed               This Visit's Progress     Patient Stated (pt-stated)        Patient stated he plans to walk more and manage diet             Objective:    Today's Vitals   10/08/24 1133  Weight: 211 lb (95.7 kg)  Height: 5' 6 (1.676 m)   Body mass index is 34.06 kg/m.  Hearing/Vision screen Hearing Screening - Comments:: Denies hearing difficulties   Vision Screening - Comments:: Wears eyeglasses for reading - up to date with routine eye exams with Dr Thedford Pan Immunizations and Health Maintenance Health Maintenance  Topic Date Due   Hepatitis C Screening  Never done   Pneumococcal Vaccine: 50+ Years (2 of 2 - PCV) 09/28/2017   COVID-19 Vaccine (4 - 2025-26 season) 06/23/2024   Medicare Annual Wellness (AWV)  10/08/2025   Colonoscopy  01/17/2026   DTaP/Tdap/Td (2 - Td or Tdap) 04/04/2032   Influenza Vaccine  Completed   Zoster Vaccines- Shingrix  Completed   Meningococcal B Vaccine  Aged Out        Assessment/Plan:  This is a routine wellness examination for Maahir.  Hepatitis C Screening: ordered today  Patient Care Team: Sagardia, Miguel Jose, MD as PCP - General (Internal Medicine) Verlin Lonni BIRCH, MD as PCP - Cardiology (Cardiology) Pan Thedford, OD as Referring Physician (Optometry)  I have personally reviewed and noted the following in the patients chart:   Medical and social history Use of alcohol, tobacco or illicit drugs  Current medications and supplements including opioid prescriptions. Functional ability and status Nutritional status Physical activity Advanced directives List of other physicians Hospitalizations, surgeries, and ER visits in  previous 12 months Vitals Screenings to include cognitive, depression, and falls Referrals and appointments  Orders Placed This Encounter  Procedures   Hepatitis C antibody    Standing Status:   Future    Expiration Date:   10/08/2025   In addition, I have reviewed and discussed with patient certain preventive protocols, quality metrics, and best practice recommendations. A written personalized care plan for preventive services as well as general preventive health recommendations were provided to patient.   Verdie CHRISTELLA Saba, CMA   10/08/2024   Return in 1 year (on 10/08/2025).  After Visit Summary: (Declined) Due to this being a telephonic visit, with patients  personalized plan was offered to patient but patient Declined AVS at this time   Nurse Notes: Appointment(s) made: (scheduled 6-mth f/u w/PCP; scheduled 2026 AWV/CPE appts)

## 2024-10-08 NOTE — Patient Instructions (Addendum)
 Mr. Ricciuti,  Thank you for taking the time for your Medicare Wellness Visit. I appreciate your continued commitment to your health goals. Please review the care plan we discussed, and feel free to reach out if I can assist you further.  Please note that Annual Wellness Visits do not include a physical exam. Some assessments may be limited, especially if the visit was conducted virtually. If needed, we may recommend an in-person follow-up with your provider.  Ongoing Care Seeing your primary care provider every 3 to 6 months helps us  monitor your health and provide consistent, personalized care.   Referrals If a referral was made during today's visit and you haven't received any updates within two weeks, please contact the referred provider directly to check on the status.  Recommended Screenings:  Health Maintenance  Topic Date Due   Hepatitis C Screening  Never done   Pneumococcal Vaccine for age over 39 (2 of 2 - PCV) 09/28/2017   COVID-19 Vaccine (4 - 2025-26 season) 06/23/2024   Medicare Annual Wellness Visit  10/08/2025   Colon Cancer Screening  01/17/2026   DTaP/Tdap/Td vaccine (2 - Td or Tdap) 04/04/2032   Flu Shot  Completed   Zoster (Shingles) Vaccine  Completed   Meningitis B Vaccine  Aged Out       10/08/2024   11:35 AM  Advanced Directives  Does Patient Have a Medical Advance Directive? Yes  Type of Estate Agent of [place];Living will  Does patient want to make changes to medical advance directive? Yes (Inpatient - patient requests chaplain consult to change a medical advance directive)  Copy of Healthcare Power of Attorney in Chart? No - copy requested    Vision: Annual vision screenings are recommended for early detection of glaucoma, cataracts, and diabetic retinopathy. These exams can also reveal signs of chronic conditions such as diabetes and high blood pressure.  Dental: Annual dental screenings help detect early signs of oral cancer,  gum disease, and other conditions linked to overall health, including heart disease and diabetes.

## 2024-10-13 ENCOUNTER — Ambulatory Visit: Admitting: Emergency Medicine

## 2024-10-28 ENCOUNTER — Ambulatory Visit: Admitting: Emergency Medicine

## 2024-10-28 ENCOUNTER — Encounter: Payer: Self-pay | Admitting: Emergency Medicine

## 2024-10-28 VITALS — BP 128/64 | HR 78 | Temp 97.7°F | Ht 66.0 in | Wt 217.0 lb

## 2024-10-28 DIAGNOSIS — I10 Essential (primary) hypertension: Secondary | ICD-10-CM | POA: Diagnosis not present

## 2024-10-28 DIAGNOSIS — I2510 Atherosclerotic heart disease of native coronary artery without angina pectoris: Secondary | ICD-10-CM

## 2024-10-28 DIAGNOSIS — G4733 Obstructive sleep apnea (adult) (pediatric): Secondary | ICD-10-CM

## 2024-10-28 DIAGNOSIS — I5022 Chronic systolic (congestive) heart failure: Secondary | ICD-10-CM

## 2024-10-28 DIAGNOSIS — E785 Hyperlipidemia, unspecified: Secondary | ICD-10-CM | POA: Diagnosis not present

## 2024-10-28 NOTE — Assessment & Plan Note (Signed)
 Clinically stable.  No recent anginal episodes. Continues daily baby aspirin 

## 2024-10-28 NOTE — Patient Instructions (Signed)
 Health Maintenance After Age 69 After age 27, you are at a higher risk for certain long-term diseases and infections as well as injuries from falls. Falls are a major cause of broken bones and head injuries in people who are older than age 73. Getting regular preventive care can help to keep you healthy and well. Preventive care includes getting regular testing and making lifestyle changes as recommended by your health care provider. Talk with your health care provider about: Which screenings and tests you should have. A screening is a test that checks for a disease when you have no symptoms. A diet and exercise plan that is right for you. What should I know about screenings and tests to prevent falls? Screening and testing are the best ways to find a health problem early. Early diagnosis and treatment give you the best chance of managing medical conditions that are common after age 90. Certain conditions and lifestyle choices may make you more likely to have a fall. Your health care provider may recommend: Regular vision checks. Poor vision and conditions such as cataracts can make you more likely to have a fall. If you wear glasses, make sure to get your prescription updated if your vision changes. Medicine review. Work with your health care provider to regularly review all of the medicines you are taking, including over-the-counter medicines. Ask your health care provider about any side effects that may make you more likely to have a fall. Tell your health care provider if any medicines that you take make you feel dizzy or sleepy. Strength and balance checks. Your health care provider may recommend certain tests to check your strength and balance while standing, walking, or changing positions. Foot health exam. Foot pain and numbness, as well as not wearing proper footwear, can make you more likely to have a fall. Screenings, including: Osteoporosis screening. Osteoporosis is a condition that causes  the bones to get weaker and break more easily. Blood pressure screening. Blood pressure changes and medicines to control blood pressure can make you feel dizzy. Depression screening. You may be more likely to have a fall if you have a fear of falling, feel depressed, or feel unable to do activities that you used to do. Alcohol  use screening. Using too much alcohol  can affect your balance and may make you more likely to have a fall. Follow these instructions at home: Lifestyle Do not drink alcohol  if: Your health care provider tells you not to drink. If you drink alcohol : Limit how much you have to: 0-1 drink a day for women. 0-2 drinks a day for men. Know how much alcohol  is in your drink. In the [place] one drink equals one 12 oz bottle of beer (355 mL), one 5 oz glass of wine (148 mL), or one 1 oz glass of hard liquor (44 mL). Do not use any products that contain nicotine or tobacco. These products include cigarettes, chewing tobacco, and vaping devices, such as e-cigarettes. If you need help quitting, ask your health care provider. Activity  Follow a regular exercise program to stay fit. This will help you maintain your balance. Ask your health care provider what types of exercise are appropriate for you. If you need a cane or walker, use it as recommended by your health care provider. Wear supportive shoes that have nonskid soles. Safety  Remove any tripping hazards, such as rugs, cords, and clutter. Install safety equipment such as grab bars in bathrooms and safety rails on stairs. Keep rooms and walkways  well-lit. General instructions Talk with your health care provider about your risks for falling. Tell your health care provider if: You fall. Be sure to tell your health care provider about all falls, even ones that seem minor. You feel dizzy, tiredness (fatigue), or off-balance. Take over-the-counter and prescription medicines only as told by your health care provider. These include  supplements. Eat a healthy diet and maintain a healthy weight. A healthy diet includes low-fat dairy products, low-fat (lean) meats, and fiber from whole grains, beans, and lots of fruits and vegetables. Stay current with your vaccines. Schedule regular health, dental, and eye exams. Summary Having a healthy lifestyle and getting preventive care can help to protect your health and wellness after age 15. Screening and testing are the best way to find a health problem early and help you avoid having a fall. Early diagnosis and treatment give you the best chance for managing medical conditions that are more common for people who are older than age 42. Falls are a major cause of broken bones and head injuries in people who are older than age 64. Take precautions to prevent a fall at home. Work with your health care provider to learn what changes you can make to improve your health and wellness and to prevent falls. This information is not intended to replace advice given to you by your health care provider. Make sure you discuss any questions you have with your health care provider. Document Revised: 02/28/2021 Document Reviewed: 02/28/2021 Elsevier Patient Education  2024 [employer].

## 2024-10-28 NOTE — Assessment & Plan Note (Signed)
 Diet and nutrition discussed.  Advised to decrease amount of daily carbohydrate intake and daily calories and increase amount of plant based protein in his diet

## 2024-10-28 NOTE — Assessment & Plan Note (Signed)
Chronic stable condition Continues atorvastatin 80 mg daily Diet and nutrition discussed

## 2024-10-28 NOTE — Assessment & Plan Note (Signed)
 BP Readings from Last 3 Encounters:  10/28/24 128/64  05/05/24 118/72  04/15/24 130/71  Well-controlled hypertension Continue ramipril  5 mg daily, Aldactone  12.5 mg daily, furosemide  40 mg daily and carvedilol  12.5 mg twice a day

## 2024-10-28 NOTE — Progress Notes (Signed)
 [name] 69 y.o.   Chief Complaint  Patient presents with   Follow-up    HISTORY OF PRESENT ILLNESS: This is a 69 y.o. male here for 44-month follow-up of multiple chronic medical conditions Overall doing well.  Has no complaints or medical concerns today.  HPI   Prior to Admission medications  Medication Sig Start Date End Date Taking? Authorizing Provider  aspirin  EC 81 MG tablet Take 1 tablet (81 mg total) by mouth daily. 08/17/14  Yes Nahser, Aleene PARAS, MD  atorvastatin  (LIPITOR) 80 MG tablet Take 1 tablet (80 mg total) by mouth daily. 05/12/24  Yes Verlin Lonni BIRCH, MD  carvedilol  (COREG ) 12.5 MG tablet Take 1 tablet (12.5 mg total) by mouth 2 (two) times daily with a meal. 05/12/24  Yes Verlin Lonni BIRCH, MD  furosemide  (LASIX ) 40 MG tablet Take 1 tablet (40 mg total) by mouth daily. 05/12/24  Yes Verlin Lonni BIRCH, MD  meclizine  (ANTIVERT ) 25 MG tablet Take 1 tablet (25 mg total) by mouth Once PRN for up to 15 doses for dizziness. 05/04/20  Yes Waylan Elsie PARAS, PA-C  Multiple Vitamin (MULTIVITAMIN WITH MINERALS) TABS tablet Take 1 tablet by mouth daily.   Yes [provider]  potassium chloride  SA (KLOR-CON  M) 20 MEQ tablet Take 1 tablet (20 mEq total) by mouth daily. 05/12/24  Yes Verlin Lonni BIRCH, MD  ramipril  (ALTACE ) 5 MG capsule Take 1 capsule (5 mg total) by mouth daily. 05/12/24  Yes Verlin Lonni BIRCH, MD  spironolactone  (ALDACTONE ) 25 MG tablet Take 0.5 tablets (12.5 mg total) by mouth daily. 05/12/24  Yes Verlin Lonni BIRCH, MD    Allergies[1]  Patient Active Problem List   Diagnosis Date Noted   Sensorineural hearing loss (SNHL) of left ear with unrestricted hearing of right ear 05/11/2021   Chronic systolic heart failure (HCC) 08/17/2014   ED (erectile dysfunction) of organic origin 10/21/2013   S/P CABG x 3 10/21/2013   Dyslipidemia 11/26/2007   Essential hypertension 11/26/2007   Coronary atherosclerosis  11/26/2007   Perennial allergic rhinitis with seasonal variation 11/26/2007   Obstructive sleep apnea syndrome 11/26/2007    Past Medical History:  Diagnosis Date   Allergic rhinitis    Allergy    Bradycardia    He has a hx of   Congestive heart failure (HCC)    EF equals 40%   Coronary artery disease    Status post previous anterior wall myocardial infarction.    Dyslipidemia    He has a hx   GERD (gastroesophageal reflux disease)    History of gastroesophageal reflux (GERD)    Hypercholesterolemia    He has a hx of    Hypertension    Myocardial infarction (HCC) 2008   OSA on CPAP    Sleep apnea    wears c-pap    Past Surgical History:  Procedure Laterality Date   APPENDECTOMY  Age 31   CARDIAC CATHETERIZATION     His EF is 40-45%. This has increased from 30% at the time of  last year(2008-9)   CORONARY ARTERY BYPASS GRAFT     KNEE SURGERY  2006   SHOULDER SURGERY     left   TONSILLECTOMY     US  ECHOCARDIOGRAPHY  10-25-2007   Est EF 35-40%    Social History   Socioeconomic History   Marital status: Married    Spouse name: [name]   Number of children: 1   Years of education: Not on file   Highest education  level: Not on file  Occupational History   Occupation: Retired/ high school custodian  Tobacco Use   Smoking status: Never   Smokeless tobacco: Never  Vaping Use   Vaping status: Never Used  Substance and Sexual Activity   Alcohol use: No    Alcohol/week: 0.0 standard drinks of alcohol    Comment: rare   Drug use: No   Sexual activity: Not Currently  Other Topics Concern   Not on file  Social History Narrative   Lives with wife   Social Drivers of Health   Tobacco Use: Low Risk (10/28/2024)   Patient History    Smoking Tobacco Use: Never    Smokeless Tobacco Use: Never    Passive Exposure: Not on file  Financial Resource Strain: Low Risk (10/08/2023)   Overall Financial Resource Strain (CARDIA)    Difficulty of Paying Living Expenses: Not  hard at all  Food Insecurity: No Food Insecurity (10/08/2024)   Epic    Worried About Radiation Protection Practitioner of Food in the Last Year: Never true    Ran Out of Food in the Last Year: Never true  Transportation Needs: No Transportation Needs (10/08/2024)   Epic    Lack of Transportation (Medical): No    Lack of Transportation (Non-Medical): No  Physical Activity: Insufficiently Active (10/08/2024)   Exercise Vital Sign    Days of Exercise per Week: 3 days    Minutes of Exercise per Session: 30 min  Stress: No Stress Concern Present (10/08/2024)   [employer] of Occupational Health - Occupational Stress Questionnaire    Feeling of Stress: Not at all  Social Connections: Socially Integrated (10/08/2024)   Social Connection and Isolation Panel    Frequency of Communication with Friends and Family: Twice a week    Frequency of Social Gatherings with Friends and Family: Three times a week    Attends Religious Services: More than 4 times per year    Active Member of Clubs or Organizations: Yes    Attends Banker Meetings: Never    Marital Status: Married  Catering Manager Violence: Not At Risk (10/08/2024)   Epic    Fear of Current or Ex-Partner: No    Emotionally Abused: No    Physically Abused: No    Sexually Abused: No  Depression (PHQ2-9): Low Risk (10/08/2024)   Depression (PHQ2-9)    PHQ-2 Score: 0  Alcohol Screen: Low Risk (10/08/2023)   Alcohol Screen    Last Alcohol Screening Score (AUDIT): 0  Housing: Unknown (10/08/2024)   Epic    Unable to Pay for Housing in the Last Year: No    Number of Times Moved in the Last Year: Not on file    Homeless in the Last Year: No  Utilities: Not At Risk (10/08/2024)   Epic    Threatened with loss of utilities: No  Health Literacy: Adequate Health Literacy (10/08/2024)   B1300 Health Literacy    Frequency of need for help with medical instructions: Never    Family History  Problem Relation Age of Onset   Heart attack  Father    Kidney cancer Mother    Colon cancer Neg Hx    Esophageal cancer Neg Hx    Pancreatic cancer Neg Hx    Prostate cancer Neg Hx    Rectal cancer Neg Hx    Stomach cancer Neg Hx      Review of Systems  Constitutional: Negative.  Negative for chills and fever.  HENT: Negative.  Negative  for congestion and sore throat.   Respiratory: Negative.  Negative for cough and shortness of breath.   Cardiovascular: Negative.  Negative for chest pain and palpitations.  Gastrointestinal:  Negative for abdominal pain, diarrhea, nausea and vomiting.  Genitourinary: Negative.  Negative for dysuria and hematuria.  Skin: Negative.  Negative for rash.  Neurological: Negative.  Negative for dizziness and headaches.  All other systems reviewed and are negative.   Vitals:   10/28/24 0904  BP: 128/64  Pulse: 78  Temp: 97.7 F (36.5 C)  SpO2: 97%    Physical Exam Vitals reviewed.  Constitutional:      Appearance: Normal appearance.  HENT:     Head: Normocephalic.     Mouth/Throat:     Mouth: Mucous membranes are moist.     Pharynx: Oropharynx is clear.  Eyes:     Extraocular Movements: Extraocular movements intact.     Conjunctiva/sclera: Conjunctivae normal.     Pupils: Pupils are equal, round, and reactive to light.  Cardiovascular:     Rate and Rhythm: Normal rate and regular rhythm.     Pulses: Normal pulses.     Heart sounds: Normal heart sounds.  Pulmonary:     Effort: Pulmonary effort is normal.     Breath sounds: Normal breath sounds.  Abdominal:     Palpations: Abdomen is soft.     Tenderness: There is no abdominal tenderness.  Musculoskeletal:     Cervical back: No tenderness.     Right lower leg: No edema.     Left lower leg: No edema.  Lymphadenopathy:     Cervical: No cervical adenopathy.  Skin:    General: Skin is warm and dry.     Capillary Refill: Capillary refill takes less than 2 seconds.  Neurological:     General: No focal deficit present.      Mental Status: He is alert and oriented to person, place, and time.  Psychiatric:        Mood and Affect: Mood normal.        Behavior: Behavior normal.      ASSESSMENT & PLAN: A total of 42 minutes was spent with the patient and counseling/coordination of care regarding preparing for this visit, review of most recent office visit notes, review of multiple chronic medical conditions and their management, review of all medications, review of most recent bloodwork results, review of health maintenance items, education on nutrition, prognosis, documentation, and need for follow up.   Problem List Items Addressed This Visit       Cardiovascular and Mediastinum   Essential hypertension   BP Readings from Last 3 Encounters:  10/28/24 128/64  05/05/24 118/72  04/15/24 130/71  Well-controlled hypertension Continue ramipril  5 mg daily, Aldactone  12.5 mg daily, furosemide  40 mg daily and carvedilol  12.5 mg twice a day       Coronary atherosclerosis   Clinically stable.  No recent anginal episodes. Continues daily baby aspirin       Chronic systolic heart failure (HCC) - Primary   Wt Readings from Last 3 Encounters:  10/28/24 217 lb (98.4 kg)  10/08/24 211 lb (95.7 kg)  05/05/24 211 lb (95.7 kg)  No signs or symptoms of acute congestive heart failure Not retaining fluid.  Euvolemic Continues furosemide  40 mg daily, ramipril  5 mg daily and Aldactone  12.5 mg daily Also on carvedilol  12.5 mg twice a day         Respiratory   Obstructive sleep apnea syndrome   Benefits from  CPAP and continues to use every night. Plan- continue auto 5-15        Other   Dyslipidemia   Chronic stable condition Continues atorvastatin  80 mg daily Diet and nutrition discussed      Obesity, morbid (HCC)   Diet and nutrition discussed Advised to decrease amount of daily carbohydrate intake and daily calories and increase amount of plant-based protein in his diet        Patient Instructions   Health Maintenance After Age 24 After age 36, you are at a higher risk for certain long-term diseases and infections as well as injuries from falls. Falls are a major cause of broken bones and head injuries in people who are older than age 52. Getting regular preventive care can help to keep you healthy and well. Preventive care includes getting regular testing and making lifestyle changes as recommended by your health care provider. Talk with your health care provider about: Which screenings and tests you should have. A screening is a test that checks for a disease when you have no symptoms. A diet and exercise plan that is right for you. What should I know about screenings and tests to prevent falls? Screening and testing are the best ways to find a health problem early. Early diagnosis and treatment give you the best chance of managing medical conditions that are common after age 20. Certain conditions and lifestyle choices may make you more likely to have a fall. Your health care provider may recommend: Regular vision checks. Poor vision and conditions such as cataracts can make you more likely to have a fall. If you wear glasses, make sure to get your prescription updated if your vision changes. Medicine review. Work with your health care provider to regularly review all of the medicines you are taking, including over-the-counter medicines. Ask your health care provider about any side effects that may make you more likely to have a fall. Tell your health care provider if any medicines that you take make you feel dizzy or sleepy. Strength and balance checks. Your health care provider may recommend certain tests to check your strength and balance while standing, walking, or changing positions. Foot health exam. Foot pain and numbness, as well as not wearing proper footwear, can make you more likely to have a fall. Screenings, including: Osteoporosis screening. Osteoporosis is a condition that causes  the bones to get weaker and break more easily. Blood pressure screening. Blood pressure changes and medicines to control blood pressure can make you feel dizzy. Depression screening. You may be more likely to have a fall if you have a fear of falling, feel depressed, or feel unable to do activities that you used to do. Alcohol use screening. Using too much alcohol can affect your balance and may make you more likely to have a fall. Follow these instructions at home: Lifestyle Do not drink alcohol if: Your health care provider tells you not to drink. If you drink alcohol: Limit how much you have to: 0-1 drink a day for women. 0-2 drinks a day for men. Know how much alcohol is in your drink. In the [place] one drink equals one 12 oz bottle of beer (355 mL), one 5 oz glass of wine (148 mL), or one 1 oz glass of hard liquor (44 mL). Do not use any products that contain nicotine or tobacco. These products include cigarettes, chewing tobacco, and vaping devices, such as e-cigarettes. If you need help quitting, ask your health care provider. Activity  Follow a regular exercise program to stay fit. This will help you maintain your balance. Ask your health care provider what types of exercise are appropriate for you. If you need a cane or walker, use it as recommended by your health care provider. Wear supportive shoes that have nonskid soles. Safety  Remove any tripping hazards, such as rugs, cords, and clutter. Install safety equipment such as grab bars in bathrooms and safety rails on stairs. Keep rooms and walkways well-lit. General instructions Talk with your health care provider about your risks for falling. Tell your health care provider if: You fall. Be sure to tell your health care provider about all falls, even ones that seem minor. You feel dizzy, tiredness (fatigue), or off-balance. Take over-the-counter and prescription medicines only as told by your health care provider. These include  supplements. Eat a healthy diet and maintain a healthy weight. A healthy diet includes low-fat dairy products, low-fat (lean) meats, and fiber from whole grains, beans, and lots of fruits and vegetables. Stay current with your vaccines. Schedule regular health, dental, and eye exams. Summary Having a healthy lifestyle and getting preventive care can help to protect your health and wellness after age 68. Screening and testing are the best way to find a health problem early and help you avoid having a fall. Early diagnosis and treatment give you the best chance for managing medical conditions that are more common for people who are older than age 69. Falls are a major cause of broken bones and head injuries in people who are older than age 17. Take precautions to prevent a fall at home. Work with your health care provider to learn what changes you can make to improve your health and wellness and to prevent falls. This information is not intended to replace advice given to you by your health care provider. Make sure you discuss any questions you have with your health care provider. Document Revised: 02/28/2021 Document Reviewed: 02/28/2021 Elsevier Patient Education  2024 [employer]     Emil Schaumann, MD [facility]    [1] No Known Allergies

## 2024-10-28 NOTE — Assessment & Plan Note (Signed)
Benefits from CPAP and continues to use every night. Plan- continue auto 5-15

## 2024-10-28 NOTE — Assessment & Plan Note (Signed)
 Wt Readings from Last 3 Encounters:  10/28/24 217 lb (98.4 kg)  10/08/24 211 lb (95.7 kg)  05/05/24 211 lb (95.7 kg)  No signs or symptoms of acute congestive heart failure Not retaining fluid.  Euvolemic Continues furosemide  40 mg daily, ramipril  5 mg daily and Aldactone  12.5 mg daily Also on carvedilol  12.5 mg twice a day

## 2025-04-27 ENCOUNTER — Ambulatory Visit: Admitting: Emergency Medicine

## 2025-10-15 ENCOUNTER — Ambulatory Visit

## 2025-10-15 ENCOUNTER — Encounter: Admitting: Emergency Medicine
